# Patient Record
Sex: Male | Born: 1990 | Race: Black or African American
Health system: Southern US, Community
[De-identification: ages and names within clinical notes are randomized; demographics above are authoritative.]

## PROBLEM LIST (undated history)

## (undated) DIAGNOSIS — F1020 Alcohol dependence, uncomplicated: Secondary | ICD-10-CM

## (undated) DIAGNOSIS — F319 Bipolar disorder, unspecified: Secondary | ICD-10-CM

## (undated) DIAGNOSIS — Z9049 Acquired absence of other specified parts of digestive tract: Secondary | ICD-10-CM

## (undated) DIAGNOSIS — S069XAA Unspecified intracranial injury with loss of consciousness status unknown, initial encounter: Secondary | ICD-10-CM

## (undated) DIAGNOSIS — S069X9A Unspecified intracranial injury with loss of consciousness of unspecified duration, initial encounter: Secondary | ICD-10-CM

## (undated) HISTORY — PX: APPENDECTOMY (OPEN): SHX54

## (undated) HISTORY — PX: FACIAL FRACTURE SURGERY: SHX1570

---

## 2011-08-31 DIAGNOSIS — S0291XA Unspecified fracture of skull, initial encounter for closed fracture: Secondary | ICD-10-CM

## 2011-08-31 HISTORY — DX: Unspecified fracture of skull, initial encounter for closed fracture: S02.91XA

## 2012-04-30 DIAGNOSIS — S0990XS Unspecified injury of head, sequela: Secondary | ICD-10-CM

## 2012-04-30 DIAGNOSIS — H9192 Unspecified hearing loss, left ear: Secondary | ICD-10-CM

## 2012-04-30 DIAGNOSIS — S0292XA Unspecified fracture of facial bones, initial encounter for closed fracture: Secondary | ICD-10-CM

## 2012-04-30 DIAGNOSIS — J939 Pneumothorax, unspecified: Secondary | ICD-10-CM

## 2012-04-30 HISTORY — DX: Unspecified hearing loss, left ear: H91.92

## 2012-04-30 HISTORY — DX: Pneumothorax, unspecified: J93.9

## 2012-04-30 HISTORY — DX: Unspecified injury of head, sequela: S09.90XS

## 2012-04-30 HISTORY — DX: Unspecified fracture of facial bones, initial encounter for closed fracture: S02.92XA

## 2016-04-24 ENCOUNTER — Inpatient Hospital Stay: Payer: BLUE CROSS/BLUE SHIELD | Admitting: Internal Medicine

## 2016-04-24 ENCOUNTER — Emergency Department: Payer: BLUE CROSS/BLUE SHIELD

## 2016-04-24 ENCOUNTER — Inpatient Hospital Stay
Admission: EM | Admit: 2016-04-24 | Discharge: 2016-04-26 | DRG: 558 | Disposition: A | Discharge status: Home / Self Care | Payer: BLUE CROSS/BLUE SHIELD | Attending: Internal Medicine | Admitting: Internal Medicine

## 2016-04-24 DIAGNOSIS — F1290 Cannabis use, unspecified, uncomplicated: Secondary | ICD-10-CM | POA: Diagnosis present

## 2016-04-24 DIAGNOSIS — F17210 Nicotine dependence, cigarettes, uncomplicated: Secondary | ICD-10-CM | POA: Diagnosis present

## 2016-04-24 DIAGNOSIS — H9192 Unspecified hearing loss, left ear: Secondary | ICD-10-CM | POA: Diagnosis present

## 2016-04-24 DIAGNOSIS — I10 Essential (primary) hypertension: Secondary | ICD-10-CM | POA: Diagnosis present

## 2016-04-24 DIAGNOSIS — E872 Acidosis, unspecified: Secondary | ICD-10-CM

## 2016-04-24 DIAGNOSIS — F319 Bipolar disorder, unspecified: Secondary | ICD-10-CM | POA: Diagnosis present

## 2016-04-24 DIAGNOSIS — G43A0 Cyclical vomiting, not intractable: Secondary | ICD-10-CM | POA: Diagnosis present

## 2016-04-24 DIAGNOSIS — E860 Dehydration: Secondary | ICD-10-CM | POA: Diagnosis present

## 2016-04-24 DIAGNOSIS — R634 Abnormal weight loss: Secondary | ICD-10-CM | POA: Diagnosis present

## 2016-04-24 DIAGNOSIS — D72829 Elevated white blood cell count, unspecified: Secondary | ICD-10-CM | POA: Diagnosis present

## 2016-04-24 DIAGNOSIS — K5900 Constipation, unspecified: Secondary | ICD-10-CM | POA: Diagnosis not present

## 2016-04-24 DIAGNOSIS — Z87820 Personal history of traumatic brain injury: Secondary | ICD-10-CM

## 2016-04-24 DIAGNOSIS — M6282 Rhabdomyolysis: Principal | ICD-10-CM | POA: Diagnosis present

## 2016-04-24 DIAGNOSIS — N179 Acute kidney failure, unspecified: Secondary | ICD-10-CM | POA: Diagnosis present

## 2016-04-24 HISTORY — DX: Bipolar disorder, unspecified: F31.9

## 2016-04-24 LAB — COMPREHENSIVE METABOLIC PANEL
ALT: 35 U/L (ref 0–55)
AST (SGOT): 37 U/L — ABNORMAL HIGH (ref 5–34)
Albumin/Globulin Ratio: 1.3 (ref 0.9–2.2)
Albumin: 4.9 g/dL (ref 3.5–5.0)
Alkaline Phosphatase: 64 U/L (ref 38–106)
Anion Gap: 23 — ABNORMAL HIGH (ref 5.0–15.0)
BUN: 54 mg/dL — ABNORMAL HIGH (ref 9.0–28.0)
Bilirubin, Total: 2 mg/dL — ABNORMAL HIGH (ref 0.2–1.2)
CO2: 19 mEq/L — ABNORMAL LOW (ref 22–29)
Calcium: 10.4 mg/dL (ref 8.5–10.5)
Chloride: 99 mEq/L — ABNORMAL LOW (ref 100–111)
Creatinine: 8.7 mg/dL — ABNORMAL HIGH (ref 0.7–1.3)
Globulin: 3.9 g/dL — ABNORMAL HIGH (ref 2.0–3.6)
Glucose: 127 mg/dL — ABNORMAL HIGH (ref 70–100)
Potassium: 3.8 mEq/L (ref 3.5–5.1)
Protein, Total: 8.8 g/dL — ABNORMAL HIGH (ref 6.0–8.3)
Sodium: 141 mEq/L (ref 136–145)

## 2016-04-24 LAB — CBC AND DIFFERENTIAL
Absolute NRBC: 0 10*3/uL
Basophils Absolute Automated: 0.05 10*3/uL (ref 0.00–0.20)
Basophils Automated: 0.2 %
Eosinophils Absolute Automated: 0 10*3/uL (ref 0.00–0.70)
Eosinophils Automated: 0 %
Hematocrit: 49.3 % (ref 42.0–52.0)
Hgb: 17.5 g/dL — ABNORMAL HIGH (ref 13.0–17.0)
Immature Granulocytes Absolute: 0.19 10*3/uL — ABNORMAL HIGH
Immature Granulocytes: 0.7 %
Lymphocytes Absolute Automated: 1.53 10*3/uL (ref 0.50–4.40)
Lymphocytes Automated: 5.6 %
MCH: 30.6 pg (ref 28.0–32.0)
MCHC: 35.5 g/dL (ref 32.0–36.0)
MCV: 86.3 fL (ref 80.0–100.0)
MPV: 11.2 fL (ref 9.4–12.3)
Monocytes Absolute Automated: 1.64 10*3/uL — ABNORMAL HIGH (ref 0.00–1.20)
Monocytes: 6 %
Neutrophils Absolute: 23.9 10*3/uL — ABNORMAL HIGH (ref 1.80–8.10)
Neutrophils: 87.5 %
Nucleated RBC: 0 /100 WBC (ref 0.0–1.0)
Platelets: 297 10*3/uL (ref 140–400)
RBC: 5.71 10*6/uL (ref 4.70–6.00)
RDW: 13 % (ref 12–15)
WBC: 27.31 10*3/uL — ABNORMAL HIGH (ref 3.50–10.80)

## 2016-04-24 LAB — URINALYSIS, REFLEX TO MICROSCOPIC EXAM IF INDICATED
Bilirubin, UA: NEGATIVE
Glucose, UA: NEGATIVE
Ketones UA: NEGATIVE
Leukocyte Esterase, UA: NEGATIVE
Nitrite, UA: NEGATIVE
Protein, UR: 100 — AB
Specific Gravity UA: 1.017 (ref 1.001–1.035)
Urine pH: 5 (ref 5.0–8.0)
Urobilinogen, UA: NEGATIVE mg/dL

## 2016-04-24 LAB — URINALYSIS WITH MICROSCOPIC
Bilirubin, UA: NEGATIVE
Glucose, UA: 50 — AB
Ketones UA: NEGATIVE
Leukocyte Esterase, UA: NEGATIVE
Nitrite, UA: NEGATIVE
Protein, UR: 30 — AB
Specific Gravity UA: 1.016 (ref 1.001–1.035)
Urine pH: 5 (ref 5.0–8.0)
Urobilinogen, UA: NEGATIVE mg/dL

## 2016-04-24 LAB — RAPID DRUG SCREEN, URINE
Barbiturate Screen, UR: NEGATIVE
Benzodiazepine Screen, UR: NEGATIVE
Cannabinoid Screen, UR: POSITIVE — AB
Cocaine, UR: NEGATIVE
Opiate Screen, UR: POSITIVE — AB
PCP Screen, UR: NEGATIVE
Urine Amphetamine Screen: NEGATIVE

## 2016-04-24 LAB — SODIUM, URINE, RANDOM: Urine Sodium Random: 50 mEq/L

## 2016-04-24 LAB — BLOOD GAS, ARTERIAL
Arterial Total CO2: 18.5 mEq/L — ABNORMAL LOW (ref 24.0–30.0)
Base Excess, Arterial: -4.1 mEq/L — ABNORMAL LOW (ref <=2.0)
FIO2: 21 %
HCO3, Arterial: 21 mEq/L — ABNORMAL LOW (ref 23.0–29.0)
O2 Sat, Arterial: 96.8 % (ref 95.0–100.0)
Temperature: 37
pCO2, Arterial: 40.8 mmhg (ref 35.0–45.0)
pH, Arterial: 7.332 — ABNORMAL LOW (ref 7.350–7.450)
pO2, Arterial: 98.1 mmhg — ABNORMAL HIGH (ref 80.0–90.0)

## 2016-04-24 LAB — ETHANOL: Alcohol: NOT DETECTED mg/dL

## 2016-04-24 LAB — ECG 12-LEAD
Atrial Rate: 117 {beats}/min
P Axis: 81 degrees
P-R Interval: 116 ms
Q-T Interval: 354 ms
QRS Duration: 108 ms
QTC Calculation (Bezet): 493 ms
R Axis: 99 degrees
T Axis: 57 degrees
Ventricular Rate: 117 {beats}/min

## 2016-04-24 LAB — TSH
TSH: 0.93 u[IU]/mL (ref 0.35–4.94)
TSH: 0.97 u[IU]/mL (ref 0.35–4.94)

## 2016-04-24 LAB — C4 COMPLEMENT: C4 Complement: 26 mg/dL (ref 15.0–53.0)

## 2016-04-24 LAB — HEMOLYSIS INDEX
Hemolysis Index: 7 (ref 0–18)
Hemolysis Index: 21 — ABNORMAL HIGH (ref 0–18)

## 2016-04-24 LAB — C3 COMPLEMENT: C3 Complement: 115 mg/dL (ref 82–185)

## 2016-04-24 LAB — CK: Creatine Kinase (CK): 1773 U/L — ABNORMAL HIGH (ref 47–267)

## 2016-04-24 LAB — LIPASE: Lipase: 25 U/L (ref 8–78)

## 2016-04-24 LAB — PROTEIN / CREATININE RATIO, URINE
Urine Creatinine, Random: 210.2 mg/dL
Urine Protein Random: 25 mg/dL — ABNORMAL HIGH (ref 1.0–14.0)
Urine Protein/Creatinine Ratio: 0.1

## 2016-04-24 LAB — C-REACTIVE PROTEIN: C-Reactive Protein: 0.2 mg/dL (ref 0.0–0.8)

## 2016-04-24 LAB — GFR: EGFR: 9.1

## 2016-04-24 LAB — LACTIC ACID, PLASMA
Lactic Acid: 2.5 mmol/L — ABNORMAL HIGH (ref 0.2–2.0)
Lactic Acid: 0.9 mmol/L (ref 0.2–2.0)

## 2016-04-24 MED ORDER — SERTRALINE HCL 50 MG PO TABS
100.0000 mg | ORAL_TABLET | Freq: Every day | ORAL | Status: DC
Start: 2016-04-24 — End: 2016-04-26
  Administered 2016-04-24 – 2016-04-26 (×3): 100 mg via ORAL
  Filled 2016-04-24 (×3): qty 2

## 2016-04-24 MED ORDER — ONDANSETRON HCL 4 MG/2ML IJ SOLN
4.0000 mg | Freq: Once | INTRAMUSCULAR | Status: AC
Start: 2016-04-24 — End: 2016-04-24
  Administered 2016-04-24: 4 mg via INTRAVENOUS
  Filled 2016-04-24: qty 2

## 2016-04-24 MED ORDER — HYDRALAZINE HCL 20 MG/ML IJ SOLN
5.0000 mg | Freq: Three times a day (TID) | INTRAMUSCULAR | Status: DC | PRN
Start: 2016-04-24 — End: 2016-04-26

## 2016-04-24 MED ORDER — NALOXONE HCL 0.4 MG/ML IJ SOLN (WRAP)
0.2000 mg | INTRAMUSCULAR | Status: DC | PRN
Start: 2016-04-24 — End: 2016-04-26

## 2016-04-24 MED ORDER — MORPHINE SULFATE 15 MG PO TABS
15.0000 mg | ORAL_TABLET | ORAL | Status: DC | PRN
Start: 2016-04-24 — End: 2016-04-25
  Administered 2016-04-24 – 2016-04-25 (×3): 15 mg via ORAL
  Filled 2016-04-24 (×4): qty 1

## 2016-04-24 MED ORDER — SODIUM CHLORIDE 0.9 % IV BOLUS
30.0000 mL/kg | Freq: Once | INTRAVENOUS | Status: AC
Start: 2016-04-24 — End: 2016-04-24
  Administered 2016-04-24: 1584 mL via INTRAVENOUS

## 2016-04-24 MED ORDER — PROMETHAZINE HCL 25 MG/ML IJ SOLN
12.5000 mg | Freq: Four times a day (QID) | INTRAMUSCULAR | Status: DC | PRN
Start: 2016-04-24 — End: 2016-04-26

## 2016-04-24 MED ORDER — HEPARIN SODIUM (PORCINE) 5000 UNIT/ML IJ SOLN
5000.0000 [IU] | Freq: Two times a day (BID) | INTRAMUSCULAR | Status: DC
Start: 2016-04-24 — End: 2016-04-26
  Administered 2016-04-24 – 2016-04-26 (×5): 5000 [IU] via SUBCUTANEOUS
  Filled 2016-04-24 (×5): qty 1

## 2016-04-24 MED ORDER — PROMETHAZINE HCL 25 MG/ML IJ SOLN
12.5000 mg | Freq: Once | INTRAMUSCULAR | Status: AC
Start: 2016-04-24 — End: 2016-04-24
  Administered 2016-04-24: 12.5 mg via INTRAVENOUS
  Filled 2016-04-24: qty 1

## 2016-04-24 MED ORDER — QUETIAPINE FUMARATE 25 MG PO TABS
25.0000 mg | ORAL_TABLET | Freq: Every evening | ORAL | Status: DC
Start: 2016-04-24 — End: 2016-04-26
  Administered 2016-04-24 – 2016-04-25 (×2): 25 mg via ORAL
  Filled 2016-04-24 (×3): qty 1

## 2016-04-24 MED ORDER — HALOPERIDOL LACTATE 5 MG/ML IJ SOLN
2.0000 mg | Freq: Once | INTRAMUSCULAR | Status: AC
Start: 2016-04-24 — End: 2016-04-24
  Administered 2016-04-24: 2 mg via INTRAVENOUS
  Filled 2016-04-24: qty 1

## 2016-04-24 MED ORDER — ONDANSETRON HCL 4 MG/2ML IJ SOLN
4.0000 mg | Freq: Four times a day (QID) | INTRAMUSCULAR | Status: DC | PRN
Start: 2016-04-24 — End: 2016-04-26
  Administered 2016-04-25 – 2016-04-26 (×5): 4 mg via INTRAVENOUS
  Filled 2016-04-24 (×5): qty 2

## 2016-04-24 MED ORDER — METRONIDAZOLE IN NACL 500 MG/100 ML IV SOLN
500.0000 mg | Freq: Once | INTRAVENOUS | Status: AC
Start: 2016-04-24 — End: 2016-04-24
  Administered 2016-04-24: 500 mg via INTRAVENOUS
  Filled 2016-04-24: qty 100

## 2016-04-24 MED ORDER — ONDANSETRON HCL 4 MG/2ML IJ SOLN
4.0000 mg | Freq: Three times a day (TID) | INTRAMUSCULAR | Status: DC | PRN
Start: 2016-04-24 — End: 2016-04-24
  Administered 2016-04-24: 4 mg via INTRAVENOUS
  Filled 2016-04-24: qty 2

## 2016-04-24 MED ORDER — SODIUM CHLORIDE 0.9 % IV BOLUS
1000.0000 mL | Freq: Once | INTRAVENOUS | Status: AC
Start: 2016-04-24 — End: 2016-04-24
  Administered 2016-04-24: 1000 mL via INTRAVENOUS

## 2016-04-24 MED ORDER — SODIUM CHLORIDE 0.9 % IV SOLN
INTRAVENOUS | Status: DC
Start: 2016-04-24 — End: 2016-04-26

## 2016-04-24 MED ORDER — MORPHINE SULFATE 2 MG/ML IJ/IV SOLN (WRAP)
2.0000 mg | Status: DC | PRN
Start: 2016-04-24 — End: 2016-04-25
  Administered 2016-04-25 (×2): 2 mg via INTRAVENOUS
  Filled 2016-04-24 (×2): qty 1

## 2016-04-24 MED ORDER — MORPHINE SULFATE 4 MG/ML IJ/IV SOLN (WRAP)
4.0000 mg | Freq: Once | Status: AC
Start: 2016-04-24 — End: 2016-04-24
  Administered 2016-04-24: 4 mg via INTRAVENOUS
  Filled 2016-04-24: qty 1

## 2016-04-24 MED ORDER — LEVOFLOXACIN IN D5W 500 MG/100ML IV SOLN
500.0000 mg | Freq: Once | INTRAVENOUS | Status: AC
Start: 2016-04-24 — End: 2016-04-24
  Administered 2016-04-24: 500 mg via INTRAVENOUS
  Filled 2016-04-24: qty 100

## 2016-04-24 MED ORDER — ENOXAPARIN SODIUM 30 MG/0.3ML SC SOLN
30.0000 mg | Freq: Every day | SUBCUTANEOUS | Status: DC
Start: 2016-04-24 — End: 2016-04-24
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
## 2016-04-24 NOTE — ED Notes (Signed)
Pt comes in c/o V/N/D abd pain and epigastric pain that has been going on for few days.  Pt was just [place]'d from [place] but feels worse than when he went in.  Pt obs very weak unsteady and anxious.  Pt vomited during triage and it was pure bile.  States that that is what he has been doing all this time.
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
## 2016-04-24 NOTE — ED Notes (Addendum)
NURSING NOTE FOR THE RECEIVING INPATIENT NURSE     ED NURSE: Paralee Cancel NUMBER: [id number]   SPECTRA LINK NUMBER:  Admission Information      Joel [name]. is a 25 y.o. male admitted with a diagnosis of:    1. Non-traumatic rhabdomyolysis    2. Acute kidney injury    3. Dehydration    4. Lactic acidosis    5. Leukocytosis, unspecified type         Nursing Care      Level of Consciousness: Pt is A&O x4, although slightly confused when waking up.     ADL: Pt needs assistance toilleting. Mom is at the bedside.       Latest Vital Signs     Filed Vitals:    04/24/16 0910   BP: 162/91   Pulse: 88   Temp: 98.7 F (37.1 C)   Resp: 20   SpO2: 100%        IV Lines     Peripheral IV 04/24/16 Left Antecubital (Active)   Site Assessment Clean;Dry;Intact 04/24/2016  5:50 AM   Line Status Saline Locked;Flushed 04/24/2016  5:50 AM   Dressing Status Clean;Dry;Intact 04/24/2016  5:50 AM   Number of days:0       Peripheral IV 04/24/16 Right Antecubital (Active)   Site Assessment Clean;Dry;Intact 04/24/2016  6:21 AM   Line Status Saline Locked;Flushed 04/24/2016  6:21 AM   Dressing Status Clean;Intact;Dry 04/24/2016  6:21 AM   Number of days:0           Lab- Results     Labs Reviewed   CBC AND DIFFERENTIAL - Abnormal; Notable for the following:     WBC 27.31 (*)     Hgb 17.5 (*)     Neutrophils Absolute 23.90 (*)     Abs Mono Automated 1.64 (*)     Absolute Immature Granulocyte 0.19 (*)     All other components within normal limits   COMPREHENSIVE METABOLIC PANEL - Abnormal; Notable for the following:     Glucose 127 (*)     BUN 54.0 (*)     Creatinine 8.7 (*)     Chloride 99 (*)     CO2 19 (*)     Protein, Total 8.8 (*)     AST (SGOT) 37 (*)     Bilirubin, Total 2.0 (*)     Globulin 3.9 (*)     Anion Gap 23.0 (*)     All other components within normal limits   LACTIC ACID, PLASMA - Abnormal; Notable for the following:     Lactic acid 2.5 (*)     All other components within normal limits   URINALYSIS, REFLEX TO MICROSCOPIC EXAM IF  INDICATED - Abnormal; Notable for the following:     Color, UA Amber (*)     Clarity, UA Sl Cloudy (*)     Protein, UR 100 (*)     Blood, UA Moderate (*)     RBC, UA 11 - 25 (*)     Hyaline Casts, UA TNTC (*)     All other components within normal limits   RAPID DRUG SCREEN, URINE - Abnormal; Notable for the following:     Cannabinoid Screen, UR Positive (*)     Opiate Screen, UR Positive (*)     All other components within normal limits   BLOOD GAS, ARTERIAL - Abnormal; Notable for the following:     pH, Arterial 7.332 (*)  pO2, Arterial 98.1 (*)     HCO3, Arterial 21.0 (*)     Arterial Total CO2 18.5 (*)     Base Excess, Arterial -4.1 (*)     All other components within normal limits   CK - Abnormal; Notable for the following:     Creatine Kinase (CK) 1773 (*)     All other components within normal limits   HEMOLYSIS INDEX - Abnormal; Notable for the following:     Hemolysis Index 21 (*)     All other components within normal limits   CULTURE BLOOD AEROBIC AND ANAEROBIC    Narrative:     1 BLUE+1 PURPLE   URINE CULTURE   LIPASE   HEMOLYSIS INDEX   GFR   TSH   ETHANOL   LACTIC ACID, PLASMA   URINALYSIS WITH MICROSCOPIC   SODIUM, URINE, RANDOM   C3 COMPLEMENT   C4 COMPLEMENT   C-REACTIVE PROTEIN
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
## 2016-04-24 NOTE — Plan of Care (Signed)
Problem: Safety  Goal: Patient will be free from injury during hospitalization  Outcome: Progressing  High risk for falls, recent fall at home, steady gait, I person standby assistance, Safety/fall precautions in place, bed alarm in place, call bell at reach, encouraged to call for assistance at all times.  Will continue to monitor.    Problem: Pain  Goal: Pain at adequate level as identified by patient  Outcome: Progressing  C/o of mid sternal chest pain, morphine given with some relieve.  Will continue to monitor.     Comments:   The learning abilities of the patient and caregiver have been assessed. Today's individualized plan of care includes pain management, continue IV fluids for hydration,and safety precautions. The plan of care was discussed with the patient and caregiver, who agree to it and demonstrate understanding of the disease process, treatment plan, medications and consequences of noncompliance. All questions and concerns were addressed.  Safety maintained.  Will continue to monitor.

## 2016-04-24 NOTE — Consults (Signed)
Spoke to ER MD-  Reviewed labs remotely.   AKI with leukocytosis . Etiology - Pre-renal clinically but hematuria/proteinuria is suspicious and GN needs to be ruled out. No hydronephrosis on CT scan - Unlikely to be Rhabdomyolysis (CPK usually is more than 10 K to affect GFR - some reports at CPK 5 K as well esp., with underlying muscle diseases)   GI symptoms with some wt loss.   Lactic acidosis - mild with incRease AG   ? Polycythemia - likely volume depletion (hemoconcentration)       REC    Repeat UA  Check urine electrolytes  Check complements and CRP   IVF. Aggressive   Renal panel daily   Full note to follow once I see the patient.

## 2016-04-25 LAB — CBC
Absolute NRBC: 0 10*3/uL
Hematocrit: 43.3 % (ref 42.0–52.0)
Hgb: 14.6 g/dL (ref 13.0–17.0)
MCH: 31.1 pg (ref 28.0–32.0)
MCHC: 33.7 g/dL (ref 32.0–36.0)
MCV: 92.1 fL (ref 80.0–100.0)
MPV: 11.7 fL (ref 9.4–12.3)
Nucleated RBC: 0 /100 WBC (ref 0.0–1.0)
Platelets: 225 10*3/uL (ref 140–400)
RBC: 4.7 10*6/uL (ref 4.70–6.00)
RDW: 14 % (ref 12–15)
WBC: 18.19 10*3/uL — ABNORMAL HIGH (ref 3.50–10.80)

## 2016-04-25 LAB — GFR: EGFR: 42.2

## 2016-04-25 LAB — BASIC METABOLIC PANEL
Anion Gap: 8 (ref 5.0–15.0)
BUN: 32 mg/dL — ABNORMAL HIGH (ref 9.0–28.0)
CO2: 25 mEq/L (ref 22–29)
Calcium: 9.2 mg/dL (ref 8.5–10.5)
Chloride: 108 mEq/L (ref 100–111)
Creatinine: 2.3 mg/dL — ABNORMAL HIGH (ref 0.7–1.3)
Glucose: 91 mg/dL (ref 70–100)
Potassium: 4.1 mEq/L (ref 3.5–5.1)
Sodium: 141 mEq/L (ref 136–145)

## 2016-04-25 LAB — HEMOLYSIS INDEX: Hemolysis Index: 5 (ref 0–18)

## 2016-04-25 MED ORDER — MORPHINE SULFATE 2 MG/ML IJ/IV SOLN (WRAP)
1.0000 mg | Status: DC | PRN
Start: 2016-04-25 — End: 2016-04-26

## 2016-04-25 MED ORDER — MORPHINE SULFATE 15 MG PO TABS
15.0000 mg | ORAL_TABLET | ORAL | Status: DC | PRN
Start: 2016-04-25 — End: 2016-04-26
  Administered 2016-04-25 – 2016-04-26 (×2): 15 mg via ORAL
  Filled 2016-04-25 (×2): qty 1
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
## 2016-04-25 NOTE — Plan of Care (Signed)
Problem: Safety  Goal: Patient will be free from injury during hospitalization  Outcome: Progressing    04/25/16 0506   Goal/Interventions addressed this shift   Patient will be free from injury during hospitalization  Assess patient's risk for falls and implement fall prevention plan of care per policy;Provide and maintain safe environment;Use appropriate transfer methods;Ensure appropriate safety devices are available at the bedside;Include patient/ family/ care giver in decisions related to safety;Hourly rounding;Provide alternative method of communication if needed (communication boards, writing)         Problem: Pain  Goal: Pain at adequate level as identified by patient  Outcome: Progressing    04/25/16 0506   Goal/Interventions addressed this shift   Pain at adequate level as identified by patient Identify patient comfort function goal;Assess for risk of opioid induced respiratory depression, including snoring/sleep apnea. Alert healthcare team of risk factors identified.;Assess pain on admission, during daily assessment and/or before any "as needed" intervention(s);Reassess pain within 30-60 minutes of any procedure/intervention, per Pain Assessment, Intervention, Reassessment (AIR) Cycle;Evaluate if patient comfort function goal is met;Evaluate patient's satisfaction with pain management progress;Offer non-pharmacological pain management interventions;Include patient/patient care companion in decisions related to pain management as needed         Problem: Altered GI Function  Goal: Fluid and electrolyte balance are achieved/maintained  Outcome: Progressing    04/25/16 0506   Goal/Interventions addressed this shift   Fluid and electrolyte balance are achieved/maintained Monitor intake and output every shift;Monitor/assess lab values and report abnormal values;Provide adequate hydration;Monitor daily weight;Assess and reassess fluid and electrolyte status;Monitor for muscle weakness       Goal: Nutritional  intake is adequate  Outcome: Progressing    04/25/16 0506   Goal/Interventions addressed this shift   Nutritional intake is adequate Monitor daily weights;Assist patient with meals/food selection;Allow adequate time for meals;Encourage/perform oral hygiene as appropriate;Include patient/patient care companion in decisions related to nutrition         Comments:   Patient is alert and oriented with period of forgetful.High Risk for fall Implemented at all times as per Patient had Hx of fall at home.Patient complaints of abdominal pain. Morphine PO given with good effect.IVF cont .Patient had 1x episodes of nausea and vomitting.Zofran IV given with patient verbalized relief.

## 2016-04-25 NOTE — Progress Notes (Signed)
[facility]  PROGRESS NOTE      Patient: Joel [name].  Date: 04/25/2016   LOS: 1 Days  Admission Date: 04/24/2016   MRN: [id number]  Attending: Teodora Medici  MD, MPH                      Spectralink: [phone]                      Pager: [phone]     ASSESSMENT/PLAN     Joel [name]. is a 25 y.o. 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; still having intermittent N/V   -Zofran 4 mg IV every 8 h prn  -Hold antibiotics at this time  -IV hydration rate reduced  -Pain control with morphine IV dose adjusted  -Clear liquid diet for now     AKI -acute and most likely prerenal on account of severe dehydration with possible ATN  Cr is trending down rapidly  - IV fluid rate reduced to 125cc/hr  -Renal inputs appreciated  -Follow up BMP every 24 hours    Subacute weight loss- subacute and due to unknown causes; TSH is WNL  - We will discuss risk factors for sexually transmitted disease including HIV with the patient    Abnormal ECG- biatrial enlargement based on initial EKG  - Repeat EKG in the morning    TBI- chronic and stable  - No acute intervention at this time    Bipolar disorder  -c/w home dose of Zoloft 100 mg daily  -c/w Seroquel 25 mg daily at bedtime and 12.5 mg twice a day when necessary for agitation    Substance abuse  -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  - outpt f/u and drug rehab    Elevated BP  - Likely a combination of aggressive fluid hydration and abdominal pain  -If systolic blood pressure goes over 160 would recommend hydralazine 5 mg IV every 8 hours    Analgesia: morphine PO/IV    Nutrition: Clears    GI Prophylaxis: None    DVT Prophylaxis: heparin    Code Status: FULL    DISPO: anticipate 1-2 days pending continued improvement of the renal function.        SUBJECTIVE     Joel [name]. states that he  feels better today. Has had 2 episodes of vomiting today throwing up small quantities of bilious liquid.     MEDICATIONS     Current Facility-Administered Medications   Medication Dose Route Frequency   . heparin (porcine)  5,000 Units Subcutaneous Q12H [facility]   . QUEtiapine  25 mg Oral QHS   . sertraline  100 mg Oral Daily       PHYSICAL EXAM     Filed Vitals:    04/25/16 1413   BP: 163/86   Pulse:    Temp: 98 F (36.7 C)   Resp: 18   SpO2: 98%       Temperature: Temp  Min: 96.1 F (35.6 C)  Max: 98 F (36.7 C)  Pulse: Pulse  Min: 61  Max: 80  Respiratory: Resp  Min: 18  Max: 18  Non-Invasive BP: BP  Min: 110/59  Max: 163/86  Pulse Oximetry SpO2  Min: 98 %  Max: 100 %    Intake  and Output Summary (Last 24 hours) at Date Time    Intake/Output Summary (Last 24 hours) at 04/25/16 1518  Last data filed at 04/25/16 0440   Gross per 24 hour   Intake 5373.33 ml   Output   2800 ml   Net 2573.33 ml       Constitutional: He is oriented to person, place, and time.   Thin African-American male in no acute distress but requesting that he be allowed to shower to relieve his abdominal pain and nausea.   HENT:   Head: Normocephalic and atraumatic.   Healed tracheostomy scar.   Eyes: EOM are normal. Pupils are equal, round, and reactive to light.   Neck: Normal range of motion. Neck supple.   Cardiovascular: Normal rate, regular rhythm, normal heart sounds and intact distal pulses.   Pulmonary/Chest: Effort normal and breath sounds normal.   Abdominal: Soft. Bowel sounds are normal.   Periumbilical tenderness with no rigidity and rebound tenderness.   Musculoskeletal: Normal range of motion.   Neurological: He is alert and oriented to person, place, and time.   Skin: Skin is warm and dry.   Psychiatric: He has a normal mood and affect. His behavior is normal. Judgment and thought content normal.     LABS       Recent Labs  Lab 04/25/16  0437 04/24/16  0609   WBC 18.19* 27.31*   RBC 4.70 5.71   HGB 14.6 17.5*   HEMATOCRIT 43.3  49.3   MCV 92.1 86.3   PLATELETS 225 297         Recent Labs  Lab 04/25/16  0437 04/24/16  0609   SODIUM 141 141   POTASSIUM 4.1 3.8   CHLORIDE 108 99*   CO2 25 19*   BUN 32.0* 54.0*   CREATININE 2.3* 8.7*   GLUCOSE 91 127*   CALCIUM 9.2 10.4         Recent Labs  Lab 04/24/16  0609   ALT 35   AST (SGOT) 37*   BILIRUBIN, TOTAL 2.0*   ALBUMIN 4.9   ALKALINE PHOSPHATASE 64         Recent Labs  Lab 04/24/16  0609   CREATINE KINASE (CK) 1773*             Microbiology Results     Procedure Component Value Units Date/Time    Blood culture #1 [[id number]] Collected:  04/24/16 0611    Specimen Information:  Blood from Blood Updated:  04/25/16 1021    Narrative:      ORDER#: [id number]                                    ORDERED BY: Tawanna Sat  SOURCE: Blood Arm                                    COLLECTED:  04/24/16 06:11  ANTIBIOTICS AT COLL.:                                RECEIVED :  04/24/16 09:45  Culture Blood Aerobic and Anaerobic        PRELIM      04/25/16 10:21  04/25/16   No Growth after 1 day/s of incubation.  Urine culture [[id number]] Collected:  04/24/16 0740    Specimen Information:  Urine from Urine, Clean Catch Updated:  04/25/16 1212    Narrative:      ORDER#: [id number]                                    ORDERED BY: Tawanna Sat  SOURCE: Urine, Clean Catch                           COLLECTED:  04/24/16 07:40  ANTIBIOTICS AT COLL.:                                RECEIVED :  04/24/16 14:33  Culture Urine                              FINAL       04/25/16 12:12  04/25/16   No growth of >1,000 CFU/ML, No further work             I have personally reviewed the patient's labs, medications, and imaging    Signed,  Teodora Medici MD, MPH  3:18 PM 04/25/2016   Spectralink:[phone]  Pager: [phone]

## 2016-04-25 NOTE — Progress Notes (Addendum)
PROGRESS NOTE    Hospital Day: 1    Assessment:     Acute kidney injury.  Kidney and the etiology with an element of ATN as reflected by granular.  Hematuria resolved on repeat urinalysis.  Kidney function has improved significantly  Essential hypertension  Metabolic acidosis.  Nausea and vomiting    Plan:     Lower IV fluid rate to 125 mL per hour.  Antinausea medications.  Discussed with Dr Gaynelle Adu in detail      Subjective:     Patient seen today in his room.  He looks much better.  However, he continues to vomit    Review of Systems:   General:  no fever, no chills, no rigor, awake and alert, feeling better   HEENT: no neck pain, no throat pain  Endocrine: no fatigue   Respiratory: no cough, shortness of breath, or wheezing   Cardiovascular: no chest pain   Gastrointestinal: Abdominal pain is resolved but continues to have nausea and vomiting  Musculoskeletal: no edema  Neurological: no focal weakness  Dermatological: no rash, no ulcer    Physical Exam:     Filed Vitals:    04/25/16 0808   BP: 146/91   Pulse: 61   Temp: 96.1 F (35.6 C)   Resp: 18   SpO2: 100%       Intake and Output Summary (Last 24 hours) at Date Time    Intake/Output Summary (Last 24 hours) at 04/25/16 1350  Last data filed at 04/25/16 0440   Gross per 24 hour   Intake 6173.33 ml   Output   2800 ml   Net 3373.33 ml         General: No acute distress.  HEENT: No pallor.  Neck: No jugular venous distension.  Chest: Clear to auscultation.   CVS: Regular rate and rhythm. Normal S1S2. No murmur or rub.  Abdomen: Nontender, non distended. Positive bowel sounds.  No lower extremity edema.  No rash on limbs.     Scheduled Meds: Continuous Infusions:      heparin (porcine) 5,000 Units Subcutaneous Q12H [facility]   QUEtiapine 25 mg Oral QHS   sertraline 100 mg Oral Daily        . sodium chloride 200 mL/hr at 04/25/16 0030          Labs:       Recent Labs  Lab 04/25/16  0437 04/24/16  0609   WBC 18.19* 27.31*   HGB 14.6 17.5*   HEMATOCRIT 43.3 49.3   PLATELETS  225 297       Recent Labs  Lab 04/25/16  0437 04/24/16  0609   SODIUM 141 141   POTASSIUM 4.1 3.8   CHLORIDE 108 99*   CO2 25 19*   BUN 32.0* 54.0*   CREATININE 2.3* 8.7*   EGFR 42.2 9.1   GLUCOSE 91 127*   CALCIUM 9.2 10.4   ALBUMIN  --  4.9         URINE TYPE   Date Value Ref Range Status   04/24/2016 Clean Catch  Final     COLOR, UA   Date Value Ref Range Status   04/24/2016 Yellow Clear - Yellow Final     CLARITY, UA   Date Value Ref Range Status   04/24/2016 Hazy Clear - Hazy Final     SPECIFIC GRAVITY UA   Date Value Ref Range Status   04/24/2016 1.016 1.001-1.035 Final     URINE PH  Date Value Ref Range Status   04/24/2016 5.0 5.0-8.0 Final     NITRITE, UA   Date Value Ref Range Status   04/24/2016 Negative Negative Final     KETONES UA   Date Value Ref Range Status   04/24/2016 Negative Negative Final     UROBILINOGEN, UA   Date Value Ref Range Status   04/24/2016 Negative 0.2  -  2.0 mg/dL Final     BILIRUBIN, UA   Date Value Ref Range Status   04/24/2016 Negative Negative Final     BLOOD, UA   Date Value Ref Range Status   04/24/2016 Moderate* Negative Final     RBC, UA   Date Value Ref Range Status   04/24/2016 0 - 2 0 - 5 /hpf Final     WBC, UA   Date Value Ref Range Status   04/24/2016 0 - 5 0 - 5 /hpf Final     GRANULAR CASTS, UA   Date Value Ref Range Status   04/24/2016 3 - 5* None /lpf Final       Rads:     Radiology Results (24 Hour)     ** No results found for the last 24 hours. **        Radiological Procedure reviewed.          Cristal Ford, MD  04/25/2016

## 2016-04-25 NOTE — Progress Notes (Signed)
Patient complaints for chest pain and abd. Pain while PRIMARY RN  At lunch ,another RN assessed him took VS,ALL VS were nl,requested for O2 , STAFF TOLD PATIENT DOES NOT NEEDS IT NO.SHORTNESS OF BREATH AND O2 SAT.98% ON RA ,SUPERVISOR VICKY HELPED RN TO EXPLAINED TO THE PATIENT THAT HE DOES NEED O2.PATIENT WAS GIVEN PAIN MED. FOR THE CP AND .ABD.PAIN

## 2016-04-25 NOTE — Progress Notes (Signed)
DR.ZAMAN SPOKEN TO THE PATIENT'S MOTHER OVER THE PHONE AS PATIENT 'S MOTHER REQUESTED TO TALK TO THE DOCTOR,PATIENT ,FAMILY GOOD UNDERSTANDING WHY PATIENT HAVING CP AND WHY HE IS HERE .PATIENT BEEN ASKING WHY HE IS HERE RN BEEN EXPLAINING TO THE PATIENT THE POC,TX AND DX, BUT KEEPS TELLING RN REPEATEDLY THAT RN NOT EXPLAINING AND TELLING MOTHER ABOUT NOT UNDERSTANDING THE TX ABD DX.AFTER TALKING AGAIN TO THE DOCTOR SEEMS THAT UNDERSTOOD WELL HIS CONDITION.

## 2016-04-25 NOTE — Plan of Care (Signed)
Problem: Pain  Goal: Pain at adequate level as identified by patient  Outcome: Progressing    Comments:   Continued medicating patient with MORPHINE IV   every 4 HRS. as per pt complaints for pain 5-6/10 SCALE LEVEL, as well as nausea given ZOFRAN.Complaints pain on his abd.,mid level.vomited 2x 0n my shift wth yellowish ,bile-ish color small amt..Patient  Drowsy after receiving pain med., advised not to get impulsively to go the bathroom,call for help (call bell), cont. On IVF INFUSION.Patient been getting up impulsively without waiting for the staff,even the bed alarm is but still immediatety able to get out of bed ,before staff arirved in the room.DR Gaynelle Adu explained to the patient as well about safety.fall assessment is assessed and completed the WB.ALL safety measures were in placed as well,hourly rounding been happening.PLAN for pain management  c hanged IV pain med. Med to oral MORPHINE and decreased the dose of IV MED.MORPHINE.Will cont.assessing pain level.

## 2016-04-26 LAB — CBC
Absolute NRBC: 0 10*3/uL
Hematocrit: 37.1 % — ABNORMAL LOW (ref 42.0–52.0)
Hgb: 12.7 g/dL — ABNORMAL LOW (ref 13.0–17.0)
MCH: 31.5 pg (ref 28.0–32.0)
MCHC: 34.2 g/dL (ref 32.0–36.0)
MCV: 92.1 fL (ref 80.0–100.0)
MPV: 10.7 fL (ref 9.4–12.3)
Nucleated RBC: 0 /100 WBC (ref 0.0–1.0)
Platelets: 204 10*3/uL (ref 140–400)
RBC: 4.03 10*6/uL — ABNORMAL LOW (ref 4.70–6.00)
RDW: 13 % (ref 12–15)
WBC: 12.78 10*3/uL — ABNORMAL HIGH (ref 3.50–10.80)

## 2016-04-26 LAB — HEMOLYSIS INDEX: Hemolysis Index: 9 (ref 0–18)

## 2016-04-26 LAB — BASIC METABOLIC PANEL
Anion Gap: 8 (ref 5.0–15.0)
BUN: 15 mg/dL (ref 9.0–28.0)
CO2: 22 mEq/L (ref 22–29)
Calcium: 8.9 mg/dL (ref 8.5–10.5)
Chloride: 108 mEq/L (ref 100–111)
Creatinine: 1.1 mg/dL (ref 0.7–1.3)
Glucose: 91 mg/dL (ref 70–100)
Potassium: 4 mEq/L (ref 3.5–5.1)
Sodium: 138 mEq/L (ref 136–145)

## 2016-04-26 LAB — HIV AG/AB 4TH GENERATION: HIV Ag/Ab, 4th Generation: NONREACTIVE

## 2016-04-26 LAB — GFR: EGFR: >60

## 2016-04-26 MED ORDER — MORPHINE SULFATE 15 MG PO TABS
15.0000 mg | ORAL_TABLET | ORAL | Status: DC | PRN
Start: 2016-04-26 — End: 2017-06-04

## 2016-04-26 MED ORDER — LACTULOSE 10 GM/15ML PO SOLN
20.0000 g | Freq: Two times a day (BID) | ORAL | Status: DC
Start: 2016-04-26 — End: 2016-04-26
  Administered 2016-04-26: 20 g via ORAL
  Filled 2016-04-26: qty 30

## 2016-04-26 MED ORDER — ONDANSETRON 4 MG PO TBDP
4.0000 mg | ORAL_TABLET | Freq: Three times a day (TID) | ORAL | Status: DC | PRN
Start: 2016-04-26 — End: 2017-06-04

## 2016-04-26 MED ORDER — FAMOTIDINE 20 MG PO TABS
20.0000 mg | ORAL_TABLET | Freq: Two times a day (BID) | ORAL | Status: DC
Start: 2016-04-26 — End: 2017-06-04

## 2016-04-26 NOTE — Progress Notes (Signed)
RENAL PROGRESS NOTE    Date Time: 04/26/2016 10:27 AM  Patient Name: Joel [name], Joel [name].  Attending Physician: Teodora Medici, MD    Assessment:   Acute kidney injury, prerenal state, renal function has recovered  Essential hypertension, blood pressure is better but still above target  Metabolic acidosis, corrected.  Nausea and vomiting, improved    Plan:   [place] IV fluids  Encouraged oral fluids  Laxatives/stool softeners  Continue other current medication  Okay for discharge from my viewpoint  No plans for follow-up in my office    Subjective:   Feels better  Still weak  No particular symptoms other than constipation  No nausea or vomiting today  Gaining more fluids orally  Maintaining good urine output    Review of Systems:   No fever.  No cough.  No chest pain   No shortness of breath  No urinary complaints.  No vomiting or diarrhea.  No abdominal pain.  No headache.  No swelling in legs.    Meds:     . heparin (porcine)  5,000 Un Subcutaneous Q12H [facility]   . QUEtiapine  25 mg Oral QHS   . sertraline  100 mg Oral Daily     Physical Exam:     BP 154/90 mmHg  Pulse 64  Temp(Src) 98.6 F (37 C) (Oral)  Resp 16  Ht 1.702 m (5\' 7" )  Wt 55.6 kg (122 lb 9.2 oz)  BMI 19.19 kg/m2  SpO2 99%    Intake 2623.34 ml   Output    700 ml   Net 1923.34 ml     Alert and oriented, non-dyspneic.  HEENT is unremarkable.  Well-hydrated.  JVP is not raised.  Lungs are clear.  Heart sounds are unchanged.  No new murmur.  Abdomen is soft, nontender, bowel sounds are normal.  No focal neurological signs.  No new findings on skin or joints.  No peripheral edema.    Labs:        04/26/16   0339  04/25/16   0437   WBC  12.78*  18.19*   HGB  12.7*  14.6   HEMATOCRIT  37.1*  43.3   PLATELETS  204  225     SODIUM  138  141   POTASSIUM  4.0  4.1   CHLORIDE  108  108   CO2  22  25   BUN  15.0  32.0*   CREATININE  1.1  2.3*   GLUCOSE  91  91   CALCIUM  8.9  9.2     AST (SGOT)  37*   ALT  35   ALKALINE PHOSPHATASE  64   PROTEIN, TOTAL  8.8*    ALBUMIN  4.9       Signed by: Nancy Nordmann, MD  [phone]

## 2016-04-26 NOTE — Progress Notes (Signed)
Cm spoke with Joel [name] re: discharge plan. Joel [name] is admitted from home due to dehydration, lactic acidosis, and acute kidney injury.  Joel [name] lives alone and independent pta.  Joel [name] has good support, mother.  No past HH or SNf.  Joel [name] reported he sees Dr. Lew Dawes from [place].  DCP is likely home with no needs and mother to transport.  CM will continue to f/u with needs prn.     04/26/16 1430   Patient Type   Within 30 Days of Previous Admission? No   Healthcare Decisions   Interviewed: Patient   Orientation/Decision Making Abilities of Patient Alert and Oriented x3, able to make decisions   Advance Directive Patient has advance directive, copy not in chart   Healthcare Agent Appointed Other (Comment)  (lavora Better, mother, [phone], [phone])   Prior to admission   Prior level of function Independent with ADLs;Ambulates independently   Type of Residence Private residence   Home Layout One level  (apt with elevator)   Have running water, electricity, heat, etc? Yes   Living Arrangements Alone   How do you get to your MD appointments? drives   How do you get your groceries? drives   Who fixes your meals? Joel [name]   Who does your laundry? Joel [name]   Who picks up your prescriptions? Joel [name]   Dressing Independent   Grooming Independent   Feeding Independent   Bathing Independent   Toileting Independent   DME Currently at [employer] wheel walker   Home Care/Community Services None   Prior SNF admission? (Detail) none   Prior Rehab admission? (Detail) none   Adult Protective Services (APS) involved? No   Discharge Planning   Patient expects to be discharged to: home   Anticipated [place] plan discussed with: Same as interviewed   Potential barriers to discharge: (none)   Mode of transportation: Private car (family member)   Consults/Providers   Correct PCP listed in Epic? Yes  (Dr. Neomia Glass from [place], Joel [name] does not have a phone number or Md's first name.)   Important Message from [facility] Notice   Patient received 1st IMM Letter? n/a   Janie Morning  Case Management  [phone]

## 2016-04-26 NOTE — Discharge Instructions (Signed)
Dehydration (Adult)  Dehydration occurs when your body loses too much fluid. This may be the result of prolonged vomiting or diarrhea, excessive sweating, or a high fever. It may also happen if you don't drink enough fluid when you're sick or out in the heat. Misuse of diuretics (water pills) can also be a cause.  Symptoms include thirst and decreased urine output. You may also feel dizzy, weak, fatigued, or very drowsy. The diet described below is usually enough to treat dehydration. In some cases, you may need medicine.  Home care   Drink at least 12 8-ounce glasses of fluid every day to resolve the dehydration. Fluid may include water; orange juice; lemonade; apple, grape, or cranberry juice; clear fruit drinks; electrolyte replacement and sports drinks; and teas and coffee without caffeine. If you have been diagnosed with a kidney disease, ask your doctor how much and what types of fluids you should drink to prevent dehydration. If you have kidney disease, fluid can build up in the body. This can be dangerous to your health.   If you have a fever, muscle aches, or a headache as a result of a cold or flu, you may take acetaminophen or ibuprofen, unless another medicine was prescribed. If you have chronic liver or kidney disease, or have ever had a stomach ulcer or gastrointestinal bleeding, talk with your health care provider before using these medicines. Don't take aspirin if you are younger than 18 and have a fever. Aspirin raises the chance for severe liver injury.  Follow-up care  Follow up with your health care provider, or as advised.  When to seek medical advice  Call your health care provider right away if any of these occur:   Continued vomiting   Frequent diarrhea (more than 5 times a day); blood (red or black color) or mucus in diarrhea   Blood in vomit or stool   Swollen abdomen or increasing abdominal pain   Weakness, dizziness, or fainting   Unusual drowsiness or confusion   Reduced urine  output or extreme thirst   Fever of 100.30F (34C) or higher  Date Last Reviewed: 01/27/2014   2000-2016 [employer] [address], [place], [place] [address]. All rights reserved. This information is not intended as a substitute for professional medical care. Always follow your healthcare professional's instructions.      [facility] DISCHARGE INSTRUCTIONS     Date of Admission: 04/24/2016    Date of Discharge: 04/26/2016      Consultants: renal     Pending Studies: None    Further Instructions: Please follow up with the providers/clinics listed below.     Surgery/Procedure: None    Admission Diagnosis: Dehydration [E86.0]  Lactic acidosis [E87.2]  Acute kidney injury [N17.9]  Non-traumatic rhabdomyolysis [M62.82]  Leukocytosis, unspecified type [D72.829]    Problem List  Patient Active Problem List   Diagnosis   . Non-traumatic rhabdomyolysis   . Cyclical vomiting with nausea   . Bipolar disorder         Discharge Medications     Medication List      START taking these medications          famotidine 20 MG tablet   Commonly known as:  PEPCID   Take 1 tablet (20 mg total) by mouth every 12 (twelve) hours.       morphine 15 MG immediate release tablet   Commonly known as:  MSIR   Take 1 tablet (15 mg total) by mouth  every 4 (four) hours as needed for Pain.         CONTINUE taking these medications          acetaminophen 325 MG tablet   Commonly known as:  TYLENOL       ondansetron 4 MG disintegrating tablet   Commonly known as:  ZOFRAN-ODT   Take 1 tablet (4 mg total) by mouth every 8 (eight) hours as needed for Nausea.       QUEtiapine 25 MG tablet   Commonly known as:  SEROquel       sertraline 100 MG tablet   Commonly known as:  ZOLOFT       simethicone 80 MG chewable tablet   Commonly known as:  MYLICON            Where to Get Your Medications      You can get these medications from any pharmacy     Bring a paper prescription for each of these medications    - morphine 15 MG immediate  release tablet  - ondansetron 4 MG disintegrating tablet      Information about where to get these medications is not yet available     ! Ask your nurse or doctor about these medications    - famotidine 20 MG tablet          Follow up Appointments        Follow-up Information     Follow up with Mady Haagensen, MD. Schedule an appointment as soon as possible for a visit in 3 days.    Specialty:  Internal Medicine    Why:  Primary     Contact information:    [address]  [address]  [place] [address]  [phone]          Call Doristine Section, Varagur Orland Jarred, MD.    Specialties:  Nephrology, Internal Medicine, Clinical Informatics    Why:  As needed for follow up     Contact information:    [address]  [place] [address]  [phone]              Call Your Doctor if you have:   Chest pain   Shortness of breath or difficulty breathing   Temperature greater than 100.4   Persistent nausea and vomiting   Severe uncontrolled pain   Signs of infection (pain, swelling, redness, tenderness, odor, or green/yellow discharge)   Headache or visual disturbances   Hives   Persistent dizziness or light-headedness   Extreme fatigue   Bleeding   Any other questions or concerns you may have after discharge   In an emergency, call 911 or go to an Emergency Department at a nearby hospital     Additional Instructions:   -Schedule a follow up appointment with your Primary Care Provider within 1 week of discharge.   -Carry a list of your medications and allergies with you at all times   -Resume your usual diet unless otherwise directed. Good nutrition promotes quicker healing.   -Call your pharmacy at least 1 week in advance to refill prescriptions     Information on Taking Medicine Safely   Medicine is given to help treat or prevent illness. But if you don't take it correctly, it might not help. It might even harm you. Your doctor or pharmacist can help you learn the right way to take your medicine. Listed below are some tips to  help you take medicine safely.  Safety Tips   1. Have a routine for taking each medicine. Make it part of something you do each day, such as brushing your teeth or eating a meal.   2. When you go to the hospital or your doctor's office, bring all your current medicines in their original boxes or bottles. If you can't do that, bring an up-to-date list of your medicines.   3. Do not stop taking a prescription medicine unless your doctor tells you to. Doing so could make your condition worse.   4. Do not share medicines.   5. Let your doctor and pharmacist know of any allergies you have.   6. Taking prescription medicines with alcohol, street drugs, herbs, supplements, or even some over-the-counter medicines can be harmful. Talk to your doctor or pharmacist before using any of these things while taking a prescription medcine.   7. When filling your prescriptions, try using the same pharmacy for all your medicines. If not, let the pharmacist know what medicines you are already taking.   8. Keep medicines out of the reach of children and pets. Store medicines in a cool, dry, dark place--not in the bathroom or in the kitchen near moisture or heat.   9. Do not use medicine that has expired or that doesn't look or smell right. Bring expired or old medicine to your pharmacy for disposal.     Using Generic Medicines   Medicines have brand names and generic (chemical) names. When a medicine is first made, it is sold only under its brand name. Later, it can be made and sold as a generic. Generic medicines cost less than brand-name medicines and most work just as well. Unless their doctor says otherwise, most people can use the generic medicine instead of the brand-name medicine.     Always follow your healthcare professional's instructions.

## 2016-04-26 NOTE — Progress Notes (Signed)
Pt discharged to home after clearance from nephrology and Dr. Gaynelle Adu. Discharge paperwork, follow up information, and medications reviewed with patient prior to discharge, he verbalized understanding. IV and tele removed prior to discharge. Pt's mother provided transport home. Pt ambulated to the patient lobby with all possessions.

## 2016-04-26 NOTE — Plan of Care (Signed)
Problem: Safety  Goal: Patient will be free from injury during hospitalization  Outcome: Progressing    04/26/16 1545   Goal/Interventions addressed this shift   Patient will be free from injury during hospitalization  Assess patient's risk for falls and implement fall prevention plan of care per policy;Provide and maintain safe environment;Use appropriate transfer methods;Ensure appropriate safety devices are available at the bedside;Hourly rounding   Pt reminded to call for assist to bathroom. Pt still refuses to listen and will walk up and down the hall as he pleases. Pt given full liquid diet will monitor pt to see if he tolaraates food wthout nausea.    Problem: Pain  Goal: Pain at adequate level as identified by patient  Outcome: Not Progressing    04/26/16 1545   Goal/Interventions addressed this shift   Pain at adequate level as identified by patient Identify patient comfort function goal;Assess for risk of opioid induced respiratory depression, including snoring/sleep apnea. Alert healthcare team of risk factors identified.;Assess pain on admission, during daily assessment and/or before any "as needed" intervention(s)   Pt c/o adbdominal pain of 3/10. No c./o of pain at this time. Has just showere for the second time today.

## 2016-04-26 NOTE — Progress Notes (Addendum)
Nutritional Support Services  Nutrition Brief Note    Joel [name]. 25 y.o. male   MRN: [id number]    Nutrition Consult received for >45lb wt loss in 3 months. Nutrition Assessment completed on 8/26. Please see note for details.     Nutrition Risk Level: High (will follow up within 5 days and PRN)     Concepcion Living, MS RDN   [phone]

## 2016-04-26 NOTE — Plan of Care (Signed)
Problem: Moderate/High Fall Risk Score >5  Goal: Patient will remain free of falls  Outcome: Progressing  Patient alert and oriented x4, vital signs stable, patient advised to use call button for help, fall precaution, call lights within reach and hourly rounding in progress.    Problem: Pain  Goal: Pain at adequate level as identified by patient  Outcome: Progressing    04/26/16 0140   Goal/Interventions addressed this shift   Pain at adequate level as identified by patient Assess for risk of opioid induced respiratory depression, including snoring/sleep apnea. Alert healthcare team of risk factors identified.;Identify patient comfort function goal;Assess pain on admission, during daily assessment and/or before any "as needed" intervention(s);Evaluate patient's satisfaction with pain management progress;Reassess pain within 30-60 minutes of any procedure/intervention, per Pain Assessment, Intervention, Reassessment (AIR) Cycle;Evaluate if patient comfort function goal is met   Patient complained of abdominal pain and nausea, received PO morphine and 4mg  zofran as needed. Tolerated all med's. Will continue with plan of care.
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
## 2016-05-03 ENCOUNTER — Emergency Department
Admission: EM | Admit: 2016-05-03 | Discharge: 2016-05-03 | Disposition: A | Discharge status: Home / Self Care | Payer: BLUE CROSS/BLUE SHIELD | Attending: Emergency Medicine | Admitting: Emergency Medicine

## 2016-05-03 ENCOUNTER — Emergency Department: Payer: BLUE CROSS/BLUE SHIELD

## 2016-05-03 DIAGNOSIS — R1013 Epigastric pain: Secondary | ICD-10-CM | POA: Insufficient documentation

## 2016-05-03 DIAGNOSIS — R1115 Cyclical vomiting syndrome unrelated to migraine: Secondary | ICD-10-CM

## 2016-05-03 DIAGNOSIS — G43A0 Cyclical vomiting, not intractable: Secondary | ICD-10-CM | POA: Insufficient documentation

## 2016-05-03 DIAGNOSIS — F17210 Nicotine dependence, cigarettes, uncomplicated: Secondary | ICD-10-CM | POA: Insufficient documentation

## 2016-05-03 LAB — COMPREHENSIVE METABOLIC PANEL
ALT: 42 U/L (ref 0–55)
AST (SGOT): 31 U/L (ref 5–34)
Albumin/Globulin Ratio: 1.2 (ref 0.9–2.2)
Albumin: 4.2 g/dL (ref 3.5–5.0)
Alkaline Phosphatase: 57 U/L (ref 38–106)
Anion Gap: 13 (ref 5.0–15.0)
BUN: 8 mg/dL — ABNORMAL LOW (ref 9.0–28.0)
Bilirubin, Total: 0.8 mg/dL (ref 0.2–1.2)
CO2: 24 mEq/L (ref 22–29)
Calcium: 10.9 mg/dL — ABNORMAL HIGH (ref 8.5–10.5)
Chloride: 101 mEq/L (ref 100–111)
Creatinine: 1.1 mg/dL (ref 0.7–1.3)
Globulin: 3.5 g/dL (ref 2.0–3.6)
Glucose: 121 mg/dL — ABNORMAL HIGH (ref 70–100)
Potassium: 4.4 mEq/L (ref 3.5–5.1)
Protein, Total: 7.7 g/dL (ref 6.0–8.3)
Sodium: 138 mEq/L (ref 136–145)

## 2016-05-03 LAB — TYPE AND SCREEN
AB Screen Gel: NEGATIVE
ABO Rh: B POS

## 2016-05-03 LAB — CBC AND DIFFERENTIAL
Absolute NRBC: 0 10*3/uL
Basophils Absolute Automated: 0.05 10*3/uL (ref 0.00–0.20)
Basophils Automated: 0.2 %
Eosinophils Absolute Automated: 0 10*3/uL (ref 0.00–0.70)
Eosinophils Automated: 0 %
Hematocrit: 41.1 % — ABNORMAL LOW (ref 42.0–52.0)
Hgb: 14.1 g/dL (ref 13.0–17.0)
Immature Granulocytes Absolute: 0.1 10*3/uL — ABNORMAL HIGH
Immature Granulocytes: 0.5 %
Lymphocytes Absolute Automated: 0.88 10*3/uL (ref 0.50–4.40)
Lymphocytes Automated: 4.3 %
MCH: 31.4 pg (ref 28.0–32.0)
MCHC: 34.3 g/dL (ref 32.0–36.0)
MCV: 91.5 fL (ref 80.0–100.0)
MPV: 10.7 fL (ref 9.4–12.3)
Monocytes Absolute Automated: 0.51 10*3/uL (ref 0.00–1.20)
Monocytes: 2.5 %
Neutrophils Absolute: 19.13 10*3/uL — ABNORMAL HIGH (ref 1.80–8.10)
Neutrophils: 92.5 %
Nucleated RBC: 0 /100 WBC (ref 0.0–1.0)
Platelets: 356 10*3/uL (ref 140–400)
RBC: 4.49 10*6/uL — ABNORMAL LOW (ref 4.70–6.00)
RDW: 14 % (ref 12–15)
WBC: 20.67 10*3/uL — ABNORMAL HIGH (ref 3.50–10.80)

## 2016-05-03 LAB — HEMOLYSIS INDEX: Hemolysis Index: 16 (ref 0–18)

## 2016-05-03 LAB — GFR: EGFR: >60

## 2016-05-03 LAB — LIPASE: Lipase: 37 U/L (ref 8–78)

## 2016-05-03 MED ORDER — KETOROLAC TROMETHAMINE 30 MG/ML IJ SOLN
15.0000 mg | Freq: Once | INTRAMUSCULAR | Status: AC
Start: 2016-05-03 — End: 2016-05-03
  Administered 2016-05-03: 15 mg via INTRAVENOUS
  Filled 2016-05-03: qty 1

## 2016-05-03 MED ORDER — HALOPERIDOL LACTATE 5 MG/ML IJ SOLN
5.0000 mg | Freq: Once | INTRAMUSCULAR | Status: AC
Start: 2016-05-03 — End: 2016-05-03
  Administered 2016-05-03: 5 mg via INTRAMUSCULAR
  Filled 2016-05-03: qty 1

## 2016-05-03 MED ORDER — PROMETHAZINE HCL 25 MG PO TABS
25.0000 mg | ORAL_TABLET | Freq: Four times a day (QID) | ORAL | Status: DC | PRN
Start: 2016-05-03 — End: 2017-03-28

## 2016-05-03 MED ORDER — MORPHINE SULFATE 10 MG/ML IJ/IV SOLN (WRAP)
6.0000 mg | Freq: Once | Status: AC
Start: 2016-05-03 — End: 2016-05-03
  Administered 2016-05-03: 6 mg via INTRAVENOUS
  Filled 2016-05-03: qty 1

## 2016-05-03 MED ORDER — ONDANSETRON HCL 4 MG/2ML IJ SOLN
4.0000 mg | Freq: Once | INTRAMUSCULAR | Status: AC
Start: 2016-05-03 — End: 2016-05-03
  Administered 2016-05-03: 4 mg via INTRAVENOUS
  Filled 2016-05-03: qty 2

## 2016-05-03 MED ORDER — SODIUM CHLORIDE 0.9 % IV BOLUS
1000.0000 mL | Freq: Once | INTRAVENOUS | Status: AC
Start: 2016-05-03 — End: 2016-05-03
  Administered 2016-05-03: 1000 mL via INTRAVENOUS

## 2016-05-03 NOTE — ED Notes (Signed)
Discharged by MD

## 2016-05-03 NOTE — Discharge Instructions (Signed)
Dear Joel [name].:     Thank you for choosing the [facility] Emergency Department for your healthcare needs. It was a privilege caring for you. We are genuinely concerned about your health and comfort.     Below is some information our patients often find helpful.    Instructions:     We recommend taking Tylenol as needed for discomfort.      It is critical that you return immediately or follow up with your doctor if your symptoms worsen, including:  You have worsening pain.  You are unable to keep food down.  You have fevers greater than 101.4 degrees.  You have any concerns.    We recommend checking GoodRx.com to find coupons to lower the cost of your prescriptions.    You will likely be receiving a survey about the care you received in the emergency department. It would mean a lot to our care team if you could fill that out and return the form. We're always looking to improve and your feedback will help [place] do that.    We wish you good health. Please do not hesitate to contact me if I can be of assistance. We aim to provide excellent care.     Sincerely,   Cloyd Stagers. Meryl Crutch, MD FACEP  [email or web address]  [facility]  [phone]        Abdominal Pain    You have been diagnosed with abdominal (belly) pain. The cause of your pain is not yet known.    Many things can cause abdominal pain. Examples include viral infections and bowel (intestine) spasms. You might need another examination or more tests to find out why you have pain.    At this time, your pain does not seem to be caused by anything dangerous. You do not need surgery. You do not need to stay in the hospital.     Though we don't believe your condition is dangerous right now, it is important to be careful. Sometimes a problem that seems mild can become serious later. This is why it is very important that you return here or go to the nearest Emergency Department unless you are 100% improved.    Return here or  go to the nearest Emergency Department, or follow up with your physician in:   24 hours.    Drink only clear liquids such as water, clear broth, sports drinks, or clear caffeine-free soft drinks, like 7-Up or Sprite, for the next:   24 hours.    YOU SHOULD SEEK MEDICAL ATTENTION IMMEDIATELY, EITHER HERE OR AT THE NEAREST EMERGENCY DEPARTMENT, IF ANY OF THE FOLLOWING OCCURS:   Your pain does not go away or gets worse.   You cannot keep fluids down or your vomit is dark green.    You vomit blood or see blood in your stool. Blood might be bright red or dark red. It can also be black and look like tar.   You have a fever (temperature higher than 100.19F / 38C) or shaking chills.   Your skin or eyes look yellow or your urine looks brown.   You have severe diarrhea.
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
## 2016-05-04 ENCOUNTER — Telehealth: Payer: Self-pay | Admitting: Emergency Medicine

## 2016-05-04 NOTE — Telephone Encounter (Signed)
Left message

## 2016-06-23 ENCOUNTER — Emergency Department: Payer: BLUE CROSS/BLUE SHIELD

## 2016-06-23 ENCOUNTER — Emergency Department
Admission: EM | Admit: 2016-06-23 | Discharge: 2016-06-23 | Disposition: A | Discharge status: Home / Self Care | Payer: BLUE CROSS/BLUE SHIELD | Attending: Internal Medicine | Admitting: Internal Medicine

## 2016-06-23 DIAGNOSIS — R1011 Right upper quadrant pain: Secondary | ICD-10-CM | POA: Insufficient documentation

## 2016-06-23 DIAGNOSIS — D72829 Elevated white blood cell count, unspecified: Secondary | ICD-10-CM | POA: Insufficient documentation

## 2016-06-23 DIAGNOSIS — J020 Streptococcal pharyngitis: Secondary | ICD-10-CM | POA: Insufficient documentation

## 2016-06-23 DIAGNOSIS — F1210 Cannabis abuse, uncomplicated: Secondary | ICD-10-CM

## 2016-06-23 DIAGNOSIS — F17210 Nicotine dependence, cigarettes, uncomplicated: Secondary | ICD-10-CM | POA: Insufficient documentation

## 2016-06-23 DIAGNOSIS — R1110 Vomiting, unspecified: Secondary | ICD-10-CM | POA: Insufficient documentation

## 2016-06-23 LAB — COMPREHENSIVE METABOLIC PANEL
ALT: 9 U/L (ref 0–55)
AST (SGOT): 18 U/L (ref 5–34)
Albumin/Globulin Ratio: 1.1 (ref 0.9–2.2)
Albumin: 4.2 g/dL (ref 3.5–5.0)
Alkaline Phosphatase: 81 U/L (ref 38–106)
Anion Gap: 13 (ref 5.0–15.0)
BUN: 18 mg/dL (ref 9.0–28.0)
Bilirubin, Total: 0.8 mg/dL (ref 0.2–1.2)
CO2: 22 mEq/L (ref 22–29)
Calcium: 10 mg/dL (ref 8.5–10.5)
Chloride: 103 mEq/L (ref 100–111)
Creatinine: 1.5 mg/dL — ABNORMAL HIGH (ref 0.7–1.3)
Globulin: 3.9 g/dL — ABNORMAL HIGH (ref 2.0–3.6)
Glucose: 88 mg/dL (ref 70–100)
Potassium: 4.8 mEq/L (ref 3.5–5.1)
Protein, Total: 8.1 g/dL (ref 6.0–8.3)
Sodium: 138 mEq/L (ref 136–145)

## 2016-06-23 LAB — RAPID DRUG SCREEN, URINE
Barbiturate Screen, UR: NEGATIVE
Benzodiazepine Screen, UR: NEGATIVE
Cannabinoid Screen, UR: POSITIVE — AB
Cocaine, UR: NEGATIVE
Opiate Screen, UR: NEGATIVE
PCP Screen, UR: NEGATIVE
Urine Amphetamine Screen: NEGATIVE

## 2016-06-23 LAB — CBC
Absolute NRBC: 0 10*3/uL
Hematocrit: 44 % (ref 42.0–52.0)
Hgb: 14.9 g/dL (ref 13.0–17.0)
MCH: 30.8 pg (ref 28.0–32.0)
MCHC: 33.9 g/dL (ref 32.0–36.0)
MCV: 91.1 fL (ref 80.0–100.0)
MPV: 11.2 fL (ref 9.4–12.3)
Nucleated RBC: 0 /100 WBC (ref 0.0–1.0)
Platelets: 277 10*3/uL (ref 140–400)
RBC: 4.83 10*6/uL (ref 4.70–6.00)
RDW: 14 % (ref 12–15)
WBC: 26.35 10*3/uL — ABNORMAL HIGH (ref 3.50–10.80)

## 2016-06-23 LAB — HEMOLYSIS INDEX
Hemolysis Index: 40 — ABNORMAL HIGH (ref 0–18)
Hemolysis Index: 52 — ABNORMAL HIGH (ref 0–18)

## 2016-06-23 LAB — CK: Creatine Kinase (CK): 199 U/L (ref 47–267)

## 2016-06-23 LAB — ETHANOL: Alcohol: NOT DETECTED mg/dL

## 2016-06-23 LAB — PHOSPHORUS: Phosphorus: 2.4 mg/dL (ref 2.3–4.7)

## 2016-06-23 LAB — MAGNESIUM: Magnesium: 2.8 mg/dL — ABNORMAL HIGH (ref 1.6–2.6)

## 2016-06-23 LAB — GFR: EGFR: >60

## 2016-06-23 LAB — SALICYLATE LEVEL: Salicylate Level: <5 mg/dL — ABNORMAL LOW (ref 15.0–30.0)

## 2016-06-23 LAB — POCT RAPID STREP A: Rapid Strep A Screen POCT: POSITIVE — AB

## 2016-06-23 LAB — ACETAMINOPHEN LEVEL: Acetaminophen Level: <7 ug/mL — ABNORMAL LOW (ref 10–30)

## 2016-06-23 LAB — TSH: TSH: 0.3 u[IU]/mL — ABNORMAL LOW (ref 0.35–4.94)

## 2016-06-23 LAB — LIPASE: Lipase: 9 U/L (ref 8–78)

## 2016-06-23 MED ORDER — IOHEXOL 350 MG/ML IV SOLN
100.0000 mL | Freq: Once | INTRAVENOUS | Status: AC | PRN
Start: 2016-06-23 — End: 2016-06-23
  Administered 2016-06-23: 100 mL via INTRAVENOUS

## 2016-06-23 MED ORDER — AMOXICILLIN 500 MG PO CAPS
500.0000 mg | ORAL_CAPSULE | Freq: Two times a day (BID) | ORAL | 0 refills | Status: AC
Start: 2016-06-23 — End: 2016-07-03

## 2016-06-23 MED ORDER — SODIUM CHLORIDE 0.9 % IV BOLUS
1000.0000 mL | Freq: Once | INTRAVENOUS | Status: AC
Start: 2016-06-23 — End: 2016-06-23
  Administered 2016-06-23: 1000 mL via INTRAVENOUS

## 2016-06-23 MED ORDER — AMOXICILLIN 250 MG PO CAPS
500.0000 mg | ORAL_CAPSULE | Freq: Once | ORAL | Status: AC
Start: 2016-06-23 — End: 2016-06-23
  Administered 2016-06-23: 500 mg via ORAL
  Filled 2016-06-23: qty 2
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
## 2016-06-23 NOTE — ED Notes (Signed)
Bed: PU33  Expected date:   Expected time:   Means of arrival:   Comments:  CN Hold

## 2016-06-23 NOTE — ED Notes (Signed)
I did not assume care of this patient. Patient is reassigned RN.

## 2016-06-23 NOTE — Discharge Instructions (Signed)
Pharyngitis    You have been diagnosed with pharyngitis.    Pharyngitis is an infection of the back of your throat. Most sore throats are caused by viruses and do not require antibiotics. Some sore throats are caused by bacteria. Antibiotics will help this type of sore throat. A test for Strep throat may be used to help in your diagnosis.    Symptoms of pharyngitis include fever (temperature higher than 100.37F / 38C), sore throat, and a hoarse voice. If you have cold symptoms such as sneezing and coughing, runny nose, or congestion, your sore throat is more likely to be caused by a virus and not bacteria.    Whether your sore throat is caused by a virus or bacteria, you may need medication for pain and fever. You should also drink a lot of fluid. If your sore throat is caused by bacteria, you will also need antibiotics. If your sore throat is caused by a virus, you do not need antibiotics. Antibiotics will not kill the virus and they may cause side-effects, like diarrhea, abdominal cramps, or allergic reactions. Taking an antibiotic that you do not need may cause "resistance," meaning that antibiotic won't work in the future when you have a true bacterial infection.    YOU SHOULD SEEK MEDICAL ATTENTION IMMEDIATELY, EITHER HERE OR AT THE NEAREST EMERGENCY DEPARTMENT, IF ANY OF THE FOLLOWING OCCURS:   You have difficulty breathing.   Your voices changes or seems muffled.   You have trouble swallowing.   You have a fever (temperature higher than 100.37F / 38C) that won't go away.   You feel worse or do not improve after 2 to 3 days.                  RUQ Abdominal Pain, NOS    You have been seen for pain in the right upper part of the abdomen (belly). This is called the "right upper quadrant."    The pain may be biliary colic. Biliary colic means "cramping of the gallbladder." Biliary colic is pain from a gallbladder that has gallstones.    The gallbladder is a small sack that hangs from the liver. It  stores liquid called bile. The liver makes bile. When you eat, the gall bladder squeezes bile through a duct (tube) into the intestines. This helps to digest food.    Gallstones form from bile crystals. The stone may be smaller than a pea or as large as a golf ball. There may be one or several stones. The stone may block the tube that drains the bile from the gallbladder. This blockage causes gallbladder spasms and pain. The pain usually comes and goes and is crampy. It often starts after eating foods with a lot of fat.    You should eat fewer fatty foods.    Your pain and any other symptoms, such as nausea (sick to your stomach), can be treated with medicines.    You should drink only clear liquids like water, clear caffeine-free soft drinks or sports drinks for the next:   24 hours.    YOU SHOULD SEEK MEDICAL ATTENTION IMMEDIATELY, EITHER HERE OR AT THE NEAREST EMERGENCY DEPARTMENT, IF ANY OF THE FOLLOWING OCCURS:   Pain gets worse or does not go away.   You keep vomiting or can't keep any fluids down.   You have fever (temperature higher than 100.37F / 38C) or shaking chills.   Yellowing of your skin or eyes.   Dark, brown-colored urine.  Vomiting    You have been seen for vomiting.    Vomiting (throwing-up) can be caused by many different things. Most of the time the cause IS NOT serious. The doctor feels it is OK for you to go home today.    Common causes of vomiting include the following:   Gastroenteritis (stomach flu), usually with diarrhea.   Other illnesses. Sometimes medical conditions like diabetes, heart problems, headaches, or infections can make someone throw up.    Bowel obstructions (blockages) can cause vomiting and make patients unable to have bowel movements (stool) or pass gas.   Vomiting can be a symptom of appendicitis, especially if there is also pain in the right lower abdomen (belly).    Sometimes it is hard to find out what is causing the vomiting. Vomiting can be  treated with anti-nausea medicines like promethazine (Phenergan), prochlorperazine (Compazine) or ondansetron (Zofran).    Try to drink liquids to avoid dehydration. Don't drink a lot of fluid all at once. Take small sips throughout the day.    YOU SHOULD SEEK MEDICAL ATTENTION IMMEDIATELY, EITHER HERE OR AT THE NEAREST EMERGENCY DEPARTMENT, IF ANY OF THE FOLLOWING OCCURS:   You can't stop vomiting or your vomiting doesn't get better with medication.   You cannot keep liquids down.   You have severe sudden chest or belly pain after vomiting.   You have abdominal pain.            Marijuana Abuse (Edu)    What is it?    Marijuana is the most abused illegal (in most places) drug in the [place]. It is a psychoactive drug. It is also called maryjane, weed, ganja, refer, THC, and cannabis.     How is it used?    Marijuana is usually smoked but can be eaten as well.    What are the side-effects?    Some effects of using marijuana are:   Mood changes.   Sedation (make you sleepy). It can also cause stimulation (increase in activity).   Impaired judgment and/or memory.   Increased appetite and heart rate.    Decreased blood pressure.     Long-term use of marijuana can include:    Permanent memory problems.   Chronic (long-term) nausea (feeling sick).   Vomiting (throwing up), and abdominal (belly) pain.     In young people, long-term marijuana use can prevent normal brain development.     How does it work?    Marijuana works by acting on special receptors in the brain. Scientists do not fully understand the mechanism.     Why is it dangerous?    Marijuana is dangerous because it can cause long-term damage to the brain. This may include memory loss and trouble in solving problems. It can also stop normal development of the brain in young people. Long-term use can also cause cyclic (repeated) vomiting, nausea, and abdominal pain.    Marijuana has also been labeled a "gateway drug." This means that  marijuana use can lead to use of other drugs like cocaine, heroin, alcohol, and tobacco. These drugs can result in severe problems with the brain and heart. They can also result in death.     What to do to stop marijuana use.    Stopping all marijuana use is the best way to prevent long-term complications from the drug. Here are a few things you can do to quit marijuana. They will also help you improve your chances of success:  Your doctors and social workers can provide you with resources for outpatient care. You should consider a rehab facility or a treatment center to help with your addiction problem.    It is important that you have a support system to help keep you away from situations that might tempt you to use marijuana again. You may have cravings for several weeks. It is important to have dependable people that you can lean on to prevent you from using marijuana.   Cut ties with (stop hanging out with) people who encouraged your addiction to marijuana.    Insomnia is a common problem for marijuana withdrawal. You can visit a doctor to see if you need medication for this problem.    Seek care from your doctor or go to the nearest emergency department if you have thoughts about hurting yourself or hurting anyone else. The number for the suicide hotline is 1-800-273-TALK (8255).

## 2016-06-28 LAB — ECG 12-LEAD
Atrial Rate: 91 {beats}/min
P Axis: 64 degrees
P-R Interval: 132 ms
Q-T Interval: 354 ms
QRS Duration: 118 ms
QTC Calculation (Bezet): 435 ms
R Axis: 90 degrees
T Axis: 63 degrees
Ventricular Rate: 91 {beats}/min

## 2016-08-30 DIAGNOSIS — F1910 Other psychoactive substance abuse, uncomplicated: Secondary | ICD-10-CM

## 2016-08-30 DIAGNOSIS — S43005A Unspecified dislocation of left shoulder joint, initial encounter: Secondary | ICD-10-CM

## 2016-08-30 HISTORY — DX: Other psychoactive substance abuse, uncomplicated: F19.10

## 2016-08-30 HISTORY — DX: Unspecified dislocation of left shoulder joint, initial encounter: S43.005A

## 2017-03-28 ENCOUNTER — Emergency Department
Admission: EM | Admit: 2017-03-28 | Discharge: 2017-03-28 | Disposition: A | Discharge status: Home / Self Care | Payer: BLUE CROSS/BLUE SHIELD | Attending: Emergency Medicine | Admitting: Emergency Medicine

## 2017-03-28 DIAGNOSIS — K2900 Acute gastritis without bleeding: Secondary | ICD-10-CM | POA: Insufficient documentation

## 2017-03-28 DIAGNOSIS — F17210 Nicotine dependence, cigarettes, uncomplicated: Secondary | ICD-10-CM | POA: Insufficient documentation

## 2017-03-28 LAB — BASIC METABOLIC PANEL
BUN: 14 mg/dL (ref 9.0–28.0)
CO2: 21 mEq/L — ABNORMAL LOW (ref 22–29)
Calcium: 10 mg/dL (ref 8.5–10.5)
Chloride: 109 mEq/L (ref 100–111)
Creatinine: 1.1 mg/dL (ref 0.7–1.3)
Glucose: 112 mg/dL — ABNORMAL HIGH (ref 70–100)
Potassium: 4.2 mEq/L (ref 3.5–5.1)
Sodium: 142 mEq/L (ref 136–145)

## 2017-03-28 LAB — URINALYSIS, REFLEX TO MICROSCOPIC EXAM IF INDICATED
Bilirubin, UA: NEGATIVE
Blood, UA: NEGATIVE
Glucose, UA: NEGATIVE
Ketones UA: 5 — AB
Leukocyte Esterase, UA: NEGATIVE
Nitrite, UA: NEGATIVE
Protein, UR: 100 — AB
Specific Gravity UA: 1.03 (ref 1.001–1.035)
Urine pH: 5 (ref 5.0–8.0)
Urobilinogen, UA: 4 mg/dL

## 2017-03-28 LAB — CBC AND DIFFERENTIAL
Absolute NRBC: 0 10*3/uL
Basophils Absolute Automated: 0.06 10*3/uL (ref 0.00–0.20)
Basophils Automated: 0.4 %
Eosinophils Absolute Automated: 0.01 10*3/uL (ref 0.00–0.70)
Eosinophils Automated: 0.1 %
Hematocrit: 42.2 % (ref 42.0–52.0)
Hgb: 14.4 g/dL (ref 13.0–17.0)
Immature Granulocytes Absolute: 0.06 10*3/uL — ABNORMAL HIGH
Immature Granulocytes: 0.4 %
Lymphocytes Absolute Automated: 0.8 10*3/uL (ref 0.50–4.40)
Lymphocytes Automated: 5 %
MCH: 31.2 pg (ref 28.0–32.0)
MCHC: 34.1 g/dL (ref 32.0–36.0)
MCV: 91.5 fL (ref 80.0–100.0)
MPV: 12.1 fL (ref 9.4–12.3)
Monocytes Absolute Automated: 0.57 10*3/uL (ref 0.00–1.20)
Monocytes: 3.6 %
Neutrophils Absolute: 14.44 10*3/uL — ABNORMAL HIGH (ref 1.80–8.10)
Neutrophils: 90.5 %
Nucleated RBC: 0 /100 WBC (ref 0.0–1.0)
Platelets: 249 10*3/uL (ref 140–400)
RBC: 4.61 10*6/uL — ABNORMAL LOW (ref 4.70–6.00)
RDW: 15 % (ref 12–15)
WBC: 15.94 10*3/uL — ABNORMAL HIGH (ref 3.50–10.80)

## 2017-03-28 LAB — HEPATIC FUNCTION PANEL
ALT: 27 U/L (ref 0–55)
AST (SGOT): 37 U/L — ABNORMAL HIGH (ref 5–34)
Albumin/Globulin Ratio: 1.6 (ref 0.9–2.2)
Albumin: 4.7 g/dL (ref 3.5–5.0)
Alkaline Phosphatase: 58 U/L (ref 38–106)
Bilirubin Direct: 0.5 mg/dL (ref 0.0–0.5)
Bilirubin Indirect: 0.7 mg/dL (ref 0.0–1.1)
Bilirubin, Total: 1.2 mg/dL (ref 0.2–1.2)
Globulin: 2.9 g/dL (ref 2.0–3.6)
Protein, Total: 7.6 g/dL (ref 6.0–8.3)

## 2017-03-28 LAB — GFR: EGFR: >60

## 2017-03-28 LAB — LIPASE: Lipase: 20 U/L (ref 8–78)

## 2017-03-28 LAB — ETHANOL: Alcohol: NOT DETECTED mg/dL

## 2017-03-28 MED ORDER — RANITIDINE HCL 75 MG PO TABS
75.0000 mg | ORAL_TABLET | Freq: Two times a day (BID) | ORAL | 0 refills | Status: AC
Start: 2017-03-28 — End: 2017-04-02

## 2017-03-28 MED ORDER — PROMETHAZINE HCL 25 MG/ML IJ SOLN
12.5000 mg | Freq: Once | INTRAMUSCULAR | Status: AC
Start: 2017-03-28 — End: 2017-03-28
  Administered 2017-03-28: 12.5 mg via INTRAVENOUS
  Filled 2017-03-28: qty 1

## 2017-03-28 MED ORDER — PROMETHAZINE HCL 25 MG PO TABS
25.0000 mg | ORAL_TABLET | Freq: Four times a day (QID) | ORAL | 0 refills | Status: DC | PRN
Start: 2017-03-28 — End: 2017-06-04

## 2017-03-28 MED ORDER — SODIUM CHLORIDE 0.9 % IV BOLUS
1000.0000 mL | Freq: Once | INTRAVENOUS | Status: AC
Start: 2017-03-28 — End: 2017-03-28
  Administered 2017-03-28: 1000 mL via INTRAVENOUS

## 2017-03-28 MED ORDER — ONDANSETRON HCL 4 MG/2ML IJ SOLN
4.0000 mg | Freq: Once | INTRAMUSCULAR | Status: AC
Start: 2017-03-28 — End: 2017-03-28
  Administered 2017-03-28: 4 mg via INTRAVENOUS
  Filled 2017-03-28: qty 2

## 2017-03-28 MED ORDER — ALUM & MAG HYDROXIDE-SIMETH 200-200-20 MG/5ML PO SUSP
30.0000 mL | Freq: Once | ORAL | Status: AC
Start: 2017-03-28 — End: 2017-03-28
  Administered 2017-03-28: 30 mL via ORAL
  Filled 2017-03-28: qty 30

## 2017-03-28 MED ORDER — LIDOCAINE VISCOUS 2 % MT SOLN
10.0000 mL | Freq: Once | OROMUCOSAL | Status: AC
Start: 2017-03-28 — End: 2017-03-28
  Administered 2017-03-28: 10 mL via OROMUCOSAL
  Filled 2017-03-28: qty 15

## 2017-03-28 MED ORDER — FAMOTIDINE 10 MG/ML IV SOLN (WRAP)
20.0000 mg | Freq: Once | INTRAVENOUS | Status: AC
Start: 2017-03-28 — End: 2017-03-28
  Administered 2017-03-28: 20 mg via INTRAVENOUS
  Filled 2017-03-28: qty 2

## 2017-03-28 NOTE — ED Notes (Signed)
I have briefly evaluated this patient as triage physician in order to facilitate and initiate the ordering of laboratory and imaging studies as needed.       Latanya Presser, MD  03/28/17 1240

## 2017-03-28 NOTE — Discharge Instructions (Signed)
Dear Mr. Marinello:    Thank you for choosing the [facility] Emergency Department, the premier emergency department in the [place] area.  I hope your visit today was EXCELLENT.    Specific instructions for your visit today:    Your symptoms are likely due to stomach irritation from alcohol use.   You can use the antacid Zantac for help with stomach discomfort.  Use the phenergan as needed to help with nausea/vomiting.      For acute pain, you can also try over the counter remedies such as:  1. calcium carbonate tablets (ie Tums)  2. aluminum magnesium (ie Maalox)  3. bismuth (ie Pepto-bismol). Please note that bismuth containing medications may turn your stools dark colored.    Avoid the following food/drinks:  Fatty foods, caffeine, chocolate, spicy foods, carbonated beverages.    Please follow up with your primary care doctor in 2-3 days.  Return immediately to the ER for worsening symptoms, or new concerning symptoms.           If you do not continue to improve or your condition worsens, please contact your doctor or return immediately to the Emergency Department.    Sincerely,  Koleen Distance, MD  Attending Emergency Physician  [facility] Emergency Department    ONSITE PHARMACY  Our full service onsite pharmacy is located in the ER waiting room.  Open 7 days a week from 9 am to 11 pm.  We accept all major insurances and prices are competitive with major retailers.  Ask your provider to print your prescriptions down to the pharmacy to speed you on your way home.    OBTAINING A PRIMARY CARE APPOINTMENT    Primary care physicians (PCPs, also known as primary care doctors) are either internists or family medicine doctors. Both types of PCPs focus on health promotion, disease prevention, patient education and counseling, and treatment of acute and chronic medical conditions.    Call for an appointment with a primary care doctor.  Ask to see who is taking new patients.      [facility]  telephone:  [phone]  [email or web address]    DOCTOR REFERRALS  Call [phone] (available 24 hours a day, 7 days a week) if you need any further referrals and we can help you find a primary care doctor or specialist.  Also, available online at:  [email or web address]    YOUR CONTACT INFORMATION  Before leaving please check with registration to make sure we have an up-to-date contact number.  You can call registration at [phone] to update your information.  For questions about your hospital bill, please call [phone].  For questions about your Emergency Dept Physician bill please call [phone].      FREE HEALTH SERVICES  If you need help with health or social services, please call 2-1-1 for a free referral to resources in your area.  2-1-1 is a free service connecting people with information on health insurance, free clinics, pregnancy, mental health, dental care, food assistance, housing, and substance abuse counseling.  Also, available online at:  http://www.[facility].org    MEDICAL RECORDS AND TESTS  Certain laboratory test results do not come back the same day, for example urine cultures.   We will contact you if other important findings are noted.  Radiology films are often reviewed again to ensure accuracy.  If there is any discrepancy, we will notify you.      Please call [phone] to pick up a complimentary CD of any  radiology studies performed.  If you or your doctor would like to request a copy of your medical records, please call [phone].      ORTHOPEDIC INJURY   Please know that significant injuries can exist even when an initial x-ray is read as normal or negative.  This can occur because some fractures (broken bones) are not initially visible on x-rays.  For this reason, close outpatient follow-up with your primary care doctor or bone specialist (orthopedist) is required.    MEDICATIONS AND FOLLOWUP  Please be aware that some prescription  medications can cause drowsiness.  Use caution when driving or operating machinery.    The examination and treatment you have received in our Emergency Department is provided on an emergency basis, and is not intended to be a substitute for your primary care physician.  It is important that your doctor checks you again and that you report any new or remaining problems at that time.      24 HOUR PHARMACIES  The nearest 24 hour pharmacy is:    [facility]  [address]  [place], [place] [address]  [phone]      ASSISTANCE WITH INSURANCE    Affordable Care Act  [facility])  Call to start or finish an application, compare plans, enroll or ask a question.  [phone]  TTY: [phone]  Web:  Healthcare.gov    Help Enrolling in [facility]  [employer] [place]  [phone] (TOLL-FREE)  [phone] (TTY)  Web:  Http://www.coverva.org    Local Help Enrolling in the [facility]  [place] [facility]  [phone] (MAIN)  Email:  health-help@nvfs .org  Web:  BlackjackMyths.is  Address:  [address] [place], [place] [address]    SEDATING MEDICATIONS  Sedating medications include strong pain medications (e.g. narcotics), muscle relaxers, benzodiazepines (used for anxiety and as muscle relaxers), Benadryl/diphenhydramine and other antihistamines for allergic reactions/itching, and other medications.  If you are unsure if you have received a sedating medication, please ask your physician or nurse.  If you received a sedating medication: DO NOT drive a car. DO NOT operate machinery. DO NOT perform jobs where you need to be alert.  DO NOT drink alcoholic beverages while taking this medicine.     If you get dizzy, sit or lie down at the first signs. Be careful going up and down stairs.  Be extra careful to prevent falls.     Never give this medicine to others.     Keep this medicine out of reach of children.     Do not take or save old medicines. Throw them away when outdated.     Keep all  medicines in a cool, dry place. DO NOT keep them in your bathroom medicine cabinet or in a cabinet above the stove.    MEDICATION REFILLS  Please be aware that we cannot refill any prescriptions through the ER. If you need further treatment from what is provided at your ER visit, please follow up with your primary care doctor or your pain management specialist.    FREESTANDING EMERGENCY DEPARTMENTS OF [facility]  Did you know [facility] has two freestanding ERs located just a few miles away?  [facility] [place] and [facility] [place] have short wait times, easy free parking directly in front of the building and top patient satisfaction scores - and the same Board Certified Emergency Medicine doctors as [facility].
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
## 2017-06-03 ENCOUNTER — Inpatient Hospital Stay
Admission: EM | Admit: 2017-06-03 | Discharge: 2017-06-06 | DRG: 369 | Disposition: A | Discharge status: Home / Self Care | Payer: BLUE CROSS/BLUE SHIELD | Attending: Hospitalist | Admitting: Hospitalist

## 2017-06-03 ENCOUNTER — Emergency Department: Payer: BLUE CROSS/BLUE SHIELD

## 2017-06-03 DIAGNOSIS — Z87820 Personal history of traumatic brain injury: Secondary | ICD-10-CM

## 2017-06-03 DIAGNOSIS — F1910 Other psychoactive substance abuse, uncomplicated: Secondary | ICD-10-CM | POA: Diagnosis present

## 2017-06-03 DIAGNOSIS — F17210 Nicotine dependence, cigarettes, uncomplicated: Secondary | ICD-10-CM | POA: Diagnosis present

## 2017-06-03 DIAGNOSIS — F1220 Cannabis dependence, uncomplicated: Secondary | ICD-10-CM | POA: Diagnosis present

## 2017-06-03 DIAGNOSIS — R1115 Cyclical vomiting syndrome unrelated to migraine: Secondary | ICD-10-CM | POA: Diagnosis present

## 2017-06-03 DIAGNOSIS — M6282 Rhabdomyolysis: Secondary | ICD-10-CM | POA: Diagnosis present

## 2017-06-03 DIAGNOSIS — E440 Moderate protein-calorie malnutrition: Secondary | ICD-10-CM | POA: Diagnosis present

## 2017-06-03 DIAGNOSIS — R042 Hemoptysis: Secondary | ICD-10-CM

## 2017-06-03 DIAGNOSIS — G43A0 Cyclical vomiting, not intractable: Secondary | ICD-10-CM | POA: Diagnosis present

## 2017-06-03 DIAGNOSIS — R1011 Right upper quadrant pain: Secondary | ICD-10-CM

## 2017-06-03 DIAGNOSIS — K920 Hematemesis: Secondary | ICD-10-CM | POA: Diagnosis present

## 2017-06-03 DIAGNOSIS — R21 Rash and other nonspecific skin eruption: Secondary | ICD-10-CM | POA: Diagnosis present

## 2017-06-03 DIAGNOSIS — Z23 Encounter for immunization: Secondary | ICD-10-CM

## 2017-06-03 DIAGNOSIS — K226 Gastro-esophageal laceration-hemorrhage syndrome: Principal | ICD-10-CM | POA: Diagnosis present

## 2017-06-03 DIAGNOSIS — F1010 Alcohol abuse, uncomplicated: Secondary | ICD-10-CM | POA: Diagnosis present

## 2017-06-03 DIAGNOSIS — F1410 Cocaine abuse, uncomplicated: Secondary | ICD-10-CM | POA: Diagnosis present

## 2017-06-03 DIAGNOSIS — Z681 Body mass index (BMI) 19 or less, adult: Secondary | ICD-10-CM

## 2017-06-03 DIAGNOSIS — R945 Abnormal results of liver function studies: Secondary | ICD-10-CM | POA: Diagnosis present

## 2017-06-03 DIAGNOSIS — K759 Inflammatory liver disease, unspecified: Secondary | ICD-10-CM | POA: Diagnosis present

## 2017-06-03 DIAGNOSIS — M25512 Pain in left shoulder: Secondary | ICD-10-CM | POA: Diagnosis present

## 2017-06-03 HISTORY — DX: Acquired absence of other specified parts of digestive tract: Z90.49

## 2017-06-03 HISTORY — DX: Alcohol dependence, uncomplicated: F10.20

## 2017-06-03 LAB — CBC AND DIFFERENTIAL
Absolute NRBC: 0 10*3/uL
Basophils Absolute Automated: 0.03 10*3/uL (ref 0.00–0.20)
Basophils Automated: 0.3 %
Eosinophils Absolute Automated: 0.06 10*3/uL (ref 0.00–0.70)
Eosinophils Automated: 0.5 %
Hematocrit: 37.7 % — ABNORMAL LOW (ref 42.0–52.0)
Hgb: 13 g/dL (ref 13.0–17.0)
Immature Granulocytes Absolute: 0.03 10*3/uL
Immature Granulocytes: 0.3 %
Lymphocytes Absolute Automated: 1.21 10*3/uL (ref 0.50–4.40)
Lymphocytes Automated: 10.4 %
MCH: 31.5 pg (ref 28.0–32.0)
MCHC: 34.5 g/dL (ref 32.0–36.0)
MCV: 91.3 fL (ref 80.0–100.0)
MPV: 13 fL — ABNORMAL HIGH (ref 9.4–12.3)
Monocytes Absolute Automated: 1.14 10*3/uL (ref 0.00–1.20)
Monocytes: 9.8 %
Neutrophils Absolute: 9.21 10*3/uL — ABNORMAL HIGH (ref 1.80–8.10)
Neutrophils: 78.7 %
Nucleated RBC: 0 /100 WBC (ref 0.0–1.0)
Platelets: 146 10*3/uL (ref 140–400)
RBC: 4.13 10*6/uL — ABNORMAL LOW (ref 4.70–6.00)
RDW: 13 % (ref 12–15)
WBC: 11.68 10*3/uL — ABNORMAL HIGH (ref 3.50–10.80)

## 2017-06-03 LAB — COMPREHENSIVE METABOLIC PANEL
ALT: 78 U/L — ABNORMAL HIGH (ref 0–55)
AST (SGOT): 173 U/L — ABNORMAL HIGH (ref 5–34)
Albumin/Globulin Ratio: 1.1 (ref 0.9–2.2)
Albumin: 3.9 g/dL (ref 3.5–5.0)
Alkaline Phosphatase: 46 U/L (ref 38–106)
BUN: 9 mg/dL (ref 9.0–28.0)
Bilirubin, Total: 1.3 mg/dL — ABNORMAL HIGH (ref 0.2–1.2)
CO2: 22 mEq/L (ref 22–29)
Calcium: 9.5 mg/dL (ref 8.5–10.5)
Chloride: 101 mEq/L (ref 100–111)
Creatinine: 1.2 mg/dL (ref 0.7–1.3)
Globulin: 3.4 g/dL (ref 2.0–3.6)
Glucose: 90 mg/dL (ref 70–100)
Potassium: 4.4 mEq/L (ref 3.5–5.1)
Protein, Total: 7.3 g/dL (ref 6.0–8.3)
Sodium: 135 mEq/L — ABNORMAL LOW (ref 136–145)

## 2017-06-03 LAB — GFR: EGFR: >60

## 2017-06-03 NOTE — ED Notes (Signed)
I began evaluation of this patient at this time as the triage physician. 9:22 PM Filbert Schilder, MD    Pt with several issues and recent discharge from [facility].  Recurrent shoulder dislocattions, some hemoptysis, and reports kidney number (presumed creatinine) is 6.     Filbert Schilder, MD  06/03/17 2123

## 2017-06-04 ENCOUNTER — Inpatient Hospital Stay: Payer: BLUE CROSS/BLUE SHIELD

## 2017-06-04 ENCOUNTER — Emergency Department: Payer: BLUE CROSS/BLUE SHIELD

## 2017-06-04 DIAGNOSIS — R1115 Cyclical vomiting syndrome unrelated to migraine: Secondary | ICD-10-CM | POA: Diagnosis present

## 2017-06-04 DIAGNOSIS — Z87828 Personal history of other (healed) physical injury and trauma: Secondary | ICD-10-CM | POA: Insufficient documentation

## 2017-06-04 DIAGNOSIS — K922 Gastrointestinal hemorrhage, unspecified: Secondary | ICD-10-CM

## 2017-06-04 DIAGNOSIS — K920 Hematemesis: Secondary | ICD-10-CM | POA: Diagnosis present

## 2017-06-04 DIAGNOSIS — R042 Hemoptysis: Secondary | ICD-10-CM

## 2017-06-04 DIAGNOSIS — K759 Inflammatory liver disease, unspecified: Secondary | ICD-10-CM | POA: Diagnosis present

## 2017-06-04 DIAGNOSIS — F1910 Other psychoactive substance abuse, uncomplicated: Secondary | ICD-10-CM | POA: Diagnosis present

## 2017-06-04 DIAGNOSIS — M25512 Pain in left shoulder: Secondary | ICD-10-CM | POA: Diagnosis present

## 2017-06-04 DIAGNOSIS — R21 Rash and other nonspecific skin eruption: Secondary | ICD-10-CM

## 2017-06-04 LAB — TSH: TSH: 1.09 u[IU]/mL (ref 0.35–4.94)

## 2017-06-04 LAB — HEMOLYSIS INDEX
Hemolysis Index: 24 — ABNORMAL HIGH (ref 0–18)
Hemolysis Index: 15 (ref 0–18)

## 2017-06-04 LAB — URINALYSIS, REFLEX TO MICROSCOPIC EXAM IF INDICATED
Bilirubin, UA: NEGATIVE
Blood, UA: NEGATIVE
Glucose, UA: >=500 — AB
Ketones UA: NEGATIVE
Leukocyte Esterase, UA: NEGATIVE
Nitrite, UA: NEGATIVE
Protein, UR: NEGATIVE
Specific Gravity UA: 1.006 (ref 1.001–1.035)
Urine pH: 7 (ref 5.0–8.0)
Urobilinogen, UA: 2 mg/dL

## 2017-06-04 LAB — CBC
Absolute NRBC: 0 10*3/uL
Hematocrit: 37.3 % — ABNORMAL LOW (ref 42.0–52.0)
Hgb: 13 g/dL (ref 13.0–17.0)
MCH: 31.3 pg (ref 28.0–32.0)
MCHC: 34.9 g/dL (ref 32.0–36.0)
MCV: 89.7 fL (ref 80.0–100.0)
MPV: 11.7 fL (ref 9.4–12.3)
Nucleated RBC: 0 /100 WBC (ref 0.0–1.0)
Platelets: 216 10*3/uL (ref 140–400)
RBC: 4.16 10*6/uL — ABNORMAL LOW (ref 4.70–6.00)
RDW: 13 % (ref 12–15)
WBC: 13.29 10*3/uL — ABNORMAL HIGH (ref 3.50–10.80)

## 2017-06-04 LAB — COMPREHENSIVE METABOLIC PANEL
ALT: 65 U/L — ABNORMAL HIGH (ref 0–55)
AST (SGOT): 109 U/L — ABNORMAL HIGH (ref 5–34)
Albumin/Globulin Ratio: 1.4 (ref 0.9–2.2)
Albumin: 3.9 g/dL (ref 3.5–5.0)
Alkaline Phosphatase: 47 U/L (ref 38–106)
BUN: 10 mg/dL (ref 9.0–28.0)
Bilirubin, Total: 1.1 mg/dL (ref 0.2–1.2)
CO2: 22 mEq/L (ref 22–29)
Calcium: 9.2 mg/dL (ref 8.5–10.5)
Chloride: 100 mEq/L (ref 100–111)
Creatinine: 1 mg/dL (ref 0.7–1.3)
Globulin: 2.7 g/dL (ref 2.0–3.6)
Glucose: 92 mg/dL (ref 70–100)
Potassium: 3.4 mEq/L — ABNORMAL LOW (ref 3.5–5.1)
Protein, Total: 6.6 g/dL (ref 6.0–8.3)
Sodium: 135 mEq/L — ABNORMAL LOW (ref 136–145)

## 2017-06-04 LAB — RAPID DRUG SCREEN, URINE
Barbiturate Screen, UR: NEGATIVE
Benzodiazepine Screen, UR: NEGATIVE
Cannabinoid Screen, UR: POSITIVE — AB
Cocaine, UR: NEGATIVE
Opiate Screen, UR: NEGATIVE
PCP Screen, UR: NEGATIVE
Urine Amphetamine Screen: NEGATIVE

## 2017-06-04 LAB — HEPATITIS B SURFACE ANTIBODY: HEPATITIS B SURFACE ANTIBODY: 321.58

## 2017-06-04 LAB — T4, FREE: T4 Free: 1.05 ng/dL (ref 0.70–1.48)

## 2017-06-04 LAB — HEPATITIS B CORE ANTIBODY, TOTAL: Hepatitis B Core Total AB: NONREACTIVE

## 2017-06-04 LAB — HEPATITIS B SURFACE ANTIGEN W/ REFLEX TO CONFIRMATION: Hepatitis B Surface Antigen: NONREACTIVE

## 2017-06-04 LAB — TYPE AND SCREEN
AB Screen Gel: NEGATIVE
ABO Rh: B POS

## 2017-06-04 LAB — CK: Creatine Kinase (CK): 6778 U/L — ABNORMAL HIGH (ref 47–267)

## 2017-06-04 LAB — IHS D-DIMER: D-Dimer: 1.52 ug/mL FEU — ABNORMAL HIGH (ref 0.00–0.50)

## 2017-06-04 LAB — GFR: EGFR: >60

## 2017-06-04 LAB — HEPATITIS C ANTIBODY: Hepatitis C, AB: NONREACTIVE

## 2017-06-04 LAB — HEPATITIS A ANTIBODY, IGM: Hep A IgM: NONREACTIVE

## 2017-06-04 LAB — HIV AG/AB 4TH GENERATION: HIV Ag/Ab, 4th Generation: NONREACTIVE

## 2017-06-04 MED ORDER — PROCHLORPERAZINE EDISYLATE 5 MG/ML IJ SOLN
5.0000 mg | INTRAMUSCULAR | Status: DC | PRN
Start: 2017-06-04 — End: 2017-06-06
  Administered 2017-06-04 – 2017-06-05 (×5): 5 mg via INTRAVENOUS
  Filled 2017-06-04 (×2): qty 2

## 2017-06-04 MED ORDER — INFLUENZA VAC SPLIT QUAD 0.5 ML IM SUSY
0.5000 mL | PREFILLED_SYRINGE | Freq: Once | INTRAMUSCULAR | Status: AC
Start: 2017-06-04 — End: 2017-06-04
  Administered 2017-06-04: 20:00:00 0.5 mL via INTRAMUSCULAR
  Filled 2017-06-04: qty 0.5

## 2017-06-04 MED ORDER — HYDRALAZINE HCL 20 MG/ML IJ SOLN
10.0000 mg | Freq: Four times a day (QID) | INTRAMUSCULAR | Status: DC | PRN
Start: 2017-06-04 — End: 2017-06-06
  Administered 2017-06-04 – 2017-06-05 (×2): 10 mg via INTRAVENOUS
  Filled 2017-06-04 (×3): qty 1

## 2017-06-04 MED ORDER — ACETAMINOPHEN 325 MG PO TABS
650.0000 mg | ORAL_TABLET | Freq: Four times a day (QID) | ORAL | Status: DC | PRN
Start: 2017-06-04 — End: 2017-06-06
  Administered 2017-06-04: 650 mg via ORAL
  Filled 2017-06-04: qty 2

## 2017-06-04 MED ORDER — IOHEXOL 350 MG/ML IV SOLN
100.0000 mL | Freq: Once | INTRAVENOUS | Status: AC | PRN
Start: 2017-06-04 — End: 2017-06-04
  Administered 2017-06-04: 05:00:00 100 mL via INTRAVENOUS

## 2017-06-04 MED ORDER — OXYCODONE-ACETAMINOPHEN 5-325 MG PO TABS
1.0000 | ORAL_TABLET | ORAL | Status: DC | PRN
Start: 2017-06-04 — End: 2017-06-06
  Administered 2017-06-05: 1 via ORAL
  Filled 2017-06-04 (×2): qty 1

## 2017-06-04 MED ORDER — NALOXONE HCL 0.4 MG/ML IJ SOLN (WRAP)
0.2000 mg | INTRAMUSCULAR | Status: DC | PRN
Start: 2017-06-04 — End: 2017-06-06

## 2017-06-04 MED ORDER — MORPHINE SULFATE 4 MG/ML IJ/IV SOLN (WRAP)
4.0000 mg | Freq: Once | Status: AC
Start: 2017-06-04 — End: 2017-06-04
  Administered 2017-06-04: 02:00:00 4 mg via INTRAVENOUS
  Filled 2017-06-04: qty 1

## 2017-06-04 MED ORDER — SODIUM CHLORIDE 0.9 % IV SOLN
INTRAVENOUS | Status: DC
Start: 2017-06-04 — End: 2017-06-06

## 2017-06-04 MED ORDER — MORPHINE SULFATE 2 MG/ML IJ/IV SOLN (WRAP)
2.0000 mg | Status: DC | PRN
Start: 2017-06-04 — End: 2017-06-06
  Administered 2017-06-04 – 2017-06-06 (×7): 2 mg via INTRAVENOUS
  Filled 2017-06-04 (×8): qty 1

## 2017-06-04 MED ORDER — ONDANSETRON HCL 4 MG/2ML IJ SOLN
4.0000 mg | Freq: Three times a day (TID) | INTRAMUSCULAR | Status: DC | PRN
Start: 2017-06-04 — End: 2017-06-06
  Administered 2017-06-04 – 2017-06-05 (×4): 4 mg via INTRAVENOUS
  Filled 2017-06-04 (×4): qty 2

## 2017-06-04 MED ORDER — SERTRALINE HCL 50 MG PO TABS
50.0000 mg | ORAL_TABLET | Freq: Every day | ORAL | Status: DC
Start: 2017-06-04 — End: 2017-06-04

## 2017-06-04 MED ORDER — ONDANSETRON HCL 4 MG/2ML IJ SOLN
4.0000 mg | Freq: Once | INTRAMUSCULAR | Status: AC
Start: 2017-06-04 — End: 2017-06-04
  Administered 2017-06-04: 02:00:00 4 mg via INTRAVENOUS
  Filled 2017-06-04: qty 2

## 2017-06-04 MED ORDER — PENICILLIN G BENZATHINE 2400000 UNIT/4ML IM SUSP
2.4000 10*6.[IU] | Freq: Once | INTRAMUSCULAR | Status: AC
Start: 2017-06-04 — End: 2017-06-04
  Administered 2017-06-04: 02:00:00 2.4 10*6.[IU] via INTRAMUSCULAR
  Filled 2017-06-04 (×2): qty 4

## 2017-06-04 NOTE — Progress Notes (Signed)
MEDICINE PROGRESS NOTE    Date Time: 06/04/17 4:28 PM  Patient Name: Joel [name], Joel [name].  Attending Physician: Della Goo, MD P*    Assessment:   Principal Problem:    Hematemesis  Active Problems:    Nausea and vomiting    Rash    Left shoulder pain    Abnormal LFTs    History of traumatic head injury    Substance abuse  Resolved Problems:    * No resolved hospital problems. 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    Plan:     # Hematemesis / Nausea and Vomiting: He presented with about 5 days of intractable nausea, vomiting, and RUQ abdominal pain with small volume hematemesis on the day of admission.  He had stable Hgb 13.0 on admission, near baseline.  He had a positive D-dimer, with concern for PE and hemoptysis rather hematemesis.  CTA chest was negative for PE or other acute process in the chest.  CT abdomen/pelvis showed a distended gallbladder without wall thickening and distended stomach with oral contrast, but no acute process to explain his symptoms.  He denied further nausea and vomiting when seen in the early afternoon.  His symptoms are likely related to his use of alcohol and marijuana.  Acute gastroenteritis is also a possibility.  -- Appreciate GI recs  -- Trend CBC and monitor for bleeding  -- No current plan for endoscopy  -- Clear liquid diet, advance if tolerated  -- Continue Pantoprazole 40 mg IV daily  -- Antiemetics as needed  -- Avoid all NSAIDs    # Abnormal LFTs: He had abnormal LFTs with AST 173, ALT 78, ALK 46, Alb 3.9, and TBili 1.3 on admission.  He had negative HAV, HBV, HCV, and HIV serologies on admission.  His liver appered normal on CT abdomen/pelvis.  His LFT abnormalities could be from  mild alcoholic hepatitis.  -- Appreciate GI recs  -- Trend LFTs and add on CK    # Hyperpigmented Rash on Palms and Soles: He was noted to have a hyperpigmented and somewhat scaly rash on his palms and soles.  There was concern for secondary syphilis, and he was given Penicillin G 2.4 million units IV once in the ED.  RPR is pending.  -- Consider ID consult  -- Follow up pending RPR    # Left Shoulder Pain / Recent Left Shoulder Dislocation: He was recently seen at OSH for dislocation of his left shoulder, and reports continued pain.  XR left shoulder showed a possible bony fracture fragment on the AP views.  CT left shoulder was completed with Radiology read pending.  -- Consider Orthopedics consult  -- Follow up pending CT left shoulder report  -- Continue Percocet 5-325 mg PO Q4H PRN pain    # TBI History / Bipolar Disorder History: He reports that he was previously on Sertraline and Quetiapine, but is no longer taking any medication.  -- Outpatient Psychiatry followup    # Substance Abuse: He reports a history of cocaine, marijuana, and alcohol abuse.  His UTox on admission was positive for cannabinoids.  He does not currently appear to be in withdrawal.  -- Request CATS consult  -- Monitor for withdrawal  symptoms      Case discussed with: Patient, Mother, Nursing, GI    Safety Checklist:     DVT prophylaxis:  CHEST guideline (See page [id number]) Mechanical, no AC with possible hematemesis   Foley:  [place] Rn Foley protocol Not present   IVs:  Peripheral IV   PT/OT: Not needed   Daily CBC & or Chem ordered:  SHM/ABIM guidelines (see #5) Yes, due to clinical and lab instability   Reference for approximate charges of common labs: CBC auto diff - $76  BMP - $99  Mg - $79    Lines:     Patient Lines/Drains/Airways Status    Active PICC Line / CVC Line / PIV Line / Drain / Airway / Intraosseous Line / Epidural Line / ART Line / Line / Wound / Pressure Ulcer / NG/OG Tube     Name:   Placement date:   Placement time:    Site:   Days:    Peripheral IV 06/04/17 Left Antecubital  06/04/17    0258    Antecubital    less than 1                 Disposition: (Please see PAF column for Expected D/C Date)   Today's date: 06/04/2017  Admit Date: 06/03/2017 11:15 PM  LOS: 0  Clinical Milestones: stable GI symptoms, syphilis workup, CT left shoulder  Anticipated discharge needs: home      Subjective     CC: Hematemesis    Interval History/24 hour events: No acute events since admission.    HPI/Subjective: He reports feeling much better since admission.  He denies any new nausea or vomiting since this AM.  He has not had any further hematemesis.  He denies any current abdominal pain.  He reports poor appetite at baseline and difficulty maintaining his weight.  He denies any pain or itching from the hyperpigmented rash on his palms and soles.  He denies any rash elsewhere.  He reports pain in his left shoulder, with recent dislocation.      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  He denies any current fever, chills, headache, numbness, tingling, focal weakness, chest pain, palpitations, shortness of breath, cough, diarrhea, constipation, melena, BRBPR, dysuria, frequency, or other abnormal bleeding.      Physical Exam:     VITAL SIGNS PHYSICAL EXAM   Temp:  [97.5 F (36.4 C)-98 F (36.7 C)] 97.5 F (36.4 C)  Heart Rate:  [61-97] 61  Resp Rate:  [16-18] 16  BP: (116-177)/(82-111) 177/111          No intake or output data in the 24 hours ending 06/04/17 1628 Physical Exam  General: awake, alert, NAD  HEENT: anicteric sclerae, PERRL; oropharynx clear, MMM  Cardiovascular: regular rate and rhythm; normal S1 and S2; no murmurs, rubs, or gallops  Lungs: clear to auscultation bilaterally, without wheezing, rhonchi, or rales  Abdomen: normoactive bowel sounds; soft, non-distended, no palpable masses; non-tender to palpation, no rebound or guarding  Extremities: warm, distal pulses 2+ bilaterally;  no LE edema  Neuro: symmetric facial movements, clear speech, moving all extremities  Skin: hyperpigmented and slightly scaly patches on bilateral soles and lesser extent palms, not present elsewhere           Meds:     Medications were reviewed:  Current Facility-Administered Medications   Medication Dose Route Frequency     Current Facility-Administered Medications  Medication Dose Route Frequency Last Rate     Current Facility-Administered Medications   Medication Dose Route   . naloxone  0.2 mg Intravenous   . ondansetron  4 mg Intravenous   . oxyCODONE-acetaminophen  1 tablet Oral   . prochlorperazine  5 mg Intravenous         Labs:     Labs (last 72 hours):      Recent Labs  Lab 06/03/17  2240   WBC 11.68*   Hgb 13.0   Hematocrit 37.7*   Platelets 146            Recent Labs  Lab 06/03/17  2240   Sodium 135*   Potassium 4.4   Chloride 101   CO2 22   BUN 9.0   Creatinine 1.2   Calcium 9.5   Albumin 3.9   Protein, Total 7.3   Bilirubin, Total 1.3*   Alkaline Phosphatase 46   ALT 78*   AST (SGOT) 173*   Glucose 90                   Microbiology, reviewed and are significant for:  Microbiology Results     None          Imaging, reviewed and are significant for:  # CXR 2 Views (06/03/17): No pneumothorax. No focal airspace opacity. Pulmonary vascularity is normal. No pleural effusion. Cardiac silhouette and mediastinal contours are within normal. Osseous structures appear intact.  IMPRESSION: No acute disease.    # XR Left Shoulder (06/03/17): On the AP internal and extra sternal rotation views there is a linear density adjacent to the medial aspect of the proximal humerus, inferior to the glenoid region. This may represent a fracture fragment although the donor site is not clearly identified. This density is not localized on the Y views. No dislocation. Acromioclavicular joint is within normal. IMPRESSION: Linear density adjacent to the medial aspect of the left proximal humerus, inferior to the glenoid seen only on  the 2 AP views. This may represent a bony fracture fragment although the donor site is not identified. Consider CT for further evaluation.    # CTA Chest and CT Abdomen/Pelvis (06/04/17): CHEST: Heart size is normal. There is no pericardial effusion. Aorta is normal in caliber. Is no focal aneurysmal dilatation or dissection. There are no enlarged mediastinal or hilar lymph nodes. There is no focal consolidation, pleural effusion, atelectasis or pneumothorax. Central airways are patent. No pulmonary emboli are identified. Skeletal structures appear normal.  ABDOMEN: No abnormality is identified in the liver. Gallbladder is distended without thickened wall. Fundus of the gallbladder is just above the umbilicus in the anterior abdomen. No inflammatory change of the pancreas is seen. The spleen and adrenal glands appear normal. There is no hydronephrosis or pyelonephritis in either the right or left kidney. Vascular structures appear normal. There are no enlarged retroperitoneal lymph nodes. Stomach is distended with oral contrast. Small bowel is normal in caliber. Oral contrast has reached the colon. Patient is status post appendectomy. No inflammatory change of the colon is seen. There is limited evaluation of bowel mesentery due to decreased intra-abdominal adipose tissue. Bladder is distended. There is no free fluid or free air. No skeletal abnormalities identified.  IMPRESSION: 1. No pulmonary emboli identified. 2. No acute intra-abdominal or pelvic abnormality to account for patient's abdominal symptoms identified.        Signed by: Della Goo, MD PhD
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
## 2017-06-04 NOTE — Progress Notes (Addendum)
At 21:19 resident cross cover paged regarding pt's BP 176/107 and temp of 100.2. No response. Cross cover called at [facility] at [phone] and spoke to Dr. Mervin Kung  At 22:10 and at that time informed that the hospitalist is covering for Dr. Carolin Coy and page them. Hospoitalist paged with pt's vitals at this time. Awaiting response.    Hospitals give hydralazine IV orders with paramterts. See orders.

## 2017-06-04 NOTE — Progress Notes (Signed)
Pt OOB- taking shower.  Bedside RN states that patient is doing well. RN to call for further problems.
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
## 2017-06-04 NOTE — Plan of Care (Addendum)
Medicine Progress Note:    Neuro: Pt is A&O times 4.     Pulm: Pt has clear breath sounds. Oxygen saturation is in the 90s on room air.     Card: B/p at 1100 was 163/92. However this was after pt had recently had episodes of vomiting in ER. B/p at 1500 was 177/111. Pt was vomiting at that time too. Pt was given zofran. B/p at 1700 was 176/109. Pt was still having episodes of vomiting. PRN compazine was given. Nursing will continue to monitor b/p.     GI/GU: Pt was having episodes of vomiting at around 1500. PRN zofran was given. After zofran was given pt started to drink fluids. Pt was vomiting again at 1700. Doctor was called and made aware and compazine was ordered and administered.     Skin: Pt has blisters to palms of hands and soles of feet.     Safety: Pt is a low fall risk. Nursing staff performed hourly rounding.    Musculoskeletal: Er nurse told nurse that pt's shoulder has dislocated multiple times. However pt was always able to put shoulder back into joint. X-ray was taken of left shoulder and CT scan was also done. Dr. Carolin Coy said orthopedic doctors were awaiting results of CT-Scan. Pt was given a large sling to put his LUE in. Pt dislocated shoulder twice upon arrival to floor. The second time this occurred pt started to cry and came out of room. RRT was called but shortly afterward pt was able to put shoulder back into joint. Later on CT-Scan showed    Pain: Pt c/o pain 7/10 to left shoulder. Dr. Carolin Coy was notified and made aware and prn morphine was ordered and administered with good effect.     Psychosocial: Pt is very anxious and restless and will call staff repeatedly. Supportive care was given.     Active Lines: Pt has a LAC PIV. PIV is attached to continuous IVF.     Vitals:    06/04/17 1057 06/04/17 1100 06/04/17 1500 06/04/17 1700   BP:  (!) 163/92 (!) 177/111 (!) 176/109   Pulse: 86 65 61 89   Resp:  16 16 18    Temp:  97.5 F (36.4 C) 97.5 F (36.4 C)    TempSrc:  Oral Oral    SpO2: 100% 100%  99% 100%   Weight:       Height:           No intake or output data in the 24 hours ending 06/04/17 2049    Problem: Safety  Goal: Patient will be free from injury during hospitalization  Outcome: Progressing   06/04/17 2048   Goal/Interventions addressed this shift   Patient will be free from injury during hospitalization  Assess patient's risk for falls and implement fall prevention plan of care per policy;Provide and maintain safe environment;Use appropriate transfer methods;Ensure appropriate safety devices are available at the bedside;Include patient/ family/ care giver in decisions related to safety;Hourly rounding     Goal: Patient will be free from infection during hospitalization  Outcome: Progressing   06/04/17 2048   Goal/Interventions addressed this shift   Free from Infection during hospitalization Assess and monitor for signs and symptoms of infection;Monitor lab/diagnostic results;Monitor all insertion sites (i.e. indwelling lines, tubes, urinary catheters, and drains);Encourage patient and family to use good hand hygiene technique       Problem: Pain  Goal: Pain at adequate level as identified by patient  Outcome: Progressing   06/04/17  2048   Goal/Interventions addressed this shift   Pain at adequate level as identified by patient Identify patient comfort function goal;Assess for risk of opioid induced respiratory depression, including snoring/sleep apnea. Alert healthcare team of risk factors identified.;Assess pain on admission, during daily assessment and/or before any "as needed" intervention(s);Reassess pain within 30-60 minutes of any procedure/intervention, per Pain Assessment, Intervention, Reassessment (AIR) Cycle;Evaluate if patient comfort function goal is met;Evaluate patient's satisfaction with pain management progress;Include patient/patient care companion in decisions related to pain management as needed       Problem: Side Effects from Pain Analgesia  Goal: Patient will experience  minimal side effects of analgesic therapy  Outcome: Progressing   06/04/17 2048   Goal/Interventions addressed this shift   Patient will experience minimal side effects of analgesic therapy Monitor/assess patient's respiratory status (RR depth, effort, breath sounds);Assess for changes in cognitive function;Prevent/manage side effects per LIP orders (i.e. nausea, vomiting, pruritus, constipation, urinary retention, etc.);Evaluate for opioid-induced sedation with appropriate assessment tool (i.e. POSS)       Problem: Discharge Barriers  Goal: Patient will be discharged home or other facility with appropriate resources  Outcome: Progressing   06/04/17 2048   Goal/Interventions addressed this shift   Discharge to home or other facility with appropriate resources Provide appropriate patient education;Initiate discharge planning;Provide information on available health resources       Problem: Psychosocial and Spiritual Needs  Goal: Demonstrates ability to cope with hospitalization/illness  Outcome: Progressing   06/04/17 2048   Goal/Interventions addressed this shift   Demonstrates ability to cope with hospitalizations/illness Encourage patient to set small goals for self;Assist patient to identify own strengths and abilities;Encourage verbalization of feelings/concerns/expectations;Provide quiet environment;Reinforce positive adaptation of new coping behaviors;Encourage participation in diversional activity;Include patient/ patient care companion in decisions

## 2017-06-05 DIAGNOSIS — Z87828 Personal history of other (healed) physical injury and trauma: Secondary | ICD-10-CM

## 2017-06-05 DIAGNOSIS — F1910 Other psychoactive substance abuse, uncomplicated: Secondary | ICD-10-CM

## 2017-06-05 DIAGNOSIS — M25512 Pain in left shoulder: Secondary | ICD-10-CM

## 2017-06-05 DIAGNOSIS — R945 Abnormal results of liver function studies: Secondary | ICD-10-CM

## 2017-06-05 DIAGNOSIS — K920 Hematemesis: Secondary | ICD-10-CM

## 2017-06-05 LAB — BASIC METABOLIC PANEL
BUN: 10 mg/dL (ref 9.0–28.0)
CO2: 23 mEq/L (ref 21–29)
Calcium: 9 mg/dL (ref 8.5–10.5)
Chloride: 103 mEq/L (ref 100–111)
Creatinine: 1 mg/dL (ref 0.5–1.5)
Glucose: 81 mg/dL (ref 70–100)
Potassium: 3.7 mEq/L (ref 3.5–5.1)
Sodium: 136 mEq/L (ref 136–145)

## 2017-06-05 LAB — GFR: EGFR: >60

## 2017-06-05 LAB — CBC
Absolute NRBC: 0 10*3/uL
Hematocrit: 39.1 % — ABNORMAL LOW (ref 42.0–52.0)
Hgb: 13.6 g/dL (ref 13.0–17.0)
MCH: 31.7 pg (ref 28.0–32.0)
MCHC: 34.8 g/dL (ref 32.0–36.0)
MCV: 91.1 fL (ref 80.0–100.0)
MPV: 12.3 fL (ref 9.4–12.3)
Nucleated RBC: 0 /100 WBC (ref 0.0–1.0)
Platelets: 227 10*3/uL (ref 140–400)
RBC: 4.29 10*6/uL — ABNORMAL LOW (ref 4.70–6.00)
RDW: 13 % (ref 12–15)
WBC: 12.04 10*3/uL — ABNORMAL HIGH (ref 3.50–10.80)

## 2017-06-05 LAB — RPR (REFLEX TO TITER AND CONFIRMATION): RPR: NONREACTIVE

## 2017-06-05 LAB — HEPATIC FUNCTION PANEL
ALT: 57 U/L — ABNORMAL HIGH (ref 0–55)
AST (SGOT): 78 U/L — ABNORMAL HIGH (ref 5–34)
Albumin/Globulin Ratio: 1.3 (ref 0.9–2.2)
Albumin: 3.6 g/dL (ref 3.5–5.0)
Alkaline Phosphatase: 52 U/L (ref 38–106)
Bilirubin Direct: 0.4 mg/dL (ref 0.0–0.5)
Bilirubin Indirect: 0.5 mg/dL (ref 0.0–1.0)
Bilirubin, Total: 0.9 mg/dL (ref 0.1–1.2)
Globulin: 2.7 g/dL (ref 2.0–3.7)
Protein, Total: 6.3 g/dL (ref 6.0–8.3)

## 2017-06-05 LAB — MAGNESIUM: Magnesium: 2.3 mg/dL (ref 1.6–2.6)

## 2017-06-05 LAB — HEMOLYSIS INDEX: Hemolysis Index: 27 — ABNORMAL HIGH (ref 0–18)

## 2017-06-05 LAB — CK: Creatine Kinase (CK): 2302 U/L — ABNORMAL HIGH (ref 47–267)

## 2017-06-05 MED ORDER — POTASSIUM CHLORIDE CRYS ER 20 MEQ PO TBCR
40.0000 meq | EXTENDED_RELEASE_TABLET | Freq: Once | ORAL | Status: AC
Start: 2017-06-05 — End: 2017-06-05
  Administered 2017-06-05: 11:00:00 40 meq via ORAL
  Filled 2017-06-05: qty 2

## 2017-06-05 MED ORDER — LORAZEPAM 1 MG PO TABS
1.0000 mg | ORAL_TABLET | Freq: Once | ORAL | Status: DC
Start: 2017-06-05 — End: 2017-06-06

## 2017-06-05 NOTE — Plan of Care (Addendum)
Medicine Progress Note:    Neuro: Pt is A&O times 4.     Pulm: Pt has clear breath sounds. Oxygen saturation is in the 90s on room air.    Card: B/p at 0829 was 169/107. Pt was given prn hydralazine and b/p at 1004 was 106/69. B/p at 1201 was 160/89. Dr. Carolin Coy was called and nurse expressed concern regarding pt's elevated blood pressure. Dr. Carolin Coy said he is aware that pt has experienced high blood pressure this admission but is difficult to tell if pt is truly hypertensive or whether blood pressure is elevated due to frequent episodes of agitation, restlessness and vomiting. B/p at 1526 was 125/82.     GI/GU: In the am pt was very nauseous and had an episode of emesis where he vomited a small amount of frothy white sputum with food particles. Pt was given prn compazine and zofran. In the afternoon, pt was drinking large amounts of fluids and asked to be transitioned to a regular diet. Pt was able to eat a fruit cup. For dinner pt chicken and rice. Afterwards pt vomited some of his dinner.     Skin: Pt has taken multiple hot showers today. Skin has remained CDI. Pt does have blisters to palms of hands and soles of feet and doctors are aware of these skin lesions.     Safety: Pt is a moderate fall risk. Pt declines bed alarm and ambulates in hallway frequently with a steady gait. Nursing staff performs hourly rounding.     Psychosocial: Pt's mood will often alternate between periods of mania and depression. Pt can be very anxious and calls nursing often. Pt says he has not visited psychiatrist in several weeks because he keeps on forgetting to make his apppointments. His mother also says he does not like his personal psychiatrist. Dr. Carolin Coy was made aware and psychiatry consult was made.    Pain: Pt was given morphine for c/o pain to LUE in am with good effect.    Musculoskeletal: Night nurse said pt can often be seen actively using his LUE even though he has dislocated it multiple times. Pt was instructed by nursing  to keep arm in sling and to avoid using it. Pt verbalized understanding but instructions will need to be reinforced. At end of shift, shoulder dislocated again but pt was able to pop it back in place.     Active Lines: A new PIV to RUA was placed. PIV flushes well and is connected to continous IVF.     Vitals:    06/05/17 0829 06/05/17 1004 06/05/17 1201 06/05/17 1526   BP: (!) 169/107 106/69 160/89 125/82   Pulse: 77 (!) 126  86   Resp: 20 20  20    Temp: 98.2 F (36.8 C) 97.9 F (36.6 C)  98.2 F (36.8 C)   TempSrc: Oral   Oral   SpO2: 100% 99%  98%   Weight:       Height:           No intake or output data in the 24 hours ending 06/05/17 1659    Problem: Safety  Goal: Patient will be free from injury during hospitalization  Outcome: Progressing   06/04/17 2048   Goal/Interventions addressed this shift   Patient will be free from injury during hospitalization  Assess patient's risk for falls and implement fall prevention plan of care per policy;Provide and maintain safe environment;Use appropriate transfer methods;Ensure appropriate safety devices are available at the bedside;Include patient/ family/  care giver in decisions related to safety;Hourly rounding     Goal: Patient will be free from infection during hospitalization  Outcome: Progressing   06/04/17 2048   Goal/Interventions addressed this shift   Free from Infection during hospitalization Assess and monitor for signs and symptoms of infection;Monitor lab/diagnostic results;Monitor all insertion sites (i.e. indwelling lines, tubes, urinary catheters, and drains);Encourage patient and family to use good hand hygiene technique       Problem: Pain  Goal: Pain at adequate level as identified by patient  Outcome: Progressing   06/04/17 2048   Goal/Interventions addressed this shift   Pain at adequate level as identified by patient Identify patient comfort function goal;Assess for risk of opioid induced respiratory depression, including snoring/sleep apnea.  Alert healthcare team of risk factors identified.;Assess pain on admission, during daily assessment and/or before any "as needed" intervention(s);Reassess pain within 30-60 minutes of any procedure/intervention, per Pain Assessment, Intervention, Reassessment (AIR) Cycle;Evaluate if patient comfort function goal is met;Evaluate patient's satisfaction with pain management progress;Include patient/patient care companion in decisions related to pain management as needed       Problem: Side Effects from Pain Analgesia  Goal: Patient will experience minimal side effects of analgesic therapy  Outcome: Progressing   06/04/17 2048   Goal/Interventions addressed this shift   Patient will experience minimal side effects of analgesic therapy Monitor/assess patient's respiratory status (RR depth, effort, breath sounds);Assess for changes in cognitive function;Prevent/manage side effects per LIP orders (i.e. nausea, vomiting, pruritus, constipation, urinary retention, etc.);Evaluate for opioid-induced sedation with appropriate assessment tool (i.e. POSS)       Problem: Discharge Barriers  Goal: Patient will be discharged home or other facility with appropriate resources  Outcome: Progressing   06/04/17 2048   Goal/Interventions addressed this shift   Discharge to home or other facility with appropriate resources Provide appropriate patient education;Initiate discharge planning;Provide information on available health resources       Problem: Psychosocial and Spiritual Needs  Goal: Demonstrates ability to cope with hospitalization/illness  Outcome: Progressing   06/04/17 2048   Goal/Interventions addressed this shift   Demonstrates ability to cope with hospitalizations/illness Encourage patient to set small goals for self;Assist patient to identify own strengths and abilities;Encourage verbalization of feelings/concerns/expectations;Provide quiet environment;Reinforce positive adaptation of new coping behaviors;Encourage  participation in diversional activity;Include patient/ patient care companion in decisions       Problem: Moderate/High Fall Risk Score >5  Goal: Patient will remain free of falls  Outcome: Progressing   06/05/17 1213   OTHER   Moderate Risk (6-13) Patient Refusal to have Falls Interventions
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
## 2017-06-05 NOTE — Progress Notes (Signed)
MEDICINE PROGRESS NOTE    Date Time: 06/05/17 11:17 AM  Patient Name: Joel [name], [name].  Attending Physician: Della Goo, MD P*    Assessment:   Principal Problem:    Hematemesis  Active Problems:    Nausea and vomiting    Rash    Left shoulder pain    Abnormal LFTs    History of traumatic head injury    Substance abuse  Resolved Problems:    * No resolved hospital problems. 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    Plan:     # Hematemesis / Nausea and Vomiting: He presented with about 5 days of intractable nausea, vomiting, and RUQ abdominal pain with small volume hematemesis on the day of admission.  He had stable Hgb 13.0 on admission, near baseline.  He had a positive D-dimer, with concern for PE and hemoptysis rather hematemesis.  CTA chest was negative for PE or other acute process in the chest.  CT abdomen/pelvis showed a distended gallbladder without wall thickening and distended stomach with oral contrast, but no acute process to explain his symptoms.  He was seen by GI, with no endoscopy planned.  His symptoms are likely related to his use of alcohol and marijuana, with acute gastroenteritis is also a possibility.  He had some further nausea and vomiting overnight and has not been tolerating much PO intake.  He reports feeling nauseous when thinking about eating.  He has not had any further hematemesis.  He had stable Hgb 13.0 last night and Hgb 13.6 this AM.  -- Appreciate GI recs  -- Trend CBC and monitor for bleeding  -- No current plan for endoscopy  -- Clear liquid diet, advance if tolerated  -- Continue Pantoprazole 40 mg IV daily  -- Antiemetics as needed  -- Avoid all NSAIDs    # Mild Rhabdomyolysis: He  had elevated CK 6778 yesterday AM.  He denies any recent trauma or concern for seizures.  He had improved CK 2302 this AM.  He has normal Cr 1.0 this AM.  -- Trend CK daily  -- Continue NS at 200 ml/hr    # Abnormal LFTs: He had abnormal LFTs with AST 173, ALT 78, ALK 46, Alb 3.9, and TBili 1.3 on admission.  He had negative HAV, HBV, HCV, and HIV serologies on admission.  His liver appered normal on CT abdomen/pelvis.  His LFT abnormalities are improving with AST 78, ALT 57, ALK 52, and TBili 0.9 today.  -- Appreciate GI recs  -- Trend LFTs periodically    # Hyperpigmented Rash on Palms and Soles: He was noted to have a hyperpigmented and somewhat scaly rash on his palms and soles.  There was concern for secondary syphilis, and he was given Penicillin G 2.4 million units IV once in the ED.  RPR is pending.  -- Consider ID consult  -- Follow up pending RPR    # Left Shoulder Pain / Recent Left Shoulder Dislocation: He was recently seen at OSH for dislocation of his left shoulder, and reports continued pain.  XR left shoulder showed a possible bony fracture fragment on the AP views.  CT left shoulder showed a Hill-Sachs deformity and small Bankart fracture.  He had another episode of dislocation with severe pain yesterday evening, but was able to relocate his shoulder.  He was evaluated by Orthopedics with recommendation for a sling and close outpatient followup in clinic.  -- Appreciate Orthopedics recs  -- Keep LUE in sling and avoid external rotation  -- Pain control as needed  -- PT/OT evaluation    # TBI History / Bipolar Disorder History: He reports that he was previously on Sertraline and Quetiapine, but is no longer taking any medication.  He would be interested in establishing care with Psychiatry.  -- Psychiatry consult order placed (need to call tomorrow)    # Substance Abuse: He reports a history of cocaine, marijuana, and alcohol abuse.  His UTox on admission was positive for cannabinoids.  He has been  anxious, does not currently appear to be in withdrawal.  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  He has had trouble increasing and his weight.  -- Nutrition consult requested      Case discussed with: Patient, Nursing    Safety Checklist:     DVT prophylaxis:  CHEST guideline (See page [id number]) Mechanical, no AC with possible hematemesis   Foley:  Trainer Rn Foley protocol Not present   IVs:  Peripheral IV   PT/OT: Not needed   Daily CBC & or Chem ordered:  SHM/ABIM guidelines (see #5) Yes, due to clinical and lab instability   Reference for approximate charges of common labs: CBC auto diff - $76  BMP - $99  Mg - $79    Lines:     Patient Lines/Drains/Airways Status    Active PICC Line / CVC Line / PIV Line / Drain / Airway / Intraosseous Line / Epidural Line / ART Line / Line / Wound / Pressure Ulcer / NG/OG Tube     Name:   Placement date:   Placement time:   Site:   Days:    Peripheral IV 06/04/17 Left Antecubital  06/04/17    0258    Antecubital    less than 1                 Disposition: (Please see PAF column for Expected D/C Date)   Today's date: 06/05/2017  Admit Date: 06/03/2017 11:15 PM  LOS: 1  Clinical Milestones: stable GI symptoms, syphilis workup, CT left shoulder  Anticipated discharge needs: home      Subjective     CC: Hematemesis    Interval History/24 hour events: He had a recurrent dislocation of his left shoulder last night with severe pain, improving once he was able to reduced the dislocation.    HPI/Subjective: He had several episodes of nausea or vomiting overnight.  He has had a poor appetite and not been tolerating much PO intake this AM.  He has an occasional headache.  He has not had any further hematemesis.  He denies any current abdominal pain.  He denies any pain or itching from the hyperpigmented rash on his palms and soles.  He  denies any rash elsewhere.  He reports continued pain in his left shoulder.      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  He denies any current fever, chills, numbness, tingling, focal weakness, chest pain,  palpitations, shortness of breath, cough, diarrhea, constipation, melena, BRBPR, dysuria, frequency, or other abnormal bleeding.      Physical Exam:     VITAL SIGNS PHYSICAL EXAM   Temp:  [97.3 F (36.3 C)-100.2 F (37.9 C)] 97.9 F (36.6 C)  Heart Rate:  [61-126] 126  Resp Rate:  [16-20] 20  BP: (106-177)/(69-111) 106/69          No intake or output data in the 24 hours ending 06/05/17 1117 Physical Exam  General: awake, alert, NAD  HEENT: anicteric sclerae, PERRL; oropharynx clear, MMM  Cardiovascular: regular rate and rhythm; normal S1 and S2; no murmurs, rubs, or gallops  Lungs: clear to auscultation bilaterally, without wheezing, rhonchi, or rales  Abdomen: normoactive bowel sounds; soft, non-distended, no palpable masses; non-tender to palpation, no rebound or guarding  Extremities: warm, distal pulses 2+ bilaterally; no LE edema; LUE in sling  Neuro: symmetric facial movements, clear speech, moving all extremities  Skin: hyperpigmented and slightly scaly patches on bilateral soles and lesser extent palms, not present elsewhere           Meds:     Medications were reviewed:  Current Facility-Administered Medications   Medication Dose Route Frequency   . LORazepam  1 mg Oral Once     Current Facility-Administered Medications   Medication Dose Route Frequency Last Rate   . sodium chloride   Intravenous Continuous 200 mL/hr at 06/05/17 1026     Current Facility-Administered Medications   Medication Dose Route   . acetaminophen  650 mg Oral   . hydrALAZINE  10 mg Intravenous   . morphine  2 mg Intravenous   . naloxone  0.2 mg Intravenous   . ondansetron  4 mg Intravenous   . oxyCODONE-acetaminophen  1 tablet Oral   . prochlorperazine  5 mg Intravenous          Labs:     Labs (last 72 hours):      Recent Labs  Lab 06/05/17  0330 06/04/17  1753   WBC 12.04* 13.29*   Hgb 13.6 13.0   Hematocrit 39.1* 37.3*   Platelets 227 216            Recent Labs  Lab 06/05/17  0330 06/04/17  1753   Sodium 136 135*   Potassium 3.7 3.4*   Chloride 103 100   CO2 23 22   BUN 10.0 10.0   Creatinine 1.0 1.0   Calcium 9.0 9.2   Albumin 3.6 3.9   Protein, Total 6.3 6.6   Bilirubin, Total 0.9 1.1   Alkaline Phosphatase 52 47   ALT 57* 65*   AST (SGOT) 78* 109*   Glucose 81 92                   Microbiology, reviewed and are significant for:  Microbiology Results     None          Imaging, reviewed and are significant for:  # CXR 2 Views (06/03/17): No pneumothorax. No focal airspace opacity. Pulmonary vascularity is normal. No pleural effusion. Cardiac silhouette and mediastinal contours are within normal. Osseous structures appear intact.  IMPRESSION: No acute disease.    # XR Left Shoulder (06/03/17): On the AP internal and extra sternal rotation views there is a linear density adjacent to the medial aspect of the proximal humerus, inferior to the glenoid region. This may represent a fracture fragment although the donor site is not clearly identified. This density is not localized  on the Y views. No dislocation. Acromioclavicular joint is within normal. IMPRESSION: Linear density adjacent to the medial aspect of the left proximal humerus, inferior to the glenoid seen only on the 2 AP views. This may represent a bony fracture fragment although the donor site is not identified. Consider CT for further evaluation.    # CTA Chest and CT Abdomen/Pelvis (06/04/17): CHEST: Heart size is normal. There is no pericardial effusion. Aorta is normal in caliber. Is no focal aneurysmal dilatation or dissection. There are no enlarged mediastinal or hilar lymph nodes. There is no focal consolidation, pleural effusion, atelectasis or pneumothorax. Central airways are patent. No pulmonary emboli are  identified. Skeletal structures appear normal.  ABDOMEN: No abnormality is identified in the liver. Gallbladder is distended without thickened wall. Fundus of the gallbladder is just above the umbilicus in the anterior abdomen. No inflammatory change of the pancreas is seen. The spleen and adrenal glands appear normal. There is no hydronephrosis or pyelonephritis in either the right or left kidney. Vascular structures appear normal. There are no enlarged retroperitoneal lymph nodes. Stomach is distended with oral contrast. Small bowel is normal in caliber. Oral contrast has reached the colon. Patient is status post appendectomy. No inflammatory change of the colon is seen. There is limited evaluation of bowel mesentery due to decreased intra-abdominal adipose tissue. Bladder is distended. There is no free fluid or free air. No skeletal abnormalities identified.  IMPRESSION: 1. No pulmonary emboli identified. 2. No acute intra-abdominal or pelvic abnormality to account for patient's abdominal symptoms identified.    # CT Left Shoulder (06/04/17): There is a Hill-Sachs fracture deformity of the superior posterolateral humeral head with associated contour deformity. A small ossific fragment adjacent to the anterior/inferior glenoid rim may represent a small osseous Bankart fracture.  There is a moderate glenohumeral joint effusion with foci of calcification, loose bodies, and/or fracture fragments in the axillary recess. Other areas of mild hyperdensity in the joint capsule may represent hemorrhage or calcification.  No other acute displaced fracture is identified. The alignment is normal.  The visualized soft tissues are otherwise unremarkable.  IMPRESSION: Hill-Sachs fracture deformity of the humeral head. A small ossific fragment adjacent to the anterior/inferior glenoid rim may represent a small osseous Bankart fracture. These findings are suggestive of prior anterior glenohumeral instability, which is of uncertain  chronicity.  Moderate glenohumeral joint effusion with foci of calcification, loose bodies, and/or fracture fragments in the axillary recess. Other areas of mild hyperdensity in the joint capsule may represent hemorrhage or calcification.        Signed by: Della Goo, MD PhD

## 2017-06-05 NOTE — UM Notes (Signed)
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 Na 135, K 4.4, BUN 9, Cr 1.2. AST 173/ALT 78, Alk phos 46. D-dimer 1.52. UA showed +glucose.     CK 6778    IVF 200cc/hr     Imaging: CXR: no acute disease   Shoulder, left XR: bony fracture fragment possible in medial aspect of left humerus    GI CONSULT:   . Monitor H/H and for recurrent GI bleed. Transfuse PRN.   2. Diet clear liquids. May advance as tolerated to bland diet if no recurrent bleeding.   3. Pantoprazole 40 mg   4. Counseled pt regarding cannabis and alcohol use, as this is likely exacerbating his symptoms.   5. There are no plans for endoscopic intervention. Plan as above.   6. Avoid NSAIDs.   7. Other medical management per primary team.     PLAN:   Hematemesis: H/H stable, hemodynamically stable. No acute findings on CXR. Likely result of Mallory Weiss tear from intractable N/V though possibility of PE remains on differential given elevated D-dimer, 1.52, it is less likely given lack of risk factors (immobility/recent surgery).   -continue to monitor for further episodes  -monitor CBC q8h  -obtain type/screen  -follow up on CTA, CT abd/pelvis ordered in ED  -IV compazine 5mg  q4h prn nausea    Rash: involvement of palms, soles; has been ongoing. 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 Less likely RMSF though should consider if not improving.  -s/p IV penicillin in ED  -check RPR     Transaminitis:AST 173/ALT 78, Alk phos 46; T.bili mildly elevated, 1.3; possibly related to EtOH use given AST/ALT, 2:1 ratio.  -check hepatitis panel    -continue to monitor LFT's q AM  -consider RUQ u/s if not improving     Left shoulder dislocation: possible fracture on shoulder XR  -CT of left shoulder pending    H/o Bipolar disorder   -pt not taking any medications     Depression  -pt not taking sertraline anymore    ? Hyperthyroidism: TSH 0.30  -check free T4    H/o EtOH abuse  -CIWA scoring protocol  -urine drug tox    F po   E replete prn   N npo    DVT ppx scd's only      Today's date: 06/04/2017  Admit Date: 06/03/2017 11:15 PM  Clinical Milestones: etiology and Rx UGIB, rash  Anticipated discharge needs: GI, ID, PMD f/up      HOSPITAL DAY 2,06/05/17    169/107--126--20--97.9--99% RA    IVF 200cc/hr     Start CLD    H/H 13.6/ 39   WBC 12    Apresoline IV x2 in last 24H  Morphine IV x4 in last 24H   zofran IV x3 in last 24H   Compazine IV x4 in last 24H     Appreciate GI recs  -- Trend CBC and monitor for bleeding  -- No current plan for endoscopy  -- Clear liquid  diet, advance if tolerated  -- Continue Pantoprazole 40 mg IV daily  -- Antiemetics as needed  -- Avoid all NSAIDs    Jamal Collin RN, BSN  [facility]  [place].  [phone] VM  [phone] FX

## 2017-06-05 NOTE — Progress Notes (Addendum)
Patient noted in bed and restless. No sleep noted during the nap except for short naps after pain medication administration. One episode noted of patient dislocating shoulder from joint. Patient manipulated shoulder and it pops back into place. Patient requests all medication available for nausea and morphine for pain. Pt takes 2 showers during the night, asks for nail clippers and q-tips. Pt instructed to rest and decrease movement of left arm.

## 2017-06-06 ENCOUNTER — Encounter: Payer: Self-pay | Admitting: Hospitalist

## 2017-06-06 DIAGNOSIS — G43A0 Cyclical vomiting, not intractable: Secondary | ICD-10-CM

## 2017-06-06 DIAGNOSIS — K226 Gastro-esophageal laceration-hemorrhage syndrome: Principal | ICD-10-CM

## 2017-06-06 DIAGNOSIS — F1220 Cannabis dependence, uncomplicated: Secondary | ICD-10-CM | POA: Diagnosis present

## 2017-06-06 DIAGNOSIS — F070 Personality change due to known physiological condition: Secondary | ICD-10-CM

## 2017-06-06 DIAGNOSIS — S069X9S Unspecified intracranial injury with loss of consciousness of unspecified duration, sequela: Secondary | ICD-10-CM

## 2017-06-06 HISTORY — DX: Gastro-esophageal laceration-hemorrhage syndrome: K22.6

## 2017-06-06 LAB — CBC
Absolute NRBC: 0 10*3/uL
Hematocrit: 41.7 % — ABNORMAL LOW (ref 42.0–52.0)
Hgb: 13.9 g/dL (ref 13.0–17.0)
MCH: 31.1 pg (ref 28.0–32.0)
MCHC: 33.3 g/dL (ref 32.0–36.0)
MCV: 93.3 fL (ref 80.0–100.0)
MPV: 12.1 fL (ref 9.4–12.3)
Nucleated RBC: 0 /100 WBC (ref 0.0–1.0)
Platelets: 254 10*3/uL (ref 140–400)
RBC: 4.47 10*6/uL — ABNORMAL LOW (ref 4.70–6.00)
RDW: 13 % (ref 12–15)
WBC: 11.19 10*3/uL — ABNORMAL HIGH (ref 3.50–10.80)

## 2017-06-06 LAB — HEPATIC FUNCTION PANEL
ALT: 56 U/L — ABNORMAL HIGH (ref 0–55)
AST (SGOT): 53 U/L — ABNORMAL HIGH (ref 5–34)
Albumin/Globulin Ratio: 1.3 (ref 0.9–2.2)
Albumin: 4.1 g/dL (ref 3.5–5.0)
Alkaline Phosphatase: 52 U/L (ref 38–106)
Bilirubin Direct: 0.4 mg/dL (ref 0.0–0.5)
Bilirubin Indirect: 0.5 mg/dL (ref 0.0–1.0)
Bilirubin, Total: 0.9 mg/dL (ref 0.1–1.2)
Globulin: 3.1 g/dL (ref 2.0–3.7)
Protein, Total: 7.2 g/dL (ref 6.0–8.3)

## 2017-06-06 LAB — BASIC METABOLIC PANEL
BUN: 9 mg/dL (ref 9.0–28.0)
CO2: 26 mEq/L (ref 21–29)
Calcium: 9.6 mg/dL (ref 8.5–10.5)
Chloride: 103 mEq/L (ref 100–111)
Creatinine: 1.2 mg/dL (ref 0.5–1.5)
Glucose: 90 mg/dL (ref 70–100)
Potassium: 4.5 mEq/L (ref 3.5–5.1)
Sodium: 136 mEq/L (ref 136–145)

## 2017-06-06 LAB — CK: Creatine Kinase (CK): 1329 U/L — ABNORMAL HIGH (ref 47–267)

## 2017-06-06 LAB — GFR: EGFR: >60

## 2017-06-06 LAB — HEMOLYSIS INDEX: Hemolysis Index: 23 — ABNORMAL HIGH (ref 0–18)

## 2017-06-06 NOTE — Progress Notes (Signed)
Orthopedic Trauma Daily Progress Note    06/06/2017   10:17 AM    Joel [name]. is a 26 y.o. male       Subjective: Patient alert and oriented. States pain is well tolerated.  Patient up and about and dancing in his room using his left arm/shoulder for activities.  Denies any numbness or tingling.  Patient Denies nausea, vomiting, or fevers.     Physical Exam:  Vitals:    06/06/17 0816   BP: 127/84   Pulse: 79   Resp:    Temp: 97.5 F (36.4 C)   SpO2: 100%      No intake or output data in the 24 hours ending 06/06/17 1017  Results     Procedure Component Value Units Date/Time    CBC without differential [[id number]]  (Abnormal) Collected:  06/06/17 0248    Specimen:  Blood from Blood Updated:  06/06/17 0450     WBC 11.19 (H) x10 3/uL      Hgb 13.9 g/dL      Hematocrit 64.4 (L) %      Platelets 254 x10 3/uL      RBC 4.47 (L) x10 6/uL      MCV 93.3 fL      MCH 31.1 pg      MCHC 33.3 g/dL      RDW 13 %      MPV 12.1 fL      Nucleated RBC 0.0 /100 WBC      Absolute NRBC 0.00 x10 3/uL     Hepatic function panel (LFT) [[id number]]  (Abnormal) Collected:  06/06/17 0248    Specimen:  Blood Updated:  06/06/17 0426     Bilirubin, Total 0.9 mg/dL      Bilirubin, Direct 0.4 mg/dL      Bilirubin, Indirect 0.5 mg/dL      AST (SGOT) 53 (H) U/L      ALT 56 (H) U/L      Alkaline Phosphatase 52 U/L      Protein, Total 7.2 g/dL      Albumin 4.1 g/dL      Globulin 3.1 g/dL      Albumin/Globulin Ratio 1.3    Basic Metabolic Panel [[id number]] Collected:  06/06/17 0248    Specimen:  Blood Updated:  06/06/17 0426     Glucose 90 mg/dL      BUN 9.0 mg/dL      Creatinine 1.2 mg/dL      Calcium 9.6 mg/dL      Sodium 295 mEq/L      Potassium 4.5 mEq/L      Chloride 103 mEq/L      CO2 26 mEq/L     CREATINE KINASE LEVEL (CK) [[id number]]  (Abnormal) Collected:  06/06/17 0248    Specimen:  Blood Updated:  06/06/17 0426     Creatine Kinase (CK) 1,329 (H) U/L     Hemolysis index [[id number]]  (Abnormal) Collected:  06/06/17 0248     Updated:   06/06/17 0426     Hemolysis Index 23 (H)    GFR [[id number]] Collected:  06/06/17 0248     Updated:  06/06/17 0426     EGFR >60.0    RPR (Reflex to Titer and Confirmation) [[id number]] Collected:  06/04/17 0142     Updated:  06/05/17 1843     RPR Non Reactive          Left Upper Extremity:   skin intact                                                                                                                          +  AIN/PIN  wiggles fingers well  SILT r/m/u  SILT to fingers  2+ radial  Cap refill <2 secs          Assessment: s/p L shoulder instability    Plan:   Mobility: Out of bed as tolerated with PT/OT   Pain control: Continue to wean/titrate to appropriate oral regimen   DVT Prophylaxis (per Ortho Trauma service Protocol): DVT prophylaxis is not indicated from an Orthopaedic perspective   Foley catheter status: Does not have Foley   Further surgical plans: No further Orthopaedic plans      RUE: WBAT   LUE:  NWB   RLE:  WBAT   LLE:  WBAT   Disposition: Stable from Orthopaedic perspective, dispo per Primary team    Ambrose Pancoast, PA-C  Orthopedic Trauma Physician Assistant  Page [phone] or call [phone] for questions

## 2017-06-06 NOTE — Discharge Summary (Signed)
Discharge Summary    Date:06/06/2017   Patient Name: Joel [name], Joel [name].  Attending Physician: No att. providers found    Date of Admission:   06/03/2017    Date of Discharge:   10/8    Admitting Diagnosis:   Nausea, vomiting, hemetemesis    Discharge Dx:     Principal Diagnosis (Diagnosis after study, that is chiefly responsible for admission to inpatient status): Hematemesis  Active Hospital Problems    Diagnosis POA   . Principal Problem: Hematemesis Yes   . Marijuana dependence Yes   . Mallory-Weiss tear Yes   . Cyclical vomiting with nausea Yes   . Rash Yes   . Left shoulder pain Yes   . Abnormal LFTs Yes   . Polysubstance abuse Yes      Resolved Hospital Problems    Diagnosis POA   No resolved problems to display.       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     1) Hematemesis/N/V: presented w/ 5 days of intractable nausea, vomiting, and RUQ  pain with small volume hematemesis. CTA chest neg for PE. CT abdomen/pelvis w/ distended gallbladder w/o wall thickening and distended stomach with oral contrast. Per GI, no endoscopy indicated.  His symptoms likely from use of alcohol and daily marijuana use --> mallory weiss tear. H&h remained stable. Treated w/ pantoprazole 40 mg IV, can take PPI for 2 weeks on discharge. Counseled on no NSAIDs for now.     2) Abnormal LFTs: AST 173, ALT 78, ALK 46, Alb 3.9, and TBili 1.3 on admission, gradually improved. Negative HAV, HBV, HCV, HIV serologies on  admission.  His liver appered normal on CT abdomen/pelvis. Due to alcohol use?    3) Hyperpigmented Rash on Palms and Soles: Scaly rash on his palms and soles.  There was concern for secondary syphilis, and he was given Penicillin G 2.4 million units IV once in the ED. RPR neg.     4) Left Shoulder Dislocation: Recently seen at OSH for dislocation of his left shoulder, reports continued pain, pops in and out chronically. XR left shoulder w/ possible bony fracture fragment on the AP views. CT left shoulder showed a Hill-Sachs deformity and small Bankart fracture. Evaluated by Orthopedics, given sling and close outpatient followup in clinic. Avoid external rotation.    5) Chronic issues  - TBI History / Bipolar Disorder History: No longer taking Sertraline and Quetiapine. Interested in establishing care with Psychiatry. Seen by Psychiatry and given substance abuse resources. No SI/HI.   - Substance Abuse: Cocaine, marijuana, alcohol abuse.  His UTox on admission was positive for cannabinoids. No withdrawal symptoms.     Condition at Discharge:     Stable at discharge home    Today:     BP 110/70   Pulse 76   Temp 97.3 F (36.3 C) (Oral)   Resp 16   Ht 1.753 m (5\' 9" )   Wt 59 kg (130 lb)  SpO2 100%   BMI 19.20 kg/m   Ranges for the last 24 hours:  Temp:  [97.2 F (36.2 C)-98.2 F (36.8 C)] 97.3 F (36.3 C)  Heart Rate:  [74-90] 76  Resp Rate:  [16-19] 16  BP: (110-160)/(70-103) 110/70     General: awake, alert, oriented x 3; NAD  HEENT: perrla, eomi, oropharynx clear, mucous membranes moist  Neck: supple, no lymphadenopathy, no thyromegaly, no JVD, no carotid bruits  Cardiovascular: regular rate and rhythm, no murmurs, rubs or gallops  Lungs: clear to auscultation bilaterally, without wheezing, rhonchi, or rales  Abdomen: soft, non tender, non-distended; no palpable masses, no hepatosplenomegaly, normoactive bowel sounds, no rebound or guarding  Extremities: no clubbing, cyanosis, or edema, left arm  in sling  Neuro: cranial nerves grossly intact, non focal  Skin: palms w/ dry scaly lesions        Micro / Labs / Path pending:     Unresulted Labs     None          Discharge Instructions:     Follow-up Information     Schwartzbach, Theresia Lo, MD Follow up in 1 week(s).    Specialty:  Orthopaedic Surgery  Contact information:  [address]  [place] [address]  [phone]                   Discharge Diet: Regular Diet    Disposition:  Home or Self Care     Discharge Medication List      STOP taking these medications    sertraline 50 MG tablet  Commonly known as:  ZOLOFT          Minutes spent coordinating discharge and reviewing discharge plan: 50 minutes      Signed by: Teena Mangus, DO

## 2017-06-06 NOTE — Plan of Care (Signed)
Pt discharged to home. Instructions provided on followup appointments and completing medications as noted on AVS by MD. Discharge instructions given to pt at bedside. Pt verbalizes understanding. No new medications upon discharge. IV removed. Discharge orders complete. Pt discharged with personal belongings. Pt walked out of unit to meet mother downstairs.
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
## 2017-06-06 NOTE — Malnutrition Assessment (Signed)
Kinsler Soeder. is a 26 y.o. male patient.   [id number]      Malnutrition Documentation    Intake Moderate Protein Energy Malnutrition related to hematemesis and poor appetite as evidenced by 11% wt loss x 2 months and poor PO intake of <75% needs for at least 1 week.       Meryle Ready, RD  Spectra [phone]      If physician disagrees with this assessment see addendum.
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
## 2017-06-06 NOTE — Discharge Instr - AVS First Page (Addendum)
Reason for your Hospital Admission:  Gastritis from vomiting  Left shoulder dislocation (chronic)    Instructions for after your discharge:  Take prilosec 20 mg OTC one pill daily for two weeks to help with your gastritis  Take tylenol for shoulder pain  Follow up with Primary Health Care Provider (PCP) for depression  Follow up with orthopedics

## 2017-06-06 NOTE — Plan of Care (Addendum)
Medicine Progress Note:  Pending ride to home later today. Currently resting with no needs expressed at this time. Will continue to monitor    Neuro: A&Ox4. Overall calm and cooperative with care  Cardiac: Denies chest pain or discomfort  Pulm: WNL  Skin: Lesions to palms of hands and soles of feet. MD aware. No drainage noted  GI/GU: WNL  Mobility: Ambulates independently in room and outside of unit per MD order. Pt returns upon leaving floor to get "fresh air"  Active Lines: None. D/c    Vitals:    06/06/17 0816   BP: 127/84   Pulse: 79   Resp:    Temp: 97.5 F (36.4 C)   SpO2: 100%       Problem: Safety  Goal: Patient will be free from injury during hospitalization  Outcome: Progressing   06/06/17 1444   Goal/Interventions addressed this shift   Patient will be free from injury during hospitalization  Assess patient's risk for falls and implement fall prevention plan of care per policy;Provide and maintain safe environment   Purposeful rounding. Bed on low position. Floor mat at bedside. Low fall risk. 3/4 side rails. Nonskid socks on. Call bell within pt's reach. Safety precautions enforced.    Problem: Discharge Barriers  Goal: Patient will be discharged home or other facility with appropriate resources  Outcome: Progressing   06/06/17 1444   Goal/Interventions addressed this shift   Discharge to home or other facility with appropriate resources Initiate discharge planning   Pt pending discharge today.

## 2017-06-06 NOTE — PT Eval Note (Signed)
[facility]   Physical Therapy Evaluation   Patient: Joel [name].    MRN#: [id number]   Unit: [facility] TOWER 10 EAST  Bed: F1013/F1013.01    Discharge Recommendations:   Discharge Recommendation: Home with no needs   DME Recommendation: DME Recommended for Discharge:  (none)    Assessment:   Toran Murch. is a 26 y.o. male admitted 06/03/2017 for hemoptysis.  Pt is at baseline w/ mobility.  There are a few comorbidities or other factors that affect plan of care and require modification of task including substance abuse and bipolar  Standardized tests and exams incorporated into evaluation include AMPAC mobility, balance, Strength and pain.  Pt demonstrates a stable clinical presentation.       No skilled PT needs at this time. Please reconsult again if there is any significant changes in functional mobility.Will complete this order.  Patient left without needs and call bell within reach. RN notified of session outcome.     Treatment Activities: eval only  Educated the patient to role of physical therapy and UE WB and act precaution.    Plan:   Treatment/Interventions: No skilled interventions needed at this time    PT Frequency:  (d/c PT)    Risks/Benefits/POC Discussed with Pt/Family: With patient       Precautions and Contraindications:  falls  NWB L UE (sling)  Limit L UE ER    Consult received for [name]. for PT Evaluation and Treatment.  Patient's medical condition is appropriate for Physical therapy intervention at this time.    Medical Diagnosis: Rash [R21]  Right upper quadrant abdominal pain [R10.11]  Hemoptysis [R04.2]  Hemoptysis [R04.2]    History of Present Illness:   Joel [name]. is a 26 y.o. male admitted on 06/03/2017 with "who presents to the hospital with c/o hematemesis. Pt has had approximately 8 hours of hematemesis prior to admission and associated RUQ abdominal pain for the past three days that is cramping in nature and better with hot  showers. He has been having associated intractable N/V since Tuesday with approximately five NBNB emesis per day with know identified trigger, such as food item. Today he developed streaks of blood in his emesis however. He's had associated decreased po intake and fatigue. He was recently admitted to [facility] hospital for the N/V and dislocation of his left shoulder and was released yesterday. He has had no fever, chills, chest pain, sob. No sick contacts." per H&P    Past Medical/Surgical History:  PTX , EtOH use, substance abuse (cocaine), Bipolar d/o,     X-Rays/Tests/Labs:  XR Shoulder Left 2+ Views  IMPRESSION:    Hill-Sachs deformity along the humeral head. Small bony  density inferior to the glenoid is less well visualized on these  portable radiographs    Social History:   Prior Level of Function:  Baseline Activity Level:amb w/o AD, indep w/ care  DME Currently at Home:none    Home Arrangement:  Living Arrangement:step dad, mother  Type of Home:2 level  Home Living Notes/Comments: 1 FOS to basement    Subjective:   Patient is agreeable to participation in the therapy session. Nursing clears patient for therapy.     Pt Goal:return home    Pain:  Denies pain    Objective:   Patient is out of bed, ambulating with sling in place.    Musculoskeletal Examination:  R UE ROM:wfl  L UE ROM:wfl except SH nt  R UE  Strength:wfl  L UE Strength:wfl except SH nt    R LE ROM:wfl  L LE ROM:wfl  R LE Strength: wfl  L LE Strength: wfl      Functional Mobility:  Bed Mobility   Supine to ZOX:WRUEA  Sit to Supine:indep  Sit to Stand: indep  Stand to VWU:JWJXB    Ambulation:  PMP Activity: Step 7 walks out fo the room  Distance:300  Assist:indep  Assistive Device:none  Pattern:wfl, fast paced  Number of Stairs: 1 FOS no HR reciprocal  Stairs Assist:indep     Balance:  Standing Static:Good w/o AD  Standing Dynamic:Good w/o AD    Participation:Good  Endurance:Good    AM-PACT Inpatient Short Forms  Inpatient AM-PACT Performed? (PT): Basic  Mobility Inpatient Short Form  AM-PACT "6 Clicks" Basic Mobility Inpatient Short Form  Turning Over in Bed: None  Sitting Down On/Standing From Armchair: None  Lying on Back to Sitting on Side of Bed: None  Assist Moving to/from Bed to Chair: None  Assist to Walk in Hospital Room: None  Assist to Climb 3-5 Steps with Railing: None  PT Basic Mobility Raw Score: 24  CMS 0-100% Score: 0.00%    Patient left with call bell within reach, all needs met,  SCD: off  Fall mat: off  Bed alarm: n/a  Chair alarm:n/a    and all questions answered. RN notified of session outcome and patient response.    Matilde Sprang, PT   Pager ID# [phone] 06/06/2017, 11:05 AM    Time of treatment:   PT Received On: 06/06/17  Start Time: 1048  Stop Time: 1105  Time Calculation (min): 17 min

## 2017-06-06 NOTE — Plan of Care (Addendum)
Medicine B Progress Notes: Night Shift     x1 percocet and x2 morphine given for left shoulder pain   x1 compazine given for nausea    Neuro: Pt is alert and oriented x4, follow commands.   Respiratory: Lung sounds are clear to auscultation, respirations unlabored. 100% on room air, no desats or SOB.  Card:  Regular rate and rhythm   GI: Abdomen is soft/tender, bowel sounds are present.  GU: Voids q shift  Skin: Intact  Mobility: Ambulates independently   Active Lines: Left ac is clean, dry, and intact    Vitals:    06/05/17 1201 06/05/17 1526 06/05/17 1919 06/05/17 2002   BP: 160/89 125/82 (!) 160/103 (!) 156/95   Pulse:  86 85 90   Resp:  20 19    Temp:  98.2 F (36.8 C) 98.2 F (36.8 C)    TempSrc:  Oral Oral    SpO2:  98% 100%    Weight:       Height:             Problem: Pain  Goal: Pain at adequate level as identified by patient  Outcome: Progressing   06/06/17 0112   Goal/Interventions addressed this shift   Pain at adequate level as identified by patient Identify patient comfort function goal;Assess for risk of opioid induced respiratory depression, including snoring/sleep apnea. Alert healthcare team of risk factors identified.;Assess pain on admission, during daily assessment and/or before any "as needed" intervention(s);Reassess pain within 30-60 minutes of any procedure/intervention, per Pain Assessment, Intervention, Reassessment (AIR) Cycle;Evaluate patient's satisfaction with pain management progress;Evaluate if patient comfort function goal is met;Offer non-pharmacological pain management interventions;Consult/collaborate with Pain Service       Problem: Side Effects from Pain Analgesia  Goal: Patient will experience minimal side effects of analgesic therapy  Outcome: Progressing   06/06/17 0112   Goal/Interventions addressed this shift   Patient will experience minimal side effects of analgesic therapy Monitor/assess patient's respiratory status (RR depth, effort, breath sounds);Assess for changes  in cognitive function;Evaluate for opioid-induced sedation with appropriate assessment tool (i.e. POSS);Prevent/manage side effects per LIP orders (i.e. nausea, vomiting, pruritus, constipation, urinary retention, etc.)

## 2017-06-13 ENCOUNTER — Ambulatory Visit (INDEPENDENT_AMBULATORY_CARE_PROVIDER_SITE_OTHER): Payer: BLUE CROSS/BLUE SHIELD | Admitting: Orthopaedic Surgery

## 2017-06-27 ENCOUNTER — Ambulatory Visit (INDEPENDENT_AMBULATORY_CARE_PROVIDER_SITE_OTHER): Payer: BLUE CROSS/BLUE SHIELD | Admitting: Orthopaedic Surgery

## 2018-07-04 ENCOUNTER — Ambulatory Visit: Payer: Self-pay | Admitting: Adult Health

## 2019-04-08 ENCOUNTER — Encounter: Payer: Self-pay | Admitting: Emergency Medicine

## 2019-04-08 ENCOUNTER — Emergency Department
Admission: EM | Admit: 2019-04-08 | Discharge: 2019-04-08 | Disposition: A | Discharge status: Home / Self Care | Payer: Medicaid Other | Attending: Emergency Medicine | Admitting: Emergency Medicine

## 2019-04-08 ENCOUNTER — Other Ambulatory Visit: Payer: Self-pay

## 2019-04-08 DIAGNOSIS — K029 Dental caries, unspecified: Secondary | ICD-10-CM | POA: Insufficient documentation

## 2019-04-08 DIAGNOSIS — K0889 Other specified disorders of teeth and supporting structures: Secondary | ICD-10-CM | POA: Diagnosis present

## 2019-04-08 MED ORDER — TRAMADOL HCL 50 MG PO TABS: 50.0000 mg | ORAL_TABLET | Freq: Three times a day (TID) | ORAL | 0 refills | Status: AC | PRN

## 2019-04-08 MED ORDER — LIDOCAINE-EPINEPHRINE 2 %-1:100000 IJ SOLN
1.7000 mL | Freq: Once | INTRAMUSCULAR | Status: AC
  Administered 2019-04-08: 1.7 mL
  Filled 2019-04-08: qty 1.7

## 2019-04-08 MED ORDER — LIDOCAINE VISCOUS HCL 2 % MT SOLN
15.0000 mL | Freq: Once | OROMUCOSAL | Status: AC
  Administered 2019-04-08: 15 mL via OROMUCOSAL
  Filled 2019-04-08: qty 15

## 2019-04-08 MED ORDER — AMOXICILLIN 500 MG PO CAPS: 500.0000 mg | ORAL_CAPSULE | Freq: Three times a day (TID) | ORAL | 0 refills | Status: DC

## 2019-04-08 MED ORDER — AMOXICILLIN 500 MG PO CAPS
500.0000 mg | ORAL_CAPSULE | Freq: Once | ORAL | Status: AC
  Administered 2019-04-08: 500 mg via ORAL
  Filled 2019-04-08: qty 1

## 2019-04-08 NOTE — ED Notes (Signed)
Registration requested that pt turn music down and he refused - this nurse requested pt turn music down Can still hear music at nursing station

## 2019-04-08 NOTE — ED Provider Notes (Signed)
[facility] Emergency Department Provider Note ____________________________________________  Time seen: 1012  I have reviewed the triage vital signs and the nursing notes.  HISTORY  Chief Complaint  Dental Pain  HPI Steve Cooley is a 28 y.o. male presents to the ED for evaluation of dental pain. He notes poor dentition, and recalls he chipped his molar last week. He denies any facial swelling, fevers, or difficulty controlling oral secretions. He reports onset of pain this morning to the upper right molars.    History reviewed. No pertinent past medical history.  There are no active problems to display for this patient.  History reviewed. No pertinent surgical history.  Prior to Admission medications   Medication Sig Start Date End Date Taking? Authorizing Provider  amoxicillin (AMOXIL) 500 MG capsule Take 1 capsule (500 mg total) by mouth 3 (three) times daily. 04/08/19   Adeena Bernabe, Dannielle Karvonen, PA-C  traMADol (ULTRAM) 50 MG tablet Take 1 tablet (50 mg total) by mouth 3 (three) times daily as needed for up to 2 days. 04/08/19 04/10/19  Jenesys Casseus, Dannielle Karvonen, PA-C    Allergies Patient has no known allergies.  History reviewed. No pertinent family history.  Social History Social History   Tobacco Use  . Smoking status: Not on file  Substance Use Topics  . Alcohol use: Not on file  . Drug use: Not on file    Review of Systems  Constitutional: Negative for fever. Eyes: Negative for visual changes. ENT: Negative for sore throat.  Dental pain as above. Cardiovascular: Negative for chest pain. Respiratory: Negative for shortness of breath. Gastrointestinal: Negative for abdominal pain, vomiting and diarrhea. Genitourinary: Negative for dysuria. Musculoskeletal: Negative for back pain. Skin: Negative for rash. Neurological: Negative for headaches, focal weakness or numbness. ____________________________________________  PHYSICAL EXAM:  VITAL  SIGNS: ED Triage Vitals  Enc Vitals Group     BP 04/08/19 0916 126/85     Pulse Rate 04/08/19 0916 63     Resp 04/08/19 0916 16     Temp 04/08/19 0916 98.8 F (37.1 C)     Temp Source 04/08/19 0916 Oral     SpO2 04/08/19 0916 97 %     Weight 04/08/19 0914 120 lb (54.4 kg)     Height 04/08/19 0914 5\' 7"  (1.702 m)     Head Circumference --      Peak Flow --      Pain Score 04/08/19 0913 10     Pain Loc --      Pain Edu? --      Excl. in [place]? --     Constitutional: Alert and oriented. Well appearing and in no distress. Head: Normocephalic and atraumatic. Eyes: Conjunctivae are normal. Normal extraocular movements Ears: Canals clear. TMs intact bilaterally. Nose: No congestion/rhinorrhea/epistaxis. Mouth/Throat: Mucous membranes are moist.  Uvula is midline and tonsils are flat.  No oropharyngeal lesions are appreciated.  No brawny sublingual erythema is noted.  Patient with tenderness to palpation to the right upper molars without any focal gum swelling, pointing, or fluctuance appreciated. Neck: Supple. No thyromegaly. Hematological/Lymphatic/Immunological: No cervical lymphadenopathy. Cardiovascular: Normal rate, regular rhythm. Normal distal pulses. Respiratory: Normal respiratory effort.  Musculoskeletal: Nontender with normal range of motion in all extremities.  Neurologic:  Normal gait without ataxia. Normal speech and language. No gross focal neurologic deficits are appreciated. Skin:  Skin is warm, dry and intact. No rash noted. ____________________________________________  PROCEDURES Amoxicillin 500 mg PO Viscous lido 15 mg PO  Dental Block  Date/Time: 04/08/2019 2:55 PM Performed by: Lissa HoardMenshew, Abhiraj Dozal V Bacon, PA-C Authorized by: Lissa HoardMenshew, Breana Litts V Bacon, PA-C   Consent:    Consent obtained:  Verbal   Consent given by:  Patient   Risks discussed:  Pain and unsuccessful block Indications:    Indications: dental pain   Location:    Block type:  Supraperiosteal    Supraperiosteal location:  Upper teeth   Upper teeth location:  1/RU 3rd molar and 2/RU 2nd molar Procedure details (see MAR for exact dosages):    Needle gauge:  27 G   Anesthetic injected:  Lidocaine 2% WITH epi   Injection procedure:  Anatomic landmarks identified, anatomic landmarks palpated and incremental injection Post-procedure details:    Outcome:  Anesthesia achieved   Patient tolerance of procedure:  Tolerated well, no immediate complications  ____________________________________________  INITIAL IMPRESSION / ASSESSMENT AND PLAN / ED COURSE  Steve Cooley was evaluated in Emergency Department on 04/08/2019 for the symptoms described in the history of present illness. He was evaluated in the context of the global COVID-19 pandemic, which necessitated consideration that the patient might be at risk for infection with the SARS-CoV-2 virus that causes COVID-19. Institutional protocols and algorithms that pertain to the evaluation of patients at risk for COVID-19 are in a state of rapid change based on information released by regulatory bodies including the CDC and federal and state organizations. These policies and algorithms were followed during the patient's care in the ED.  Patient with ED evaluation of dental pain due to dental caries and a recent dental fracture. He is without focal abscess or facial swelling. He will be treated empirically with amoxicillin and Ultram (#6) for pain. Referral to a local dental provider is given.   I reviewed the patient's prescription history over the last 12 months in the multi-state controlled substances database(s) that includes [place], [place], [place], [place], [place], [place], [place], [place], [place], [place], [place], [place], and [place].  Results were notable for no RX history. ____________________________________________  FINAL CLINICAL IMPRESSION(S) / ED DIAGNOSES  Final diagnoses:  Pain  due to dental caries      Lissa HoardMenshew, Kwabena Strutz V Bacon, PA-C 04/08/19 1506    Dionne BucySiadecki, Sebastian, MD 04/08/19 [phone]

## 2019-04-08 NOTE — ED Notes (Addendum)
Pt in room playing music very loud and singing loudly with the music - he is heard at the nursing station

## 2019-04-08 NOTE — ED Triage Notes (Signed)
Pt reports right bottom dental pain in the back, reports pain started this morning.

## 2019-04-08 NOTE — Discharge Instructions (Signed)
Take the antibiotics as directed, until all pills are gone. You should follow-up with one of the dental clinics listed below.   OPTIONS FOR DENTAL FOLLOW UP CARE  [email or web address] [phone])  [facility] [phone])  [place] [phone])  [place] [phone])  [facility] [phone]) This clinic caters to the indigent population and is on a lottery system. Location: [employer] [facility], [place], [place], [place] [facility] Hours: Wednesdays from 6pm - 9pm, patients seen by a lottery system. For dates, call or go to [email or web address] Services: Cleanings, fillings and simple extractions. Payment Options: DENTAL WORK IS FREE OF CHARGE. Bring proof of income or support. Best way to get seen: Arrive at 5:15 pm - this is a lottery, NOT first come/first serve, so arriving earlier will not increase your chances of being seen.     [facility] [phone] Select option 1 for emergencies   Location: [facility], [place], [address] [place] Clinic Hours: No walk-ins accepted - call the day before to schedule an appointment. Check in times are 9:30 am and 1:30 pm. Services: Simple extractions, temporary fillings, pulpectomy/pulp debridement, uncomplicated abscess drainage. Payment Options: PAYMENT IS DUE AT THE TIME OF SERVICE.  Fee is usually $100-200, additional surgical procedures (e.g. abscess drainage) may be extra. Cash, checks, Visa/MasterCard accepted.  Can file Medicaid if patient is covered for dental - patient should call case worker to check. No discount for [facility] patients. Best way to get seen: MUST call the day before and get onto the  schedule. Can usually be seen the next 1-2 days. No walk-ins accepted.     [place] [phone]   Location: [place], [place] Clinic Hours: M, W, Th, F 8am or 1:30pm, Tues 9a or 1:30 - first come/first served. Services: Simple extractions, temporary fillings, uncomplicated abscess drainage.  You do not need to be an [facility] resident. Payment Options: PAYMENT IS DUE AT THE TIME OF SERVICE. Dental insurance, otherwise sliding scale - bring proof of income or support. Depending on income and treatment needed, cost is usually $50-200. Best way to get seen: Arrive early as it is first come/first served.     [facility] [phone]   Location: [place] [facility] Hours: Mon-Thu 8a-5p Services: Most basic dental services including extractions and fillings. Payment Options: PAYMENT IS DUE AT THE TIME OF SERVICE. Sliding scale, up to 50% off - bring proof if income or support. Medicaid with dental option accepted. Best way to get seen: Call to schedule an appointment, can usually be seen within 2 weeks OR they will try to see walk-ins - show up at [place] or 2p (you may have to wait).     [facility] [place] RESIDENTS ONLY   Location: [facility], [place] [address], [place], [place] [address] Clinic Hours: By appointment only. Monday - Thursday 8am-5pm, Friday 8am-12pm Services: Cleanings, fillings, extractions. Payment Options: PAYMENT IS DUE AT THE TIME OF SERVICE. Cash, Visa or MasterCard. Sliding scale - $30 minimum per service. Best way to get seen: Come in to office, complete packet and make an appointment - need proof of income or support monies for each household member and proof of [facility] residence. Usually  takes about a month to get in.     [facility] [phone]    Location: [address] [place] [facility] Hours: Walk-in Urgent Care Dental Services are offered Monday-Friday mornings only. The numbers of emergencies accepted daily is limited to the number of providers available. Maximum 15 - Mondays, Wednesdays & Thursdays Maximum 10 - Tuesdays & Fridays Services: You do not need to be a [facility] resident to be seen for a dental emergency. Emergencies are defined as pain, swelling, abnormal bleeding, or dental trauma. Walkins will receive x-rays if needed. NOTE: Dental cleaning is not an emergency. Payment Options: PAYMENT IS DUE AT THE TIME OF SERVICE. Minimum co-pay is $40.00 for uninsured patients. Minimum co-pay is $3.00 for Medicaid with dental coverage. Dental Insurance is accepted and must be presented at time of visit. Medicare does not cover dental. Forms of payment: Cash, credit card, checks. Best way to get seen: If not previously registered with the clinic, walk-in dental registration begins at 7:15 am and is on a first come/first serve basis. If previously registered with the clinic, call to make an appointment.     [facility] [place] ONLY   Location: [address] [place], [place] Clinic Hours: Mon-Thu 10a-2p Services: Extractions only! Payment Options: FREE (donations accepted) - bring proof of income or support Best way to get seen: Call and schedule an appointment OR come at 8am on the 1st Monday of every month (except for holidays) when it is first come/first served.     [facility] [phone]   Location: [place], [place] Clinic Hours: Friday mornings Services, Payment Options, Best way to get seen: Call for info

## 2019-04-08 NOTE — ED Notes (Signed)
Pt just turned music back up very loudly and is singing loudly with it - will have provider request him turn it down this time - he is disruptive to the ED and other patients

## 2019-04-08 NOTE — ED Notes (Signed)
Pt c/o dental pain lower back right tooth - pain present since this am - pt reports that a piece of his tooth broke off 1 week ago - no drainage from the area

## 2019-04-15 ENCOUNTER — Other Ambulatory Visit: Payer: Self-pay

## 2019-04-15 ENCOUNTER — Encounter: Payer: Self-pay | Admitting: Intensive Care

## 2019-04-15 ENCOUNTER — Emergency Department
Admission: EM | Admit: 2019-04-15 | Discharge: 2019-04-15 | Disposition: A | Discharge status: Home / Self Care | Payer: Medicaid Other | Attending: Emergency Medicine | Admitting: Emergency Medicine

## 2019-04-15 DIAGNOSIS — F17210 Nicotine dependence, cigarettes, uncomplicated: Secondary | ICD-10-CM | POA: Diagnosis not present

## 2019-04-15 DIAGNOSIS — K0889 Other specified disorders of teeth and supporting structures: Secondary | ICD-10-CM | POA: Diagnosis present

## 2019-04-15 DIAGNOSIS — K029 Dental caries, unspecified: Secondary | ICD-10-CM | POA: Insufficient documentation

## 2019-04-15 MED ORDER — HYDROCODONE-ACETAMINOPHEN 5-325 MG PO TABS: 1.0000 | ORAL_TABLET | Freq: Four times a day (QID) | ORAL | 0 refills | Status: AC | PRN

## 2019-04-15 MED ORDER — KETOROLAC TROMETHAMINE 30 MG/ML IJ SOLN
30.0000 mg | Freq: Once | INTRAMUSCULAR | Status: AC
  Administered 2019-04-15: 30 mg via INTRAMUSCULAR
  Filled 2019-04-15: qty 1

## 2019-04-15 NOTE — Discharge Instructions (Addendum)
Continue taking the amoxicillin that was prescribed for you on 04/08/2019 until you are completely finished.  A prescription for Norco was sent to your pharmacy.  This is a prescription for 2 days only and is to be taken when you are at home.  You cannot drive or operate machinery while taking this medication.  The rest of time you should be taking ibuprofen as needed for dental pain.

## 2019-04-15 NOTE — ED Notes (Signed)
Pt sleeping when I went to assess him

## 2019-04-15 NOTE — ED Notes (Signed)
Pt wishes to speak with Suanne Marker pa before he is discharged. Refused the toradol for Apolonio Schneiders.

## 2019-04-15 NOTE — ED Provider Notes (Signed)
[facility] Emergency Department Provider Note  ____________________________________________   First MD Initiated Contact with Patient 04/15/19 1847     (approximate)  I have reviewed the triage vital signs and the nursing notes.   HISTORY  Chief Complaint Dental Pain   HPI Steve Cooley is a 28 y.o. male presents to the ED with complaint of dental pain.  Patient was seen here 7 days ago at which time he was treated for a dental abscess with 6 tramadol and a prescription for amoxicillin.  Patient states that he is taking the amoxicillin but only had 2 days worth of tramadol and is here for pain medication.  He rates his pain as a 10/10.      History reviewed. No pertinent past medical history.  There are no active problems to display for this patient.   History reviewed. No pertinent surgical history.  Prior to Admission medications   Medication Sig Start Date End Date Taking? Authorizing Provider  amoxicillin (AMOXIL) 500 MG capsule Take 1 capsule (500 mg total) by mouth 3 (three) times daily. 04/08/19   Menshew, Charlesetta IvoryJenise V Bacon, PA-C  HYDROcodone-acetaminophen (NORCO/VICODIN) 5-325 MG tablet Take 1 tablet by mouth every 6 (six) hours as needed for moderate pain or severe pain. 04/15/19   Tommi RumpsSummers, Rhonda L, PA-C    Allergies Patient has no known allergies.  History reviewed. No pertinent family history.  Social History Social History   Tobacco Use  . Smoking status: Current Every Day Smoker    Types: Cigarettes  . Smokeless tobacco: Never Used  Substance Use Topics  . Alcohol use: Never    Frequency: Never  . Drug use: Never    Review of Systems Constitutional: No fever/chills Eyes: No visual changes. ENT: Positive dental pain. Cardiovascular: Denies chest pain. Respiratory: Denies shortness of breath. Skin: Negative for rash. Neurological: Negative for headaches ____________________________________________   PHYSICAL EXAM:   VITAL SIGNS: ED Triage Vitals  Enc Vitals Group     BP 04/15/19 1832 (!) 148/95     Pulse Rate 04/15/19 1832 62     Resp 04/15/19 1832 16     Temp 04/15/19 1832 99.1 F (37.3 C)     Temp Source 04/15/19 1832 Oral     SpO2 04/15/19 1832 100 %     Weight 04/15/19 1833 125 lb (56.7 kg)     Height 04/15/19 1833 5\' 9"  (1.753 m)     Head Circumference --      Peak Flow --      Pain Score 04/15/19 1833 10     Pain Loc --      Pain Edu? --      Excl. in GC? --    Constitutional: Alert and oriented. Well appearing and in no acute distress.  Patient was asleep both when the nurse went to the room for assessment and also when the provider went into examine him. Eyes: Conjunctivae are normal.  Head: Atraumatic. Nose: No congestion/rhinnorhea. Mouth/Throat: Mucous membranes are moist.  Premolars and molars are examined without any focal gum swelling or obvious abscess noted.  No drainage is present. Neck: No stridor.   Hematological/Lymphatic/Immunilogical: No cervical lymphadenopathy. Cardiovascular:  Good peripheral circulation. Respiratory: Normal respiratory effort.  No retractions. Lungs CTAB. Musculoskeletal: No lower extremity tenderness nor edema.  No joint effusions. Neurologic:  Normal speech and language. No gross focal neurologic deficits are appreciated. No gait instability. Skin:  Skin is warm, dry and intact.  Psychiatric: Mood and affect are  normal. Speech and behavior are normal.  ____________________________________________   LABS (all labs ordered are listed, but only abnormal results are displayed)  Labs Reviewed - No data to display   PROCEDURES  Procedure(s) performed (including Critical Care):  Procedures ____________________________________________   INITIAL IMPRESSION / ASSESSMENT AND PLAN / ED COURSE  As part of my medical decision making, I reviewed the following data within the electronic MEDICAL RECORD NUMBER Notes from prior ED visits and [place] Controlled  Substance Database  28 year old presents to the ED with complaint of dental pain.  No obvious abscess was seen and patient states that he is continuing to take the amoxicillin as directed.  He is requesting more pain medication as he was given tramadol for 2 days while he was in the ED.  He states that this was ineffective.  He initially stated that he had a dentist appointment this week but then could not name the dentist nor the day that he is going to see the dentist.  Patient was offered a Toradol injection for pain which he eventually accepted.  Patient was to continue taking the antibiotics and a prescription for Norco 5/325 #6 was sent to the pharmacy for him to pick up.  Patient was told that he would need to follow-up with the dentist this week as he said.  ____________________________________________   FINAL CLINICAL IMPRESSION(S) / ED DIAGNOSES  Final diagnoses:  Pain due to dental caries     ED Discharge Orders         Ordered    HYDROcodone-acetaminophen (NORCO/VICODIN) 5-325 MG tablet  Every 6 hours PRN     04/15/19 2030           Note:  This document was prepared using Dragon voice recognition software and may include unintentional dictation errors.    Johnn Hai, PA-C 04/15/19 2105    Lavonia Drafts, MD 04/16/19 1436

## 2019-04-15 NOTE — ED Notes (Signed)
Pt agreed to shot after talking to pa

## 2019-04-15 NOTE — ED Triage Notes (Signed)
Patient presents with tooth pain. Recently seen this month for same

## 2019-04-16 ENCOUNTER — Encounter: Payer: Self-pay | Admitting: Intensive Care

## 2019-04-16 ENCOUNTER — Emergency Department
Admission: EM | Admit: 2019-04-16 | Discharge: 2019-04-16 | Disposition: A | Discharge status: Home / Self Care | Payer: Medicaid Other | Attending: Emergency Medicine | Admitting: Emergency Medicine

## 2019-04-16 ENCOUNTER — Other Ambulatory Visit: Payer: Self-pay

## 2019-04-16 DIAGNOSIS — R6883 Chills (without fever): Secondary | ICD-10-CM | POA: Diagnosis not present

## 2019-04-16 DIAGNOSIS — F17210 Nicotine dependence, cigarettes, uncomplicated: Secondary | ICD-10-CM | POA: Diagnosis not present

## 2019-04-16 DIAGNOSIS — R112 Nausea with vomiting, unspecified: Secondary | ICD-10-CM | POA: Insufficient documentation

## 2019-04-16 DIAGNOSIS — R197 Diarrhea, unspecified: Secondary | ICD-10-CM | POA: Diagnosis not present

## 2019-04-16 DIAGNOSIS — K529 Noninfective gastroenteritis and colitis, unspecified: Secondary | ICD-10-CM | POA: Insufficient documentation

## 2019-04-16 DIAGNOSIS — R109 Unspecified abdominal pain: Secondary | ICD-10-CM | POA: Diagnosis present

## 2019-04-16 LAB — CBC
HCT: 45 % (ref 39.0–52.0)
Hemoglobin: 15.5 g/dL (ref 13.0–17.0)
MCH: 31.1 pg (ref 26.0–34.0)
MCHC: 34.4 g/dL (ref 30.0–36.0)
MCV: 90.4 fL (ref 80.0–100.0)
Platelets: 248 10*3/uL (ref 150–400)
RBC: 4.98 MIL/uL (ref 4.22–5.81)
RDW: 13.4 % (ref 11.5–15.5)
WBC: 17.2 10*3/uL — ABNORMAL HIGH (ref 4.0–10.5)
nRBC: 0 % (ref 0.0–0.2)

## 2019-04-16 LAB — URINALYSIS, COMPLETE (UACMP) WITH MICROSCOPIC
Bacteria, UA: NONE SEEN
Bilirubin Urine: NEGATIVE
Glucose, UA: NEGATIVE mg/dL
Hgb urine dipstick: NEGATIVE
Ketones, ur: NEGATIVE mg/dL
Leukocytes,Ua: NEGATIVE
Nitrite: NEGATIVE
Protein, ur: 30 mg/dL — AB
Specific Gravity, Urine: 1.019 (ref 1.005–1.030)
pH: 8 (ref 5.0–8.0)

## 2019-04-16 LAB — COMPREHENSIVE METABOLIC PANEL
ALT: 23 U/L (ref 0–44)
AST: 34 U/L (ref 15–41)
Albumin: 5.1 g/dL — ABNORMAL HIGH (ref 3.5–5.0)
Alkaline Phosphatase: 44 U/L (ref 38–126)
Anion gap: 9 (ref 5–15)
BUN: 13 mg/dL (ref 6–20)
CO2: 25 mmol/L (ref 22–32)
Calcium: 10.6 mg/dL — ABNORMAL HIGH (ref 8.9–10.3)
Chloride: 108 mmol/L (ref 98–111)
Creatinine, Ser: 1.12 mg/dL (ref 0.61–1.24)
GFR calc Af Amer: >60 mL/min (ref >60)
GFR calc non Af Amer: >60 mL/min (ref >60)
Glucose, Bld: 126 mg/dL — ABNORMAL HIGH (ref 70–99)
Potassium: 4.1 mmol/L (ref 3.5–5.1)
Sodium: 142 mmol/L (ref 135–145)
Total Bilirubin: 1 mg/dL (ref 0.3–1.2)
Total Protein: 8.4 g/dL — ABNORMAL HIGH (ref 6.5–8.1)

## 2019-04-16 LAB — LIPASE, BLOOD: Lipase: 23 U/L (ref 11–51)

## 2019-04-16 MED ORDER — ONDANSETRON HCL 4 MG/2ML IJ SOLN
4.0000 mg | Freq: Once | INTRAMUSCULAR | Status: AC
  Administered 2019-04-16: 4 mg via INTRAVENOUS
  Filled 2019-04-16: qty 2

## 2019-04-16 MED ORDER — SODIUM CHLORIDE 0.9 % IV SOLN
1000.0000 mL | Freq: Once | INTRAVENOUS | Status: AC
  Administered 2019-04-16: 1000 mL via INTRAVENOUS

## 2019-04-16 MED ORDER — ONDANSETRON 4 MG PO TBDP: 4.0000 mg | ORAL_TABLET | Freq: Three times a day (TID) | ORAL | 0 refills | Status: AC | PRN

## 2019-04-16 MED ORDER — ONDANSETRON HCL 4 MG/2ML IJ SOLN
INTRAMUSCULAR | Status: AC
  Filled 2019-04-16: qty 2

## 2019-04-16 NOTE — ED Notes (Signed)
RN to bedside to discharge patient. Pt reporting the nausea never diminished and is requesting more medication. NO vomiting at this time but pt is moving around in bed and appears uncomfortable.

## 2019-04-16 NOTE — ED Provider Notes (Signed)
[facility] Emergency Department Provider Note   ____________________________________________    I have reviewed the triage vital signs and the nursing notes.   HISTORY  Chief Complaint Abdominal Pain, Nausea, Chills, and Emesis     HPI Steve Cooley is a 28 y.o. male who presents with complaints of abdominal pain nausea vomiting chills, diarrhea which started this morning.  Patient reports he feels very dehydrated.  He reports numerous episodes of vomiting.  He is not able tolerate p.o.'s.  Describes abdominal cramping but no current abdominal pain.  No sick contacts reported.  Does not know if he has had fevers.  Does not believe that he has been around anyone with coronavirus  History reviewed. No pertinent past medical history.  There are no active problems to display for this patient.   History reviewed. No pertinent surgical history.  Prior to Admission medications   Medication Sig Start Date End Date Taking? Authorizing Provider  amoxicillin (AMOXIL) 500 MG capsule Take 1 capsule (500 mg total) by mouth 3 (three) times daily. 04/08/19   Menshew, Charlesetta IvoryJenise V Bacon, PA-C  HYDROcodone-acetaminophen (NORCO/VICODIN) 5-325 MG tablet Take 1 tablet by mouth every 6 (six) hours as needed for moderate pain or severe pain. 04/15/19   Tommi RumpsSummers, Rhonda L, PA-C  ondansetron (ZOFRAN ODT) 4 MG disintegrating tablet Take 1 tablet (4 mg total) by mouth every 8 (eight) hours as needed. 04/16/19   Jene EveryKinner, Townsend Cudworth, MD     Allergies Patient has no known allergies.  History reviewed. No pertinent family history.  Social History Social History   Tobacco Use  . Smoking status: Current Every Day Smoker    Types: Cigarettes  . Smokeless tobacco: Never Used  Substance Use Topics  . Alcohol use: Never    Frequency: Never  . Drug use: Never    Review of Systems  Constitutional: No fever/chills Eyes: No visual changes.  ENT: No sore throat. Cardiovascular:  Denies chest pain. Respiratory: Denies shortness of breath. Gastrointestinal: As above Genitourinary: Negative for dysuria. Musculoskeletal: Negative for back pain. Skin: Negative for rash. Neurological: Negative for headaches   ____________________________________________   PHYSICAL EXAM:  VITAL SIGNS: ED Triage Vitals  Enc Vitals Group     BP 04/16/19 1520 (!) 144/78     Pulse Rate 04/16/19 1518 84     Resp 04/16/19 1518 16     Temp 04/16/19 1518 98.2 F (36.8 C)     Temp Source 04/16/19 1518 Oral     SpO2 04/16/19 1518 100 %     Weight 04/16/19 1518 56.7 kg (125 lb)     Height 04/16/19 1518 1.753 m (5\' 9" )     Head Circumference --      Peak Flow --      Pain Score 04/16/19 1518 8     Pain Loc --      Pain Edu? --      Excl. in GC? --     Constitutional: Alert and oriented Eyes: Conjunctivae are normal.   Nose: No congestion/rhinnorhea. Mouth/Throat: Mucous membranes are moist.    Cardiovascular: Normal rate, regular rhythm. Grossly normal heart sounds.  Good peripheral circulation. Respiratory: Normal respiratory effort.  No retractions. Lungs CTAB. Gastrointestinal: Soft and nontender. No distention.   Musculoskeletal: No lower extremity tenderness nor edema.  Warm and well perfused Neurologic:  Normal speech and language. No gross focal neurologic deficits are appreciated.  Skin:  Skin is warm, dry and intact. No rash noted. Psychiatric: Mood and  affect are normal. Speech and behavior are normal.  ____________________________________________   LABS (all labs ordered are listed, but only abnormal results are displayed)  Labs Reviewed  COMPREHENSIVE METABOLIC PANEL - Abnormal; Notable for the following components:      Result Value   Glucose, Bld 126 (*)    Calcium 10.6 (*)    Total Protein 8.4 (*)    Albumin 5.1 (*)    All other components within normal limits  CBC - Abnormal; Notable for the following components:   WBC 17.2 (*)    All other  components within normal limits  URINALYSIS, COMPLETE (UACMP) WITH MICROSCOPIC - Abnormal; Notable for the following components:   Color, Urine YELLOW (*)    APPearance CLEAR (*)    Protein, ur 30 (*)    All other components within normal limits  LIPASE, BLOOD   ____________________________________________  EKG   ____________________________________________  RADIOLOGY   ____________________________________________   PROCEDURES  Procedure(s) performed: No  Procedures   Critical Care performed: No ____________________________________________   INITIAL IMPRESSION / ASSESSMENT AND PLAN / ED COURSE  Pertinent labs & imaging results that were available during my care of the patient were reviewed by me and considered in my medical decision making (see chart for details).  Patient symptoms suspicious for viral gastroenteritis, we will place IV give IV Zofran and normal saline and reevaluate.  Lab work overall reassuring, elevated white blood cell count often seen with viral gastroenteritis.  No abdominal tenderness to palpation  Patient felt much better after IV fluids and IV Zofran.  Tolerating p.o.'s.  Appropriate for discharge at this time, return precautions discussed, will Rx ODT Zofran for suspected viral gastroenteritis.    ____________________________________________   FINAL CLINICAL IMPRESSION(S) / ED DIAGNOSES  Final diagnoses:  Gastroenteritis        Note:  This document was prepared using Dragon voice recognition software and may include unintentional dictation errors.   Lavonia Drafts, MD 04/16/19 2005

## 2019-04-16 NOTE — ED Triage Notes (Signed)
Patient c/o abd pain, nausea, emesis, and body aches that started today.

## 2019-04-17 ENCOUNTER — Other Ambulatory Visit: Payer: Self-pay

## 2019-04-17 ENCOUNTER — Emergency Department
Admission: EM | Admit: 2019-04-17 | Discharge: 2019-04-17 | Disposition: A | Discharge status: Home / Self Care | Payer: Medicaid Other | Attending: Emergency Medicine | Admitting: Emergency Medicine

## 2019-04-17 ENCOUNTER — Encounter: Payer: Self-pay | Admitting: Emergency Medicine

## 2019-04-17 ENCOUNTER — Emergency Department: Payer: Medicaid Other

## 2019-04-17 DIAGNOSIS — R112 Nausea with vomiting, unspecified: Secondary | ICD-10-CM | POA: Diagnosis present

## 2019-04-17 DIAGNOSIS — R1012 Left upper quadrant pain: Secondary | ICD-10-CM | POA: Insufficient documentation

## 2019-04-17 DIAGNOSIS — E860 Dehydration: Secondary | ICD-10-CM | POA: Diagnosis not present

## 2019-04-17 DIAGNOSIS — F17210 Nicotine dependence, cigarettes, uncomplicated: Secondary | ICD-10-CM | POA: Insufficient documentation

## 2019-04-17 DIAGNOSIS — D72828 Other elevated white blood cell count: Secondary | ICD-10-CM | POA: Insufficient documentation

## 2019-04-17 DIAGNOSIS — Z20828 Contact with and (suspected) exposure to other viral communicable diseases: Secondary | ICD-10-CM | POA: Insufficient documentation

## 2019-04-17 DIAGNOSIS — B349 Viral infection, unspecified: Secondary | ICD-10-CM | POA: Diagnosis not present

## 2019-04-17 DIAGNOSIS — R1013 Epigastric pain: Secondary | ICD-10-CM | POA: Insufficient documentation

## 2019-04-17 LAB — CBC WITH DIFFERENTIAL/PLATELET
Abs Immature Granulocytes: 0.13 10*3/uL — ABNORMAL HIGH (ref 0.00–0.07)
Abs Immature Granulocytes: 0.06 10*3/uL (ref 0.00–0.07)
Basophils Absolute: 0.1 10*3/uL (ref 0.0–0.1)
Basophils Absolute: 0 10*3/uL (ref 0.0–0.1)
Basophils Relative: 0 %
Basophils Relative: 0 %
Eosinophils Absolute: 0 10*3/uL (ref 0.0–0.5)
Eosinophils Absolute: 7.7 10*3/uL — ABNORMAL HIGH (ref 0.0–0.5)
Eosinophils Relative: 0 %
Eosinophils Relative: 40 %
HCT: 53.4 % — ABNORMAL HIGH (ref 39.0–52.0)
HCT: 43.8 % (ref 39.0–52.0)
Hemoglobin: 18.1 g/dL — ABNORMAL HIGH (ref 13.0–17.0)
Hemoglobin: 14.7 g/dL (ref 13.0–17.0)
Immature Granulocytes: 1 %
Immature Granulocytes: 0 %
Lymphocytes Relative: 5 %
Lymphocytes Relative: 5 %
Lymphs Abs: 1.1 10*3/uL (ref 0.7–4.0)
Lymphs Abs: 1.1 10*3/uL (ref 0.7–4.0)
MCH: 31.2 pg (ref 26.0–34.0)
MCH: 31 pg (ref 26.0–34.0)
MCHC: 33.9 g/dL (ref 30.0–36.0)
MCHC: 33.6 g/dL (ref 30.0–36.0)
MCV: 92.1 fL (ref 80.0–100.0)
MCV: 92.4 fL (ref 80.0–100.0)
Monocytes Absolute: 0.8 10*3/uL (ref 0.1–1.0)
Monocytes Absolute: 0.9 10*3/uL (ref 0.1–1.0)
Monocytes Relative: 4 %
Monocytes Relative: 5 %
Neutro Abs: 21.3 10*3/uL — ABNORMAL HIGH (ref 1.7–7.7)
Neutro Abs: 9.7 10*3/uL — ABNORMAL HIGH (ref 1.7–7.7)
Neutrophils Relative %: 90 %
Neutrophils Relative %: 50 %
Platelets: 255 10*3/uL (ref 150–400)
Platelets: 213 10*3/uL (ref 150–400)
RBC: 5.8 MIL/uL (ref 4.22–5.81)
RBC: 4.74 MIL/uL (ref 4.22–5.81)
RDW: 13.9 % (ref 11.5–15.5)
RDW: 14.1 % (ref 11.5–15.5)
Smear Review: NORMAL
WBC: 23.3 10*3/uL — ABNORMAL HIGH (ref 4.0–10.5)
WBC: 19.6 10*3/uL — ABNORMAL HIGH (ref 4.0–10.5)
nRBC: 0 % (ref 0.0–0.2)
nRBC: 0.2 % (ref 0.0–0.2)

## 2019-04-17 LAB — COMPREHENSIVE METABOLIC PANEL
ALT: 30 U/L (ref 0–44)
AST: 39 U/L (ref 15–41)
Albumin: 6.6 g/dL — ABNORMAL HIGH (ref 3.5–5.0)
Alkaline Phosphatase: 59 U/L (ref 38–126)
Anion gap: 13 (ref 5–15)
BUN: 19 mg/dL (ref 6–20)
CO2: 24 mmol/L (ref 22–32)
Calcium: 11.5 mg/dL — ABNORMAL HIGH (ref 8.9–10.3)
Chloride: 109 mmol/L (ref 98–111)
Creatinine, Ser: 1.73 mg/dL — ABNORMAL HIGH (ref 0.61–1.24)
GFR calc Af Amer: >60 mL/min (ref >60)
GFR calc non Af Amer: 53 mL/min — ABNORMAL LOW (ref >60)
Glucose, Bld: 104 mg/dL — ABNORMAL HIGH (ref 70–99)
Potassium: 4.2 mmol/L (ref 3.5–5.1)
Sodium: 146 mmol/L — ABNORMAL HIGH (ref 135–145)
Total Bilirubin: 1.5 mg/dL — ABNORMAL HIGH (ref 0.3–1.2)
Total Protein: 10.3 g/dL — ABNORMAL HIGH (ref 6.5–8.1)

## 2019-04-17 LAB — BASIC METABOLIC PANEL
Anion gap: 6 (ref 5–15)
BUN: 18 mg/dL (ref 6–20)
CO2: 24 mmol/L (ref 22–32)
Calcium: 9.1 mg/dL (ref 8.9–10.3)
Chloride: 116 mmol/L — ABNORMAL HIGH (ref 98–111)
Creatinine, Ser: 1.49 mg/dL — ABNORMAL HIGH (ref 0.61–1.24)
GFR calc Af Amer: >60 mL/min (ref >60)
GFR calc non Af Amer: >60 mL/min (ref >60)
Glucose, Bld: 100 mg/dL — ABNORMAL HIGH (ref 70–99)
Potassium: 4.6 mmol/L (ref 3.5–5.1)
Sodium: 146 mmol/L — ABNORMAL HIGH (ref 135–145)

## 2019-04-17 LAB — URINALYSIS, COMPLETE (UACMP) WITH MICROSCOPIC
Bacteria, UA: NONE SEEN
Bilirubin Urine: NEGATIVE
Glucose, UA: NEGATIVE mg/dL
Hgb urine dipstick: NEGATIVE
Ketones, ur: 20 mg/dL — AB
Leukocytes,Ua: NEGATIVE
Nitrite: NEGATIVE
Protein, ur: 100 mg/dL — AB
Specific Gravity, Urine: 1.032 — ABNORMAL HIGH (ref 1.005–1.030)
pH: 5 (ref 5.0–8.0)

## 2019-04-17 LAB — LIPASE, BLOOD: Lipase: 29 U/L (ref 11–51)

## 2019-04-17 LAB — SARS CORONAVIRUS 2 BY RT PCR (HOSPITAL ORDER, PERFORMED IN ~~LOC~~ HOSPITAL LAB): SARS Coronavirus 2: NEGATIVE

## 2019-04-17 MED ORDER — IOHEXOL 300 MG/ML  SOLN
75.0000 mL | Freq: Once | INTRAMUSCULAR | Status: AC | PRN
  Administered 2019-04-17: 08:00:00 75 mL via INTRAVENOUS

## 2019-04-17 MED ORDER — SODIUM CHLORIDE 0.9 % IV BOLUS
1000.0000 mL | Freq: Once | INTRAVENOUS | Status: AC
  Administered 2019-04-17: 1000 mL via INTRAVENOUS

## 2019-04-17 MED ORDER — HALOPERIDOL LACTATE 5 MG/ML IJ SOLN
2.5000 mg | Freq: Once | INTRAMUSCULAR | Status: AC
  Administered 2019-04-17: 2.5 mg via INTRAVENOUS
  Filled 2019-04-17: qty 1

## 2019-04-17 MED ORDER — DIPHENHYDRAMINE HCL 50 MG/ML IJ SOLN
25.0000 mg | Freq: Once | INTRAMUSCULAR | Status: AC
  Administered 2019-04-17: 25 mg via INTRAVENOUS
  Filled 2019-04-17: qty 1

## 2019-04-17 MED ORDER — PROMETHAZINE HCL 12.5 MG PO TABS: 12.5000 mg | ORAL_TABLET | Freq: Four times a day (QID) | ORAL | 0 refills | Status: AC | PRN

## 2019-04-17 NOTE — ED Provider Notes (Signed)
[facility] Emergency Department Provider Note  ____________________________________________   First MD Initiated Contact with Patient 04/17/19 [phone]     (approximate)  I have reviewed the triage vital signs and the nursing notes.   HISTORY  Chief Complaint Generalized Body Aches    HPI Steve Cooley is a 28 y.o. male  With PMHx marijuana use here with abd pain, n/v. Pt states he smoke MJ 2 days ago. He was seen just prior to this and diagnosed with a dental infection, for which he's been on ABX. He states that since smoking, he's had severe, aching, gnawing epigastric pain with n/v. He's been unable to eat. He has also had loose stools, which he attributes to the ABX. His tooth pain is improving. His abd pain is epigastric, no radiation. He's had chills but no known fevers. No known COVID exposures. H/o similar episodes after smoking in past.        History reviewed. No pertinent past medical history.  There are no active problems to display for this patient.   History reviewed. No pertinent surgical history.  Prior to Admission medications   Medication Sig Start Date End Date Taking? Authorizing Provider  amoxicillin (AMOXIL) 500 MG capsule Take 1 capsule (500 mg total) by mouth 3 (three) times daily. 04/08/19   Menshew, Dannielle Karvonen, PA-C  HYDROcodone-acetaminophen (NORCO/VICODIN) 5-325 MG tablet Take 1 tablet by mouth every 6 (six) hours as needed for moderate pain or severe pain. 04/15/19   Johnn Hai, PA-C  ondansetron (ZOFRAN ODT) 4 MG disintegrating tablet Take 1 tablet (4 mg total) by mouth every 8 (eight) hours as needed. 04/16/19   Lavonia Drafts, MD    Allergies Patient has no known allergies.  History reviewed. No pertinent family history.  Social History Social History   Tobacco Use   Smoking status: Current Every Day Smoker    Types: Cigarettes   Smokeless tobacco: Never Used  Substance Use Topics   Alcohol use: Never     Frequency: Never   Drug use: Never    Review of Systems  Review of Systems  Constitutional: Positive for chills and fatigue. Negative for fever.  HENT: Negative for sore throat.   Respiratory: Negative for shortness of breath.   Cardiovascular: Negative for chest pain.  Gastrointestinal: Positive for abdominal pain and nausea.  Genitourinary: Negative for flank pain.  Musculoskeletal: Negative for neck pain.  Skin: Negative for rash and wound.  Allergic/Immunologic: Negative for immunocompromised state.  Neurological: Positive for weakness. Negative for numbness.  Hematological: Does not bruise/bleed easily.     ____________________________________________  PHYSICAL EXAM:      VITAL SIGNS: ED Triage Vitals  Enc Vitals Group     BP 04/17/19 0620 (!) 156/113     Pulse Rate 04/17/19 0620 89     Resp 04/17/19 0620 18     Temp 04/17/19 0620 98.4 F (36.9 C)     Temp Source 04/17/19 0620 Oral     SpO2 04/17/19 0620 100 %     Weight --      Height --      Head Circumference --      Peak Flow --      Pain Score 04/17/19 0623 0     Pain Loc --      Pain Edu? --      Excl. in [place]? --      Physical Exam Vitals signs and nursing note reviewed.  Constitutional:  General: He is not in acute distress.    Appearance: He is well-developed.  HENT:     Head: Normocephalic and atraumatic.     Mouth/Throat:     Mouth: Mucous membranes are dry.  Eyes:     Conjunctiva/sclera: Conjunctivae normal.  Neck:     Musculoskeletal: Neck supple.  Cardiovascular:     Rate and Rhythm: Normal rate and regular rhythm.     Heart sounds: Normal heart sounds. No murmur. No friction rub.  Pulmonary:     Effort: Pulmonary effort is normal. No respiratory distress.     Breath sounds: Normal breath sounds. No wheezing or rales.  Abdominal:     General: There is no distension.     Palpations: Abdomen is soft.     Tenderness: There is generalized abdominal tenderness and tenderness in  the epigastric area and left upper quadrant.  Skin:    General: Skin is warm.     Capillary Refill: Capillary refill takes less than 2 seconds.  Neurological:     Mental Status: He is alert and oriented to person, place, and time.     Motor: No abnormal muscle tone.       ____________________________________________   LABS (all labs ordered are listed, but only abnormal results are displayed)  Labs Reviewed  CBC WITH DIFFERENTIAL/PLATELET - Abnormal; Notable for the following components:      Result Value   WBC 23.3 (*)    Hemoglobin 18.1 (*)    HCT 53.4 (*)    Neutro Abs 21.3 (*)    Abs Immature Granulocytes 0.13 (*)    All other components within normal limits  SARS CORONAVIRUS 2 (HOSPITAL ORDER, PERFORMED IN [facility])  COMPREHENSIVE METABOLIC PANEL  LIPASE, BLOOD  URINALYSIS, COMPLETE (UACMP) WITH MICROSCOPIC    ____________________________________________  EKG: None ________________________________________  RADIOLOGY All imaging, including plain films, CT scans, and ultrasounds, independently reviewed by me, and interpretations confirmed via formal radiology reads.  ED MD interpretation:   CT: Pending  Official radiology report(s): No results found.  ____________________________________________  PROCEDURES   Procedure(s) performed (including Critical Care):  Procedures  ____________________________________________  INITIAL IMPRESSION / MDM / ASSESSMENT AND PLAN / ED COURSE  As part of my medical decision making, I reviewed the following data within the electronic MEDICAL RECORD NUMBER Notes from prior ED visits and [place] Controlled Substance Database      *Sherlynn CarbonDennis Peed was evaluated in Emergency Department on 04/17/2019 for the symptoms described in the history of present illness. He was evaluated in the context of the global COVID-19 pandemic, which necessitated consideration that the patient might be at risk for infection with the  SARS-CoV-2 virus that causes COVID-19. Institutional protocols and algorithms that pertain to the evaluation of patients at risk for COVID-19 are in a state of rapid change based on information released by regulatory bodies including the CDC and federal and state organizations. These policies and algorithms were followed during the patient's care in the ED.  Some ED evaluations and interventions may be delayed as a result of limited staffing during the pandemic.*   Clinical Course as of Apr 17 719  Tue Apr 17, 2019  0718 28 yo M here with what I suspect is marijuana induced hyperemesis. However, WBC increasing from 17k to 23.3 yesterday with left shift, with tachycardia in room to 120s. He has diffuse but some RLQ TTP. DDx includes marijuana hyperemesis, cyclical vomiting, gastritis, less likely cholecystitis, appendicitis. Given worsening leukocytosis, dehydration, and  differential, will check CT. Will also send rapid COVID. IVF, antiemetics started.   [CI]    Clinical Course User Index [CI] Shaune PollackIsaacs, Montina Dorrance, MD    Medical Decision Making: As above. Signed out at 7:30 AM.  ____________________________________________  FINAL CLINICAL IMPRESSION(S) / ED DIAGNOSES  Final diagnoses:  Other elevated white blood cell (WBC) count  Dehydration     MEDICATIONS GIVEN DURING THIS VISIT:  Medications  haloperidol lactate (HALDOL) injection 2.5 mg (has no administration in time range)  sodium chloride 0.9 % bolus 1,000 mL (has no administration in time range)  sodium chloride 0.9 % bolus 1,000 mL (has no administration in time range)  diphenhydrAMINE (BENADRYL) injection 25 mg (has no administration in time range)     ED Discharge Orders    None       Note:  This document was prepared using Dragon voice recognition software and may include unintentional dictation errors.   Shaune PollackIsaacs, Barbee Mamula, MD 04/17/19 [phone]

## 2019-04-17 NOTE — ED Notes (Signed)
Pt visitor left, informed she won't be allowed to come back in.

## 2019-04-17 NOTE — ED Provider Notes (Addendum)
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  He is receiving IV fluid.  He is urinating.  His abdomen is benign he is nontender he feels much better, white count was up hemoglobin was up hematocrit was up sodium was creatinine was up, all suggestive of significant intravascular depletion but not necessarily of any particular disease processes in his kidneys etc.  We are giving him copious IV fluids and after Haldol he is no longer nauseated.  CT is negative we will continue to assess     ----------------------------------------- 11:50 AM on 04/17/2019 -----------------------------------------  CT reassuring abdomen benign, white count trending down with some fluids, waiting for BMP.  Patient not vomiting, tolerating p.o.  His preference is to go home.  We will see with his kidney functions improving  ----------------------------------------- 1:41 PM on 04/17/2019 -----------------------------------------  Blood work all shows improvement with hydration, patient is eating and drinking as he has not vomited his abdomen is benign he has had no diarrhea here he is eager to go home CT is reassuring, offered last advised admission but he declined.  We will send him home with nausea medication return precautions.  Also advised him not smoke marijuana, return precautions were given understood.    Schuyler Amor, MD 04/17/19 0086    Schuyler Amor, MD 04/17/19 1151    Schuyler Amor, MD 04/17/19 [phone]

## 2019-04-17 NOTE — ED Notes (Signed)
Sent another light green to lab at this time.

## 2019-04-17 NOTE — ED Notes (Signed)
Pt provided ginger ale for PO challenge  

## 2019-04-17 NOTE — ED Triage Notes (Signed)
Pt to triage and requested IV fluids. Pt sts he was seen in ED on 8/17, given fluids but needs more. Pt has not filled prescriptions. Pt antsy in triage. Pt not able to stand still. Pt taken to RM 7 for further evaluation.

## 2019-04-17 NOTE — Discharge Instructions (Signed)
We do advise you do not smoke marijuana as this can cause you to have vomiting, return to the emergency room for any new or worrisome symptoms.

## 2019-04-18 ENCOUNTER — Encounter: Payer: Self-pay | Admitting: Emergency Medicine

## 2019-04-18 ENCOUNTER — Other Ambulatory Visit: Payer: Self-pay

## 2019-04-18 ENCOUNTER — Emergency Department
Admission: EM | Admit: 2019-04-18 | Discharge: 2019-04-18 | Disposition: A | Discharge status: Home / Self Care | Payer: Medicaid Other | Attending: Internal Medicine | Admitting: Internal Medicine

## 2019-04-18 DIAGNOSIS — M6282 Rhabdomyolysis: Secondary | ICD-10-CM | POA: Insufficient documentation

## 2019-04-18 DIAGNOSIS — F17210 Nicotine dependence, cigarettes, uncomplicated: Secondary | ICD-10-CM | POA: Diagnosis not present

## 2019-04-18 DIAGNOSIS — F12988 Cannabis use, unspecified with other cannabis-induced disorder: Secondary | ICD-10-CM | POA: Diagnosis not present

## 2019-04-18 DIAGNOSIS — Z79899 Other long term (current) drug therapy: Secondary | ICD-10-CM | POA: Insufficient documentation

## 2019-04-18 DIAGNOSIS — R531 Weakness: Secondary | ICD-10-CM | POA: Diagnosis present

## 2019-04-18 DIAGNOSIS — R112 Nausea with vomiting, unspecified: Secondary | ICD-10-CM

## 2019-04-18 LAB — URINALYSIS, COMPLETE (UACMP) WITH MICROSCOPIC
Bacteria, UA: NONE SEEN
Bilirubin Urine: NEGATIVE
Glucose, UA: NEGATIVE mg/dL
Hgb urine dipstick: NEGATIVE
Ketones, ur: NEGATIVE mg/dL
Leukocytes,Ua: NEGATIVE
Nitrite: NEGATIVE
Protein, ur: 100 mg/dL — AB
Specific Gravity, Urine: 1.036 — ABNORMAL HIGH (ref 1.005–1.030)
Squamous Epithelial / HPF: NONE SEEN (ref 0–5)
pH: 5 (ref 5.0–8.0)

## 2019-04-18 LAB — CBC WITH DIFFERENTIAL/PLATELET
Abs Immature Granulocytes: 0.04 10*3/uL (ref 0.00–0.07)
Basophils Absolute: 0 10*3/uL (ref 0.0–0.1)
Basophils Relative: 0 %
Eosinophils Absolute: 0 10*3/uL (ref 0.0–0.5)
Eosinophils Relative: 0 %
HCT: 46.3 % (ref 39.0–52.0)
Hemoglobin: 15.6 g/dL (ref 13.0–17.0)
Immature Granulocytes: 0 %
Lymphocytes Relative: 10 %
Lymphs Abs: 1.1 10*3/uL (ref 0.7–4.0)
MCH: 31 pg (ref 26.0–34.0)
MCHC: 33.7 g/dL (ref 30.0–36.0)
MCV: 91.9 fL (ref 80.0–100.0)
Monocytes Absolute: 0.4 10*3/uL (ref 0.1–1.0)
Monocytes Relative: 3 %
Neutro Abs: 10 10*3/uL — ABNORMAL HIGH (ref 1.7–7.7)
Neutrophils Relative %: 87 %
Platelets: 239 10*3/uL (ref 150–400)
RBC: 5.04 MIL/uL (ref 4.22–5.81)
RDW: 13.9 % (ref 11.5–15.5)
WBC: 11.6 10*3/uL — ABNORMAL HIGH (ref 4.0–10.5)
nRBC: 0 % (ref 0.0–0.2)

## 2019-04-18 LAB — COMPREHENSIVE METABOLIC PANEL
ALT: 34 U/L (ref 0–44)
AST: 52 U/L — ABNORMAL HIGH (ref 15–41)
Albumin: 5.2 g/dL — ABNORMAL HIGH (ref 3.5–5.0)
Alkaline Phosphatase: 40 U/L (ref 38–126)
Anion gap: 11 (ref 5–15)
BUN: 16 mg/dL (ref 6–20)
CO2: 22 mmol/L (ref 22–32)
Calcium: 9.8 mg/dL (ref 8.9–10.3)
Chloride: 106 mmol/L (ref 98–111)
Creatinine, Ser: 1.08 mg/dL (ref 0.61–1.24)
GFR calc Af Amer: >60 mL/min (ref >60)
GFR calc non Af Amer: >60 mL/min (ref >60)
Glucose, Bld: 107 mg/dL — ABNORMAL HIGH (ref 70–99)
Potassium: 3.9 mmol/L (ref 3.5–5.1)
Sodium: 139 mmol/L (ref 135–145)
Total Bilirubin: 2 mg/dL — ABNORMAL HIGH (ref 0.3–1.2)
Total Protein: 7.9 g/dL (ref 6.5–8.1)

## 2019-04-18 LAB — CK: Total CK: 1482 U/L — ABNORMAL HIGH (ref 49–397)

## 2019-04-18 MED ORDER — ONDANSETRON HCL 4 MG/2ML IJ SOLN
4.0000 mg | Freq: Once | INTRAMUSCULAR | Status: AC
  Administered 2019-04-18: 4 mg via INTRAVENOUS

## 2019-04-18 MED ORDER — HALOPERIDOL LACTATE 5 MG/ML IJ SOLN
2.0000 mg | Freq: Once | INTRAMUSCULAR | Status: AC
  Administered 2019-04-18: 2 mg via INTRAVENOUS
  Filled 2019-04-18: qty 1

## 2019-04-18 MED ORDER — SODIUM CHLORIDE 0.9 % IV BOLUS
1000.0000 mL | Freq: Once | INTRAVENOUS | Status: AC
  Administered 2019-04-18: 1000 mL via INTRAVENOUS

## 2019-04-18 MED ORDER — ACETAMINOPHEN 500 MG PO TABS
1000.0000 mg | ORAL_TABLET | Freq: Once | ORAL | Status: AC
  Administered 2019-04-18: 1000 mg via ORAL
  Filled 2019-04-18: qty 2

## 2019-04-18 NOTE — ED Notes (Signed)
Pt unable to complete e-signature for discharge due to computer not working in room at this time. Pt verbalized understanding of d/c instructions and followup

## 2019-04-18 NOTE — ED Notes (Signed)
See triage note  States he has been weak for couple of days  Was seen for hyperemesis yesterday  States he conts to be weak

## 2019-04-18 NOTE — Discharge Instructions (Addendum)
Avoid marijuana use. This is making you vomit.  If you feel nauseated after you get home, use the Phenergan suppositories that you were prescribed earlier.  Follow up with the primary care provider of your choice for symptoms of concern.

## 2019-04-18 NOTE — ED Notes (Addendum)
Pt continues to call out repeatedly.  Pt tolerating PO fluids at this time with no nausea at this time. Pt instructed to leave arm straight. When RN left room pt was up moving around in room and sat back down on IV tubing ripping IV out of his arm. Gauze place on arm.  Pt received around 200 mL of second bolus prior to IV removal.

## 2019-04-18 NOTE — ED Triage Notes (Addendum)
Pt states he was seen and d/c this am. Pt was seen for vomiting and feeling dehydrated. PT states he is still very weak today and "want to make sure I am good to go back to work because I don't feel like I am right now." Pt denies new complaints. PT states "it is my fault I went home and smoked some more marijuana and it made me dehydrated again."

## 2019-04-18 NOTE — ED Provider Notes (Signed)
[facility] Emergency Department Provider Note ____________________________________________   None    (approximate)  I have reviewed the triage vital signs and the nursing notes.   HISTORY  Chief Complaint Weakness  HPI Steve Cooley is a 28 y.o. male who presents to the emergency department for treatment and evaluation of persistent vomiting.  He states that he was here yesterday and treated for hyperemesis from marijuana.  He states that he felt much better upon discharge and decided to go home and smokes more marijuana.  He states that this was a bad idea and he began vomiting again.  He states that he feels dehydrated and is unable to keep any type of food or fluids down.         History reviewed. No pertinent past medical history.  There are no active problems to display for this patient.   History reviewed. No pertinent surgical history.  Prior to Admission medications   Medication Sig Start Date End Date Taking? Authorizing Provider  amoxicillin (AMOXIL) 500 MG capsule Take 1 capsule (500 mg total) by mouth 3 (three) times daily. 04/08/19   Menshew, Steve IvoryJenise V Bacon, PA-C  HYDROcodone-acetaminophen (NORCO/VICODIN) 5-325 MG tablet Take 1 tablet by mouth every 6 (six) hours as needed for moderate pain or severe pain. 04/15/19   Tommi RumpsSummers, Rhonda L, PA-C  ondansetron (ZOFRAN ODT) 4 MG disintegrating tablet Take 1 tablet (4 mg total) by mouth every 8 (eight) hours as needed. 04/16/19   Jene EveryKinner, Robert, MD  promethazine (PHENERGAN) 12.5 MG tablet Take 1 tablet (12.5 mg total) by mouth every 6 (six) hours as needed for nausea or vomiting. 04/17/19   Jeanmarie PlantMcShane, James A, MD    Allergies Patient has no known allergies.  No family history on file.  Social History Social History   Tobacco Use   Smoking status: Current Every Day Smoker    Types: Cigarettes   Smokeless tobacco: Never Used  Substance Use Topics   Alcohol use: Never    Frequency: Never    Drug use: Never    Review of Systems  Constitutional: No fever/chills Eyes: No visual changes. ENT: No sore throat. Cardiovascular: Denies chest pain. Respiratory: Denies shortness of breath. Gastrointestinal: No abdominal pain.  No nausea, no vomiting.  No diarrhea.  No constipation. Genitourinary: Negative for dysuria. Musculoskeletal: Negative for back pain. Skin: Negative for rash. Neurological: Negative for headaches, focal weakness or numbness. ____________________________________________   PHYSICAL EXAM:  VITAL SIGNS: ED Triage Vitals  Enc Vitals Group     BP 04/18/19 1306 (!) 147/96     Pulse Rate 04/18/19 1306 86     Resp 04/18/19 1306 18     Temp 04/18/19 1306 98.8 F (37.1 C)     Temp Source 04/18/19 1306 Oral     SpO2 04/18/19 1306 100 %     Weight 04/18/19 1307 125 lb (56.7 kg)     Height 04/18/19 1307 5\' 9"  (1.753 m)     Head Circumference --      Peak Flow --      Pain Score 04/18/19 1306 7     Pain Loc --      Pain Edu? --      Excl. in GC? --     Constitutional: Alert and oriented. Well appearing and in no acute distress. Eyes: Conjunctivae are normal. PERRL. EOMI. Head: Atraumatic. Nose: No congestion/rhinnorhea. Mouth/Throat: Mucous membranes are moist.  Oropharynx non-erythematous. Neck: No stridor.   Hematological/Lymphatic/Immunilogical: No cervical lymphadenopathy. Cardiovascular:  Normal rate, regular rhythm. Grossly normal heart sounds.  Good peripheral circulation. Respiratory: Normal respiratory effort.  No retractions. Lungs CTAB. Gastrointestinal: Soft and nontender. No distention. No abdominal bruits. No CVA tenderness. Genitourinary:  Musculoskeletal: No lower extremity tenderness nor edema.  No joint effusions. Neurologic:  Normal speech and language. No gross focal neurologic deficits are appreciated. No gait instability. Skin:  Skin is warm, dry and intact. No rash noted. Plantar's Warts present on soles of bilateral  feet. Psychiatric: Mood and affect are normal. Speech and behavior are normal.  ____________________________________________   LABS (all labs ordered are listed, but only abnormal results are displayed)  Labs Reviewed  CBC WITH DIFFERENTIAL/PLATELET - Abnormal; Notable for the following components:      Result Value   WBC 11.6 (*)    Neutro Abs 10.0 (*)    All other components within normal limits  COMPREHENSIVE METABOLIC PANEL - Abnormal; Notable for the following components:   Glucose, Bld 107 (*)    Albumin 5.2 (*)    AST 52 (*)    Total Bilirubin 2.0 (*)    All other components within normal limits  URINALYSIS, COMPLETE (UACMP) WITH MICROSCOPIC - Abnormal; Notable for the following components:   Color, Urine AMBER (*)    APPearance HAZY (*)    Specific Gravity, Urine 1.036 (*)    Protein, ur 100 (*)    All other components within normal limits  CK - Abnormal; Notable for the following components:   Total CK 1,482 (*)    All other components within normal limits   ____________________________________________  EKG  Not indicated ____________________________________________  RADIOLOGY  ED MD interpretation:    Not indicated.  Official radiology report(s): No results found.  ____________________________________________   PROCEDURES  Procedure(s) performed (including Critical Care):  Procedures  ____________________________________________   INITIAL IMPRESSION / ASSESSMENT AND PLAN     28 year old male presenting to the emergency department for hyperemesis secondary to marijuana.  DIFFERENTIAL DIAGNOSIS  Hyper emesis secondary to marijuana, rhabdomyolysis, gastroenteritis  ED COURSE  28 year old male presenting to the emergency department for persistent vomiting.  He was evaluated here earlier yesterday for the same.  He was given a prescription for Phenergan.  He states he has been unable to keep the Phenergan down long enough for it to help with  the vomiting.  After discharge yesterday, he decided to smokes more marijuana and the vomiting resumed.  While here, he received Zofran and IV fluids.  He continued to feel nauseated after the Zofran.  Chart review from yesterday's visit showed that Haldol controlled the nausea and vomiting very well.  2 mg of Haldol was ordered.  No further vomiting.  Labs compared to yesterday's visit are reassuring.  His CK is elevated at 1482, however his CK was not collected yesterday.  Second liter fluid has been ordered.  Patient observed sticking his fingers down his throat in order to induce vomiting.  When asked why the patient was doing this he stated that he felt nauseated and it would not come out so he made himself vomit.  He was advised not to do this.  Patient is very anxious to leave or "be admitted and given medicine to help him sleep" and has been in and out of bed many times.  He has rung out on his call bell several times to request a work note.  He eventually pulled his IV out despite only having received about 200 mL out of the second bag of fluids.  He had been able to tolerate peanut butter, crackers, and ginger ale without vomiting.  He was encouraged to drink as much water as he possibly could over the next few days.  He will be given a work excuse for the next couple of days as well.  He was strongly advised not to smoke marijuana.  And finally, he was advised to return to the emergency department if he feels like he is getting worse. ____________________________________________   FINAL CLINICAL IMPRESSION(S) / ED DIAGNOSES  Final diagnoses:  Non-traumatic rhabdomyolysis  Cannabinoid hyperemesis syndrome [facility](HCC)     ED Discharge Orders    None       Note:  This document was prepared using Dragon voice recognition software and may include unintentional dictation errors.   Chinita Pesterriplett, Shereen Marton B, FNP 04/18/19 1636    Phineas SemenGoodman, Graydon, MD 04/19/19 [phone]

## 2019-04-18 NOTE — ED Notes (Signed)
Per MD Jimmye Norman pt can be seen in [facility]

## 2019-04-20 ENCOUNTER — Inpatient Hospital Stay
Admission: EM | Admit: 2019-04-20 | Discharge: 2019-04-22 | DRG: 558 | Disposition: A | Discharge status: Home / Self Care | Payer: Medicaid Other | Attending: Internal Medicine | Admitting: Internal Medicine

## 2019-04-20 ENCOUNTER — Other Ambulatory Visit: Payer: Self-pay

## 2019-04-20 ENCOUNTER — Emergency Department: Payer: Medicaid Other

## 2019-04-20 DIAGNOSIS — E876 Hypokalemia: Secondary | ICD-10-CM | POA: Diagnosis present

## 2019-04-20 DIAGNOSIS — N179 Acute kidney failure, unspecified: Secondary | ICD-10-CM | POA: Diagnosis present

## 2019-04-20 DIAGNOSIS — Z20828 Contact with and (suspected) exposure to other viral communicable diseases: Secondary | ICD-10-CM | POA: Diagnosis present

## 2019-04-20 DIAGNOSIS — Z87820 Personal history of traumatic brain injury: Secondary | ICD-10-CM

## 2019-04-20 DIAGNOSIS — F17210 Nicotine dependence, cigarettes, uncomplicated: Secondary | ICD-10-CM | POA: Diagnosis present

## 2019-04-20 DIAGNOSIS — E860 Dehydration: Secondary | ICD-10-CM | POA: Diagnosis present

## 2019-04-20 DIAGNOSIS — G4700 Insomnia, unspecified: Secondary | ICD-10-CM | POA: Diagnosis present

## 2019-04-20 DIAGNOSIS — F32A Depression, unspecified: Secondary | ICD-10-CM | POA: Diagnosis not present

## 2019-04-20 DIAGNOSIS — F329 Major depressive disorder, single episode, unspecified: Secondary | ICD-10-CM | POA: Diagnosis present

## 2019-04-20 DIAGNOSIS — S069XAA Unspecified intracranial injury with loss of consciousness status unknown, initial encounter: Secondary | ICD-10-CM | POA: Diagnosis present

## 2019-04-20 DIAGNOSIS — M6282 Rhabdomyolysis: Principal | ICD-10-CM | POA: Diagnosis present

## 2019-04-20 DIAGNOSIS — R636 Underweight: Secondary | ICD-10-CM | POA: Diagnosis present

## 2019-04-20 DIAGNOSIS — Z681 Body mass index (BMI) 19 or less, adult: Secondary | ICD-10-CM | POA: Diagnosis not present

## 2019-04-20 DIAGNOSIS — R531 Weakness: Secondary | ICD-10-CM | POA: Diagnosis not present

## 2019-04-20 DIAGNOSIS — S069X9A Unspecified intracranial injury with loss of consciousness of unspecified duration, initial encounter: Secondary | ICD-10-CM | POA: Diagnosis present

## 2019-04-20 DIAGNOSIS — E871 Hypo-osmolality and hyponatremia: Secondary | ICD-10-CM | POA: Diagnosis present

## 2019-04-20 DIAGNOSIS — F3289 Other specified depressive episodes: Secondary | ICD-10-CM | POA: Diagnosis not present

## 2019-04-20 HISTORY — DX: Unspecified intracranial injury with loss of consciousness status unknown, initial encounter: S06.9XAA

## 2019-04-20 HISTORY — DX: Unspecified intracranial injury with loss of consciousness of unspecified duration, initial encounter: S06.9X9A

## 2019-04-20 LAB — URINALYSIS, COMPLETE (UACMP) WITH MICROSCOPIC
Bacteria, UA: NONE SEEN
Glucose, UA: NEGATIVE mg/dL
Hgb urine dipstick: NEGATIVE
Ketones, ur: 5 mg/dL — AB
Leukocytes,Ua: NEGATIVE
Nitrite: NEGATIVE
Protein, ur: 100 mg/dL — AB
Specific Gravity, Urine: 1.032 — ABNORMAL HIGH (ref 1.005–1.030)
pH: 5 (ref 5.0–8.0)

## 2019-04-20 LAB — CBC
HCT: 51.7 % (ref 39.0–52.0)
Hemoglobin: 18.3 g/dL — ABNORMAL HIGH (ref 13.0–17.0)
MCH: 31.3 pg (ref 26.0–34.0)
MCHC: 35.4 g/dL (ref 30.0–36.0)
MCV: 88.4 fL (ref 80.0–100.0)
Platelets: 260 10*3/uL (ref 150–400)
RBC: 5.85 MIL/uL — ABNORMAL HIGH (ref 4.22–5.81)
RDW: 12.8 % (ref 11.5–15.5)
WBC: 13 10*3/uL — ABNORMAL HIGH (ref 4.0–10.5)
nRBC: 0 % (ref 0.0–0.2)

## 2019-04-20 LAB — HEPATIC FUNCTION PANEL
ALT: 36 U/L (ref 0–44)
AST: 50 U/L — ABNORMAL HIGH (ref 15–41)
Albumin: 6 g/dL — ABNORMAL HIGH (ref 3.5–5.0)
Alkaline Phosphatase: 48 U/L (ref 38–126)
Bilirubin, Direct: 0.4 mg/dL — ABNORMAL HIGH (ref 0.0–0.2)
Indirect Bilirubin: 1.8 mg/dL — ABNORMAL HIGH (ref 0.3–0.9)
Total Bilirubin: 2.2 mg/dL — ABNORMAL HIGH (ref 0.3–1.2)
Total Protein: 9.6 g/dL — ABNORMAL HIGH (ref 6.5–8.1)

## 2019-04-20 LAB — TROPONIN I (HIGH SENSITIVITY)
Troponin I (High Sensitivity): 20 ng/L — ABNORMAL HIGH (ref <18)
Troponin I (High Sensitivity): 17 ng/L (ref <18)

## 2019-04-20 LAB — URINE DRUG SCREEN, QUALITATIVE (ARMC ONLY)
Amphetamines, Ur Screen: NOT DETECTED
Barbiturates, Ur Screen: NOT DETECTED
Benzodiazepine, Ur Scrn: NOT DETECTED
Cannabinoid 50 Ng, Ur ~~LOC~~: POSITIVE — AB
Cocaine Metabolite,Ur ~~LOC~~: NOT DETECTED
MDMA (Ecstasy)Ur Screen: NOT DETECTED
Methadone Scn, Ur: NOT DETECTED
Opiate, Ur Screen: POSITIVE — AB
Phencyclidine (PCP) Ur S: NOT DETECTED
Tricyclic, Ur Screen: POSITIVE — AB

## 2019-04-20 LAB — BASIC METABOLIC PANEL
Anion gap: 14 (ref 5–15)
BUN: 31 mg/dL — ABNORMAL HIGH (ref 6–20)
CO2: 24 mmol/L (ref 22–32)
Calcium: 10.5 mg/dL — ABNORMAL HIGH (ref 8.9–10.3)
Chloride: 93 mmol/L — ABNORMAL LOW (ref 98–111)
Creatinine, Ser: 2.58 mg/dL — ABNORMAL HIGH (ref 0.61–1.24)
GFR calc Af Amer: 38 mL/min — ABNORMAL LOW (ref >60)
GFR calc non Af Amer: 33 mL/min — ABNORMAL LOW (ref >60)
Glucose, Bld: 122 mg/dL — ABNORMAL HIGH (ref 70–99)
Potassium: 2.9 mmol/L — ABNORMAL LOW (ref 3.5–5.1)
Sodium: 131 mmol/L — ABNORMAL LOW (ref 135–145)

## 2019-04-20 LAB — PHOSPHORUS: Phosphorus: 5.2 mg/dL — ABNORMAL HIGH (ref 2.5–4.6)

## 2019-04-20 LAB — MAGNESIUM: Magnesium: 2.9 mg/dL — ABNORMAL HIGH (ref 1.7–2.4)

## 2019-04-20 LAB — SARS CORONAVIRUS 2 BY RT PCR (HOSPITAL ORDER, PERFORMED IN ~~LOC~~ HOSPITAL LAB): SARS Coronavirus 2: NEGATIVE

## 2019-04-20 LAB — TSH: TSH: 0.745 u[IU]/mL (ref 0.350–4.500)

## 2019-04-20 LAB — CK: Total CK: 1425 U/L — ABNORMAL HIGH (ref 49–397)

## 2019-04-20 MED ORDER — POTASSIUM CHLORIDE 10 MEQ/100ML IV SOLN
10.0000 meq | INTRAVENOUS | Status: AC
  Administered 2019-04-20: 10 meq via INTRAVENOUS
  Filled 2019-04-20 (×4): qty 100

## 2019-04-20 MED ORDER — LORAZEPAM 2 MG/ML IJ SOLN
0.5000 mg | Freq: Once | INTRAMUSCULAR | Status: AC
  Administered 2019-04-20: 0.5 mg via INTRAVENOUS
  Filled 2019-04-20: qty 1

## 2019-04-20 MED ORDER — TRAZODONE HCL 50 MG PO TABS
50.0000 mg | ORAL_TABLET | Freq: Every day | ORAL | Status: DC
  Administered 2019-04-20 – 2019-04-21 (×2): 50 mg via ORAL
  Filled 2019-04-20 (×2): qty 1

## 2019-04-20 MED ORDER — ADULT MULTIVITAMIN W/MINERALS CH
1.0000 | ORAL_TABLET | Freq: Every day | ORAL | Status: DC
  Administered 2019-04-20 – 2019-04-22 (×3): 1 via ORAL
  Filled 2019-04-20 (×3): qty 1

## 2019-04-20 MED ORDER — POTASSIUM CHLORIDE 10 MEQ/100ML IV SOLN
10.0000 meq | INTRAVENOUS | Status: DC
  Administered 2019-04-20 – 2019-04-21 (×3): 10 meq via INTRAVENOUS
  Filled 2019-04-20 (×4): qty 100

## 2019-04-20 MED ORDER — HYDROCODONE-ACETAMINOPHEN 5-325 MG PO TABS
1.0000 | ORAL_TABLET | Freq: Four times a day (QID) | ORAL | Status: DC | PRN
  Administered 2019-04-20: 1 via ORAL
  Filled 2019-04-20: qty 1

## 2019-04-20 MED ORDER — SODIUM CHLORIDE 0.9 % IV SOLN
INTRAVENOUS | Status: DC
  Administered 2019-04-20 – 2019-04-22 (×4): via INTRAVENOUS

## 2019-04-20 MED ORDER — PROMETHAZINE HCL 25 MG PO TABS
12.5000 mg | ORAL_TABLET | Freq: Four times a day (QID) | ORAL | Status: DC | PRN
  Administered 2019-04-20: 12.5 mg via ORAL
  Filled 2019-04-20 (×2): qty 1

## 2019-04-20 MED ORDER — HEPARIN SODIUM (PORCINE) 5000 UNIT/ML IJ SOLN
5000.0000 [IU] | Freq: Three times a day (TID) | INTRAMUSCULAR | Status: DC
  Administered 2019-04-20 – 2019-04-22 (×6): 5000 [IU] via SUBCUTANEOUS
  Filled 2019-04-20 (×5): qty 1

## 2019-04-20 MED ORDER — SODIUM CHLORIDE 0.9 % IV SOLN
INTRAVENOUS | Status: DC | PRN
  Administered 2019-04-20: 250 mL via INTRAVENOUS

## 2019-04-20 MED ORDER — ENSURE ENLIVE PO LIQD
237.0000 mL | Freq: Three times a day (TID) | ORAL | Status: DC
  Administered 2019-04-20 – 2019-04-21 (×5): 237 mL via ORAL

## 2019-04-20 MED ORDER — VITAMIN B-1 100 MG PO TABS
100.0000 mg | ORAL_TABLET | Freq: Every day | ORAL | Status: DC
  Administered 2019-04-20 – 2019-04-22 (×3): 100 mg via ORAL
  Filled 2019-04-20 (×3): qty 1

## 2019-04-20 MED ORDER — FOLIC ACID 1 MG PO TABS
1.0000 mg | ORAL_TABLET | Freq: Every day | ORAL | Status: DC
  Administered 2019-04-20 – 2019-04-22 (×3): 1 mg via ORAL
  Filled 2019-04-20 (×3): qty 1

## 2019-04-20 MED ORDER — SODIUM CHLORIDE 0.9% FLUSH
3.0000 mL | Freq: Once | INTRAVENOUS | Status: AC
  Administered 2019-04-20: 3 mL via INTRAVENOUS

## 2019-04-20 MED ORDER — LACTATED RINGERS IV BOLUS
1000.0000 mL | Freq: Once | INTRAVENOUS | Status: AC
  Administered 2019-04-20: 1000 mL via INTRAVENOUS

## 2019-04-20 NOTE — ED Notes (Signed)
Pt c/o generalized weakness and fatigue for 4 days. Denies any pain. Denies NVD. Denies fever. Pt placed on the cardiac monitor.

## 2019-04-20 NOTE — ED Notes (Signed)
ED TO INPATIENT HANDOFF REPORT  ED Nurse Name and Phone #:  Maudie Mercury [phone]  S Name/Age/Gender Steve Cooley 28 y.o. male Room/Bed: ED14A/ED14A  Code Status   Code Status: Full Code  Home/SNF/Other Home Patient oriented to: self, place, time and situation Is this baseline? Yes   Triage Complete: Triage complete  Chief Complaint Generalized Weakness   Triage Note Pt comes via POV from home with c/o weakness, SOB and fatigue. Pt states he has this going on since yesterday. Pt states he has been seen here in ED for last 4 days each for something different.  Pt also states night sweats.   Allergies No Known Allergies  Level of Care/Admitting Diagnosis ED Disposition    ED Disposition Condition [facility]: [place] [[id number]]  Level of Care: Telemetry [5]  Covid Evaluation: Confirmed COVID Negative  Diagnosis: Rhabdomyolysis [728.88.ICD-9-CM]  Admitting Physician: Rufina Falco [place]  Attending Physician: Rufina Falco ACHIENG [[id number]]  Estimated length of stay: past midnight tomorrow  Certification:: I certify this patient will need inpatient services for at least 2 midnights  PT Class (Do Not Modify): Inpatient [101]  PT Acc Code (Do Not Modify): Private [1]       B Medical/Surgery History Past Medical History:  Diagnosis Date  . TBI (traumatic brain injury) [facility])    History reviewed. No pertinent surgical history.   A IV Location/Drains/Wounds Patient Lines/Drains/Airways Status   Active Line/Drains/Airways    Name:   Placement date:   Placement time:   Site:   Days:   Peripheral IV 04/20/19 Left;Posterior Hand   04/20/19    1202    Hand   less than 1          Intake/Output Last 24 hours No intake or output data in the 24 hours ending 04/20/19 1401  Labs/Imaging Results for orders placed or performed during the hospital encounter of 04/20/19 (from the past 48 hour(s))  Basic metabolic panel      Status: Abnormal   Collection Time: 04/20/19  9:06 AM  Result Value Ref Range   Sodium 131 (L) 135 - 145 mmol/L    Comment: RESULTS VERIFIED BY REPEAT TESTING JJB   Potassium 2.9 (L) 3.5 - 5.1 mmol/L   Chloride 93 (L) 98 - 111 mmol/L   CO2 24 22 - 32 mmol/L   Glucose, Bld 122 (H) 70 - 99 mg/dL   BUN 31 (H) 6 - 20 mg/dL   Creatinine, Ser 2.58 (H) 0.61 - 1.24 mg/dL    Comment: RESULTS VERIFIED BY REPEAT TESTING JJB   Calcium 10.5 (H) 8.9 - 10.3 mg/dL   GFR calc non Af Amer 33 (L) >60 mL/min   GFR calc Af Amer 38 (L) >60 mL/min   Anion gap 14 5 - 15    Comment: Performed at [facility], [address] [place], [place] [address]  CBC     Status: Abnormal   Collection Time: 04/20/19  9:06 AM  Result Value Ref Range   WBC 13.0 (H) 4.0 - 10.5 K/uL   RBC 5.85 (H) 4.22 - 5.81 MIL/uL   Hemoglobin 18.3 (H) 13.0 - 17.0 g/dL   HCT 51.7 39.0 - 52.0 %   MCV 88.4 80.0 - 100.0 fL   MCH 31.3 26.0 - 34.0 pg   MCHC 35.4 30.0 - 36.0 g/dL   RDW 12.8 11.5 - 15.5 %   Platelets 260 150 - 400 K/uL   nRBC 0.0 0.0 -  0.2 %    Comment: Performed at [facility], [address] [place], [place] [address]  Troponin I (High Sensitivity)     Status: Abnormal   Collection Time: 04/20/19  9:06 AM  Result Value Ref Range   Troponin I (High Sensitivity) 20 (H) <18 ng/L    Comment: (NOTE) Elevated high sensitivity troponin I (hsTnI) values and significant  changes across serial measurements may suggest ACS but many other  chronic and acute conditions are known to elevate hsTnI results.  Refer to the "Links" section for chest pain algorithms and additional  guidance. Performed at [facility], [address] [place], [place] [address]   CK     Status: Abnormal   Collection Time: 04/20/19  9:06 AM  Result Value Ref Range   Total CK 1,425 (H) 49 - 397 U/L    Comment: Performed at [facility], [address] [place], [place] [address]  Troponin I (High Sensitivity)      Status: None   Collection Time: 04/20/19 10:36 AM  Result Value Ref Range   Troponin I (High Sensitivity) 17 <18 ng/L    Comment: (NOTE) Elevated high sensitivity troponin I (hsTnI) values and significant  changes across serial measurements may suggest ACS but many other  chronic and acute conditions are known to elevate hsTnI results.  Refer to the "Links" section for chest pain algorithms and additional  guidance. Performed at [facility], [address] [place], [place] [address]   SARS Coronavirus 2 [facility](Hospital order, Performed in [facility] hospital lab) Nasopharyngeal Nasopharyngeal Swab     Status: None   Collection Time: 04/20/19 10:46 AM   Specimen: Nasopharyngeal Swab  Result Value Ref Range   SARS Coronavirus 2 NEGATIVE NEGATIVE    Comment: (NOTE) If result is NEGATIVE SARS-CoV-2 target nucleic acids are NOT DETECTED. The SARS-CoV-2 RNA is generally detectable in upper and lower  respiratory specimens during the acute phase of infection. The lowest  concentration of SARS-CoV-2 viral copies this assay can detect is 250  copies / mL. A negative result does not preclude SARS-CoV-2 infection  and should not be used as the sole basis for treatment or other  patient management decisions.  A negative result may occur with  improper specimen collection / handling, submission of specimen other  than nasopharyngeal swab, presence of viral mutation(s) within the  areas targeted by this assay, and inadequate number of viral copies  (<250 copies / mL). A negative result must be combined with clinical  observations, patient history, and epidemiological information. If result is POSITIVE SARS-CoV-2 target nucleic acids are DETECTED. The SARS-CoV-2 RNA is generally detectable in upper and lower  respiratory specimens dur ing the acute phase of infection.  Positive  results are indicative of active infection with SARS-CoV-2.  Clinical  correlation with patient history  and other diagnostic information is  necessary to determine patient infection status.  Positive results do  not rule out bacterial infection or co-infection with other viruses. If result is PRESUMPTIVE POSTIVE SARS-CoV-2 nucleic acids MAY BE PRESENT.   A presumptive positive result was obtained on the submitted specimen  and confirmed on repeat testing.  While 2019 novel coronavirus  (SARS-CoV-2) nucleic acids may be present in the submitted sample  additional confirmatory testing may be necessary for epidemiological  and / or clinical management purposes  to differentiate between  SARS-CoV-2 and other Sarbecovirus currently known to infect humans.  If clinically indicated additional testing with an  alternate test  methodology [id number]) is advised. The SARS-CoV-2 RNA is generally  detectable in upper and lower respiratory sp ecimens during the acute  phase of infection. The expected result is Negative. Fact Sheet for Patients:  BoilerBrush.com.cyhttps://www.fda.gov/media/136312/download Fact Sheet for Healthcare Providers: https://pope.com/https://www.fda.gov/media/136313/download This test is not yet approved or cleared by the [place] FDA and has been authorized for detection and/or diagnosis of SARS-CoV-2 by FDA under an Emergency Use Authorization (EUA).  This EUA will remain in effect (meaning this test can be used) for the duration of the COVID-19 declaration under Section 564(b)(1) of the Act, 21 U.S.C. section 360bbb-3(b)(1), unless the authorization is terminated or revoked sooner. Performed at [facility], [address] [place], [place] [address]   Urinalysis, Complete w Microscopic     Status: Abnormal   Collection Time: 04/20/19 11:35 AM  Result Value Ref Range   Color, Urine AMBER (A) YELLOW    Comment: BIOCHEMICALS MAY BE AFFECTED BY COLOR   APPearance CLOUDY (A) CLEAR   Specific Gravity, Urine 1.032 (H) 1.005 - 1.030   pH 5.0 5.0 - 8.0   Glucose, UA NEGATIVE NEGATIVE mg/dL   Hgb  urine dipstick NEGATIVE NEGATIVE   Bilirubin Urine SMALL (A) NEGATIVE   Ketones, ur 5 (A) NEGATIVE mg/dL   Protein, ur 811100 (A) NEGATIVE mg/dL   Nitrite NEGATIVE NEGATIVE   Leukocytes,Ua NEGATIVE NEGATIVE   RBC / HPF 0-5 0 - 5 RBC/hpf   WBC, UA 6-10 0 - 5 WBC/hpf   Bacteria, UA NONE SEEN NONE SEEN   Squamous Epithelial / LPF 0-5 0 - 5   Mucus PRESENT    Hyaline Casts, UA PRESENT     Comment: Performed at [facility], [address] [place], [place] [address]   Dg Chest 2 View  Result Date: 04/20/2019 CLINICAL DATA:  Shortness of breath, weakness, fatigue, night sweats, smoker EXAM: CHEST - 2 VIEW COMPARISON:  None FINDINGS: Normal heart size, mediastinal contours, and pulmonary vascularity. Hyperinflated lungs. No acute infiltrate, pleural effusion or pneumothorax. Osseous structures unremarkable. IMPRESSION: Hyperinflated lungs without acute infiltrate. Electronically Signed   By: Ulyses SouthwardMark  Boles M.D.   On: 04/20/2019 09:36    Pending Labs Unresulted Labs (From admission, onward)    Start     Ordered   04/20/19 1346  Creatinine, serum  (heparin)  Once,   STAT    Comments: Baseline for heparin therapy IF NOT ALREADY DRAWN.    04/20/19 1348   04/20/19 1345  HIV antibody (Routine Testing)  Once,   STAT     04/20/19 1348   04/20/19 1311  Hepatic function panel  Add-on,   AD     04/20/19 1310   04/20/19 1309  Urine Drug Screen, Qualitative ([facility] only)  Add-on,   AD     04/20/19 1309          Vitals/Pain Today's Vitals   04/20/19 0851 04/20/19 0902 04/20/19 1056 04/20/19 1101  BP: 105/78  125/82 117/77  Pulse: 65  100 91  Resp: 20  13 16   Temp: 97.8 F (36.6 C)     TempSrc: Oral     SpO2: 100%  98% 99%  PainSc:  5       Isolation Precautions No active isolations  Medications Medications  HYDROcodone-acetaminophen (NORCO/VICODIN) 5-325 MG per tablet 1 tablet (has no administration in time range)  promethazine (PHENERGAN) tablet 12.5 mg (has no administration  in time range)  heparin injection 5,000 Units (has no administration in  time range)  0.9 %  sodium chloride infusion (has no administration in time range)  thiamine (VITAMIN B-1) tablet 100 mg (has no administration in time range)  multivitamin with minerals tablet 1 tablet (has no administration in time range)  folic acid (FOLVITE) tablet 1 mg (has no administration in time range)  sodium chloride flush (NS) 0.9 % injection 3 mL (3 mLs Intravenous Given 04/20/19 1111)  lactated ringers bolus 1,000 mL (0 mLs Intravenous Stopped 04/20/19 1327)    Mobility walks Low fall risk   Focused Assessments    R Recommendations: See Admitting Provider Note  Report given to:   Additional Notes:

## 2019-04-20 NOTE — Progress Notes (Signed)
Patient has a history of falls, Bed alarm has been activated. Patient is impulsive and refuses to follow instructions.  Patient profile has been completed. Patient denies any pain but admitted to having suicidal thoughts in the past. Will continue to monitor and a notify MD.
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
## 2019-04-20 NOTE — Progress Notes (Signed)
Initial Nutrition Assessment  DOCUMENTATION CODES:   Underweight  INTERVENTION:   Ensure Enlive po TID, each supplement provides 350 kcal and 20 grams of protein  MVI, folic acid and thiamine in setting of substance abuse  NUTRITION DIAGNOSIS:   Inadequate oral intake related to acute illness as evidenced by per patient/family report.  GOAL:   Patient will meet greater than or equal to 90% of their needs  MONITOR:   PO intake, Supplement acceptance, Labs, Weight trends, Skin, I & O's  REASON FOR ASSESSMENT:   Consult Assessment of nutrition requirement/status  ASSESSMENT:   28 y/o male with h/o marijuana abuse and hyperemesis, recent tooth pain and abscess admitted with generalized weakness and rhabdomyolysis  RD working remotely.  Unable to see patient today as pt is still in the ED. Per chart review, pt has presented to the ED multiple times over the past week for different reasons including tooth pain/abscess, cyclic nausea/vomiting and muscle weakness with rhabdomyolysis. Pt with poor appetite and oral intake over the past week. Pt recently moved here in 07/2018 from [place] so no other previous medical history available. There is no weight history in chart. RD will follow up to obtain nutrition related history once patient admitted.     Medications reviewed and include: folic acid, heparin, MVI, thiamine, NaCl @125ml /hr  Labs reviewed: Na 131(L), K 2.9(L), Cl 93(L), BUN 31(H), creat 2.58(H), Ca 10.5(H), alb 5.2(H), AST 52(H), tbili 2.0(H) CK- 1425(H) Wbc- 13.0(H)  Unable to complete Nutrition-Focused physical exam at this time.   Diet Order:   Diet Order            Diet regular Room service appropriate? Yes; Fluid consistency: Thin  Diet effective now             EDUCATION NEEDS:   Not appropriate for education at this time  Skin:  Skin Assessment: (not assessed)  Last BM:  pta  Height:   Ht Readings from Last 1 Encounters:  04/18/19 5\' 9"  (1.753  m)    Weight:   Wt Readings from Last 1 Encounters:  04/18/19 56.7 kg    Ideal Body Weight:  72.7 kg  BMI:  There is no height or weight on file to calculate BMI.  Estimated Nutritional Needs:   Kcal:  2000-2300kcal/day  Protein:  85-100g/day  Fluid:  >1.7L/day  Koleen Distance MS, RD, LDN Pager #- [phone] Office#- [phone] After Hours Pager: [phone]

## 2019-04-20 NOTE — ED Provider Notes (Signed)
[facility] Emergency Department Provider Note   ____________________________________________   First MD Initiated Contact with Patient 04/20/19 [id number]     (approximate)  I have reviewed the triage vital signs and the nursing notes.   HISTORY  Chief Complaint Weakness    HPI Steve Cooley is a 28 y.o. male with past medical history of TBI who presents to the ED complaining of weakness and fatigue.  Patient reports that he has been feeling increasingly weak and fatigued over the past 3 to 4 days.  He has not noticed any fevers, chills, or cough but does state that he has had difficulty catching his breath at times and that he has had diffuse muscle aches.  He denies any nausea, vomiting, or diarrhea.  This is his fourth ED visit in 4 days and work-up yesterday was significant for elevated CPK with normal kidney function.  Patient denies any changes in his medications other than the antibiotic that he was prescribed for a dental infection along with nausea medication.        Past Medical History:  Diagnosis Date  . TBI (traumatic brain injury) [facility](HCC)     Patient Active Problem List   Diagnosis Date Noted  . Rhabdomyolysis 04/20/2019    History reviewed. No pertinent surgical history.  Prior to Admission medications   Medication Sig Start Date End Date Taking? Authorizing Provider  HYDROcodone-acetaminophen (NORCO/VICODIN) 5-325 MG tablet Take 1 tablet by mouth every 6 (six) hours as needed for moderate pain or severe pain. 04/15/19  Yes Bridget HartshornSummers, Rhonda L, PA-C  ondansetron (ZOFRAN ODT) 4 MG disintegrating tablet Take 1 tablet (4 mg total) by mouth every 8 (eight) hours as needed. 04/16/19  Yes Jene EveryKinner, Robert, MD  promethazine (PHENERGAN) 12.5 MG tablet Take 1 tablet (12.5 mg total) by mouth every 6 (six) hours as needed for nausea or vomiting. 04/17/19  Yes McShane, Rudy JewJames A, MD    Allergies Patient has no known allergies.  No family history on file.  Social History Social History   Tobacco Use  . Smoking status: Current Every Day Smoker    Types: Cigarettes  . Smokeless tobacco: Never Used  Substance Use Topics  . Alcohol use: Never    Frequency: Never  . Drug use: Yes    Types: Marijuana    Review of Systems  Constitutional: No fever/chills Eyes: No visual changes. ENT: No sore throat. Cardiovascular: Denies chest pain. Respiratory: Positive for shortness of breath. Gastrointestinal: No abdominal pain.  No nausea, no vomiting.  No diarrhea.  No constipation. Genitourinary: Negative for dysuria. Musculoskeletal: Negative for back pain.  Positive for myalgias. Skin: Negative for rash. Neurological: Negative for headaches, focal weakness or numbness.  ____________________________________________   PHYSICAL EXAM:  VITAL SIGNS: ED Triage Vitals  Enc Vitals Group     BP 04/20/19 0851 105/78     Pulse Rate 04/20/19 0851 65     Resp 04/20/19 0851 20     Temp 04/20/19 0851 97.8 F (36.6 C)     Temp Source 04/20/19 0851 Oral     SpO2 04/20/19 0851 100 %     Weight --      Height --      Head Circumference --      Peak Flow --      Pain Score 04/20/19 0902 5     Pain Loc --      Pain Edu? --      Excl. in GC? --  Constitutional: Alert and oriented. Eyes: Conjunctivae are normal. Head: Atraumatic. Nose: No congestion/rhinnorhea. Mouth/Throat: Mucous membranes are moist. Neck: Normal ROM Cardiovascular: Normal rate, regular rhythm. Grossly normal heart sounds. Respiratory: Normal respiratory effort.  No retractions. Lungs CTAB. Gastrointestinal: Soft and nontender. No distention. Genitourinary: deferred Musculoskeletal: No lower extremity tenderness nor edema.  Diffuse extremity muscle tenderness to palpation. Neurologic:  Normal speech and language. No gross focal neurologic deficits are appreciated. Skin:  Skin is warm, dry and intact. No rash noted. Psychiatric: Mood and affect are normal. Speech and  behavior are normal.  ____________________________________________   LABS (all labs ordered are listed, but only abnormal results are displayed)  Labs Reviewed  BASIC METABOLIC PANEL - Abnormal; Notable for the following components:      Result Value   Sodium 131 (*)    Potassium 2.9 (*)    Chloride 93 (*)    Glucose, Bld 122 (*)    BUN 31 (*)    Creatinine, Ser 2.58 (*)    Calcium 10.5 (*)    GFR calc non Af Amer 33 (*)    GFR calc Af Amer 38 (*)    All other components within normal limits  CBC - Abnormal; Notable for the following components:   WBC 13.0 (*)    RBC 5.85 (*)    Hemoglobin 18.3 (*)    All other components within normal limits  URINALYSIS, COMPLETE (UACMP) WITH MICROSCOPIC - Abnormal; Notable for the following components:   Color, Urine AMBER (*)    APPearance CLOUDY (*)    Specific Gravity, Urine 1.032 (*)    Bilirubin Urine SMALL (*)    Ketones, ur 5 (*)    Protein, ur 100 (*)    All other components within normal limits  CK - Abnormal; Notable for the following components:   Total CK 1,425 (*)    All other components within normal limits  URINE DRUG SCREEN, QUALITATIVE ([facility] ONLY) - Abnormal; Notable for the following components:   Tricyclic, Ur Screen POSITIVE (*)    Opiate, Ur Screen POSITIVE (*)    Cannabinoid 50 Ng, Ur [place] POSITIVE (*)    All other components within normal limits  HEPATIC FUNCTION PANEL - Abnormal; Notable for the following components:   Total Protein 9.6 (*)    Albumin 6.0 (*)    AST 50 (*)    Total Bilirubin 2.2 (*)    Bilirubin, Direct 0.4 (*)    Indirect Bilirubin 1.8 (*)    All other components within normal limits  TROPONIN I (HIGH SENSITIVITY) - Abnormal; Notable for the following components:   Troponin I (High Sensitivity) 20 (*)    All other components within normal limits  SARS CORONAVIRUS 2 (HOSPITAL ORDER, PERFORMED IN [facility])  HIV ANTIBODY (ROUTINE TESTING W REFLEX)  CREATININE, SERUM   TROPONIN I (HIGH SENSITIVITY)   ____________________________________________  EKG  ED ECG REPORT I, Chesley Noonharles Gwenna Fuston, the attending physician, personally viewed and interpreted this ECG.   Date: 04/20/2019  EKG Time: 9:00  Rate: 109  Rhythm: sinus tachycardia  Axis: Normal  Intervals:none  ST&T Change: RBBB   PROCEDURES  Procedure(s) performed (including Critical Care):  Procedures   ____________________________________________   INITIAL IMPRESSION / ASSESSMENT AND PLAN / ED COURSE       28 year old male presenting with ongoing generalized fatigue, weakness, and muscle aches.  His CPK remains elevated with no change compared to yesterday and he now has significant AKI, consistent with rhabdomyolysis.  Will  treat with IV fluids, no clear cause of this at this time as patient remains COVID negative, no apparent medications to cause this, and patient denies drug use.  His chest x-ray is negative for acute process.  Doubt PE as he has no apparent risk factors and is low risk by [place].      ____________________________________________   FINAL CLINICAL IMPRESSION(S) / ED DIAGNOSES  Final diagnoses:  Generalized weakness  AKI (acute kidney injury) ([place])  Non-traumatic rhabdomyolysis     ED Discharge Orders    None       Note:  This document was prepared using Dragon voice recognition software and may include unintentional dictation errors.   Blake Divine, MD 04/20/19 [phone]

## 2019-04-20 NOTE — ED Triage Notes (Signed)
Pt comes via POV from home with c/o weakness, SOB and fatigue. Pt states he has this going on since yesterday. Pt states he has been seen here in ED for last 4 days each for something different.  Pt also states night sweats.

## 2019-04-20 NOTE — Progress Notes (Signed)
Patient has had suicidal thoughts in the past month. Patient states he wants to step in front of a passing car. No current plan to harm himself while in the hospital.

## 2019-04-21 DIAGNOSIS — F32A Depression, unspecified: Secondary | ICD-10-CM | POA: Diagnosis not present

## 2019-04-21 DIAGNOSIS — F3289 Other specified depressive episodes: Secondary | ICD-10-CM

## 2019-04-21 DIAGNOSIS — F329 Major depressive disorder, single episode, unspecified: Secondary | ICD-10-CM | POA: Diagnosis not present

## 2019-04-21 DIAGNOSIS — S069X9A Unspecified intracranial injury with loss of consciousness of unspecified duration, initial encounter: Secondary | ICD-10-CM | POA: Diagnosis present

## 2019-04-21 DIAGNOSIS — S069XAA Unspecified intracranial injury with loss of consciousness status unknown, initial encounter: Secondary | ICD-10-CM | POA: Diagnosis present

## 2019-04-21 LAB — BILIRUBIN, TOTAL: Total Bilirubin: 1.9 mg/dL — ABNORMAL HIGH (ref 0.3–1.2)

## 2019-04-21 LAB — CBC
HCT: 48 % (ref 39.0–52.0)
Hemoglobin: 16.8 g/dL (ref 13.0–17.0)
MCH: 31.2 pg (ref 26.0–34.0)
MCHC: 35 g/dL (ref 30.0–36.0)
MCV: 89.1 fL (ref 80.0–100.0)
Platelets: 249 10*3/uL (ref 150–400)
RBC: 5.39 MIL/uL (ref 4.22–5.81)
RDW: 12.7 % (ref 11.5–15.5)
WBC: 10.4 10*3/uL (ref 4.0–10.5)
nRBC: 0 % (ref 0.0–0.2)

## 2019-04-21 LAB — BASIC METABOLIC PANEL
Anion gap: 9 (ref 5–15)
BUN: 30 mg/dL — ABNORMAL HIGH (ref 6–20)
CO2: 25 mmol/L (ref 22–32)
Calcium: 9.3 mg/dL (ref 8.9–10.3)
Chloride: 100 mmol/L (ref 98–111)
Creatinine, Ser: 1.18 mg/dL (ref 0.61–1.24)
GFR calc Af Amer: >60 mL/min (ref >60)
GFR calc non Af Amer: >60 mL/min (ref >60)
Glucose, Bld: 110 mg/dL — ABNORMAL HIGH (ref 70–99)
Potassium: 3.6 mmol/L (ref 3.5–5.1)
Sodium: 134 mmol/L — ABNORMAL LOW (ref 135–145)

## 2019-04-21 LAB — CORTISOL: Cortisol, Plasma: 15.7 ug/dL

## 2019-04-21 LAB — CK: Total CK: 942 U/L — ABNORMAL HIGH (ref 49–397)

## 2019-04-21 MED ORDER — HALOPERIDOL LACTATE 5 MG/ML IJ SOLN
2.0000 mg | Freq: Once | INTRAMUSCULAR | Status: AC
  Administered 2019-04-21: 2 mg via INTRAVENOUS
  Filled 2019-04-21: qty 1

## 2019-04-21 MED ORDER — HALOPERIDOL LACTATE 5 MG/ML IJ SOLN: 2.0000 mg | Freq: Once | INTRAMUSCULAR | Status: DC | PRN

## 2019-04-21 MED ORDER — LORAZEPAM 2 MG/ML IJ SOLN
1.0000 mg | Freq: Once | INTRAMUSCULAR | Status: AC | PRN
  Administered 2019-04-21: 1 mg via INTRAVENOUS
  Filled 2019-04-21: qty 1

## 2019-04-21 MED ORDER — LORAZEPAM 2 MG/ML IJ SOLN
1.0000 mg | Freq: Once | INTRAMUSCULAR | Status: AC
  Administered 2019-04-21: 1 mg via INTRAVENOUS
  Filled 2019-04-21: qty 1

## 2019-04-21 MED ORDER — SERTRALINE HCL 50 MG PO TABS
25.0000 mg | ORAL_TABLET | Freq: Every day | ORAL | Status: DC
  Administered 2019-04-21 – 2019-04-22 (×2): 25 mg via ORAL
  Filled 2019-04-21 (×2): qty 1
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
## 2019-04-21 NOTE — Plan of Care (Signed)
  Problem: Education: Goal: Knowledge of General Education information will improve Description: Including pain rating scale, medication(s)/side effects and non-pharmacologic comfort measures Outcome: Progressing Note: Patient profile completed.

## 2019-04-21 NOTE — Progress Notes (Signed)
The patient has been taking the cardiac monitoring off and disconnecting the IV infusion to take showers without the staff notice. Multiple showers this shift. Patient cannot process the idea that he doesn't need to take shower while on cardiac monitoring. 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 Notified Dr. Jannifer Franklin and received Ativan 1 mg IV x 1 dose and haldol IV as needed. Will wait and see the effect of the Ativan.

## 2019-04-21 NOTE — Progress Notes (Signed)
Patient is impulsive. Patient continues to ask to take a shower. Staff has explained to patient multiple times that he is on a cardiac  Monitor, receiving iv fluids and heart rate is increasing to the 160's while up. Patient repeatedly attempts to get out of bed to enter shower. Patient completely ignores the iv tubing attached to his arm. The patient is irate and using profanity. Patient asked if his mother could come visit at 31. Explained visiting hours. Patient requested to speak with my superior.  [place] visited with patient and explained to patient about his heart rate and why he is unable to take a shower.  Notified physician about patient's behavior, received orders for IV ativan.
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
## 2019-04-22 LAB — HIV ANTIBODY (ROUTINE TESTING W REFLEX): HIV Screen 4th Generation wRfx: NONREACTIVE

## 2019-04-22 LAB — BASIC METABOLIC PANEL
Anion gap: 7 (ref 5–15)
BUN: 22 mg/dL — ABNORMAL HIGH (ref 6–20)
CO2: 27 mmol/L (ref 22–32)
Calcium: 8.8 mg/dL — ABNORMAL LOW (ref 8.9–10.3)
Chloride: 105 mmol/L (ref 98–111)
Creatinine, Ser: 0.94 mg/dL (ref 0.61–1.24)
GFR calc Af Amer: >60 mL/min (ref >60)
GFR calc non Af Amer: >60 mL/min (ref >60)
Glucose, Bld: 102 mg/dL — ABNORMAL HIGH (ref 70–99)
Potassium: 3.5 mmol/L (ref 3.5–5.1)
Sodium: 139 mmol/L (ref 135–145)

## 2019-04-22 LAB — PHOSPHORUS: Phosphorus: 2.3 mg/dL — ABNORMAL LOW (ref 2.5–4.6)

## 2019-04-22 LAB — CK: Total CK: 606 U/L — ABNORMAL HIGH (ref 49–397)

## 2019-04-22 LAB — CMV IGM: CMV IgM: <30 AU/mL (ref 0.0–29.9)

## 2019-04-22 LAB — MAGNESIUM: Magnesium: 2.4 mg/dL (ref 1.7–2.4)

## 2019-04-22 MED ORDER — SERTRALINE HCL 25 MG PO TABS: 25.0000 mg | ORAL_TABLET | Freq: Every day | ORAL | 1 refills | Status: AC

## 2019-04-22 MED ORDER — TRAZODONE HCL 50 MG PO TABS: 50.0000 mg | ORAL_TABLET | Freq: Every day | ORAL | 1 refills | Status: AC

## 2019-04-22 MED ORDER — ENSURE ENLIVE PO LIQD: 237.0000 mL | Freq: Three times a day (TID) | ORAL | 2 refills | Status: AC

## 2019-04-22 NOTE — Discharge Instructions (Signed)
Smoking cessation  
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
## 2019-04-22 NOTE — Progress Notes (Signed)
Patient has been given information for psyche and Internal medicine out patient follow up. I have spoken with patient as well as his mother. Patient states he drove here, and will drive himself home. States understands all instructions given.
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
## 2019-04-22 NOTE — Progress Notes (Signed)
[place] at [place] was admitted to the Hospital on 04/20/2019 and Discharged  04/22/2019 and should be excused from work/school   For 3  days starting 04/20/2019 , may return to work/school without any restrictions.  Demetrios Loll M.D on 04/22/2019,at 12:16 PM  [place] at [place]

## 2019-04-23 ENCOUNTER — Other Ambulatory Visit: Payer: Self-pay

## 2019-04-23 ENCOUNTER — Encounter: Payer: Self-pay | Admitting: Emergency Medicine

## 2019-04-23 ENCOUNTER — Emergency Department: Payer: Medicaid Other

## 2019-04-23 ENCOUNTER — Emergency Department
Admission: EM | Admit: 2019-04-23 | Discharge: 2019-04-23 | Disposition: A | Discharge status: Home / Self Care | Payer: Medicaid Other | Attending: Emergency Medicine | Admitting: Emergency Medicine

## 2019-04-23 DIAGNOSIS — R112 Nausea with vomiting, unspecified: Secondary | ICD-10-CM | POA: Diagnosis not present

## 2019-04-23 DIAGNOSIS — R109 Unspecified abdominal pain: Secondary | ICD-10-CM

## 2019-04-23 DIAGNOSIS — Z87820 Personal history of traumatic brain injury: Secondary | ICD-10-CM | POA: Insufficient documentation

## 2019-04-23 DIAGNOSIS — K5900 Constipation, unspecified: Secondary | ICD-10-CM | POA: Diagnosis not present

## 2019-04-23 DIAGNOSIS — Z79899 Other long term (current) drug therapy: Secondary | ICD-10-CM | POA: Insufficient documentation

## 2019-04-23 DIAGNOSIS — F17210 Nicotine dependence, cigarettes, uncomplicated: Secondary | ICD-10-CM | POA: Insufficient documentation

## 2019-04-23 DIAGNOSIS — R079 Chest pain, unspecified: Secondary | ICD-10-CM | POA: Diagnosis present

## 2019-04-23 LAB — COMPREHENSIVE METABOLIC PANEL
ALT: 29 U/L (ref 0–44)
AST: 35 U/L (ref 15–41)
Albumin: 5 g/dL (ref 3.5–5.0)
Alkaline Phosphatase: 41 U/L (ref 38–126)
Anion gap: 13 (ref 5–15)
BUN: 21 mg/dL — ABNORMAL HIGH (ref 6–20)
CO2: 26 mmol/L (ref 22–32)
Calcium: 9.9 mg/dL (ref 8.9–10.3)
Chloride: 97 mmol/L — ABNORMAL LOW (ref 98–111)
Creatinine, Ser: 1.22 mg/dL (ref 0.61–1.24)
GFR calc Af Amer: >60 mL/min (ref >60)
GFR calc non Af Amer: >60 mL/min (ref >60)
Glucose, Bld: 104 mg/dL — ABNORMAL HIGH (ref 70–99)
Potassium: 3.5 mmol/L (ref 3.5–5.1)
Sodium: 136 mmol/L (ref 135–145)
Total Bilirubin: 1.7 mg/dL — ABNORMAL HIGH (ref 0.3–1.2)
Total Protein: 7.8 g/dL (ref 6.5–8.1)

## 2019-04-23 LAB — CBC WITH DIFFERENTIAL/PLATELET
Abs Immature Granulocytes: 0.02 10*3/uL (ref 0.00–0.07)
Basophils Absolute: 0 10*3/uL (ref 0.0–0.1)
Basophils Relative: 0 %
Eosinophils Absolute: 0 10*3/uL (ref 0.0–0.5)
Eosinophils Relative: 0 %
HCT: 47.8 % (ref 39.0–52.0)
Hemoglobin: 16.7 g/dL (ref 13.0–17.0)
Immature Granulocytes: 0 %
Lymphocytes Relative: 15 %
Lymphs Abs: 1.6 10*3/uL (ref 0.7–4.0)
MCH: 31.4 pg (ref 26.0–34.0)
MCHC: 34.9 g/dL (ref 30.0–36.0)
MCV: 89.8 fL (ref 80.0–100.0)
Monocytes Absolute: 0.6 10*3/uL (ref 0.1–1.0)
Monocytes Relative: 6 %
Neutro Abs: 8.2 10*3/uL — ABNORMAL HIGH (ref 1.7–7.7)
Neutrophils Relative %: 79 %
Platelets: 262 10*3/uL (ref 150–400)
RBC: 5.32 MIL/uL (ref 4.22–5.81)
RDW: 12.5 % (ref 11.5–15.5)
WBC: 10.4 10*3/uL (ref 4.0–10.5)
nRBC: 0 % (ref 0.0–0.2)

## 2019-04-23 LAB — LIPASE, BLOOD: Lipase: 26 U/L (ref 11–51)

## 2019-04-23 LAB — EPSTEIN-BARR VIRUS VCA, IGM: EBV VCA IgM: <36 U/mL (ref 0.0–35.9)

## 2019-04-23 LAB — EPSTEIN-BARR VIRUS VCA, IGG: EBV VCA IgG: >600 U/mL — ABNORMAL HIGH (ref 0.0–17.9)

## 2019-04-23 LAB — CK: Total CK: 662 U/L — ABNORMAL HIGH (ref 49–397)

## 2019-04-23 LAB — TROPONIN I (HIGH SENSITIVITY): Troponin I (High Sensitivity): 9 ng/L (ref <18)

## 2019-04-23 MED ORDER — SODIUM CHLORIDE 0.9 % IV BOLUS
1000.0000 mL | Freq: Once | INTRAVENOUS | Status: AC
  Administered 2019-04-23: 03:00:00 1000 mL via INTRAVENOUS

## 2019-04-23 MED ORDER — ONDANSETRON HCL 4 MG/2ML IJ SOLN
4.0000 mg | Freq: Once | INTRAMUSCULAR | Status: AC
  Administered 2019-04-23: 03:00:00 4 mg via INTRAVENOUS
  Filled 2019-04-23: qty 2

## 2019-04-23 NOTE — Discharge Instructions (Signed)
Constipation: Take colace twice a day everyday. Take senna once a day at bedtime. Take daily probiotics. Drink plenty of fluids and eat a diet rich in fiber. For the next 3 days take 3600mg or 45ml of 400mg/5ml Milk of Magnesia at bedtime.  ° °

## 2019-04-23 NOTE — ED Notes (Signed)
Pt out in hallway yelling at registration that "the fat bitch unplugged my shit". Pt then proceeded to tell MD Alfred Levins that "I'm done with this shit". Pt then threw his monitor cords at MD Alfred Levins. This RN told pt that his IV needed to be removed. Pt stood while this RN removed IV. Pt escorted out of ED with security.

## 2019-04-23 NOTE — ED Notes (Signed)
Patient brought water and ice chips per request

## 2019-04-23 NOTE — ED Triage Notes (Addendum)
Pt was seen here on 8/21 and admitted to the hospital until Sunday morning with rhabdomyolysis, acute kidney injury, hypokalemia, hyponatremia and leukocytosis; pt presents tonight with c/o pain across the middle of his chest; last BM was 3 days ago; c/o pain to left lower abd; pt says he's vomiting 7 times since he was discharge yesterday; denies shortness of breath; pt says he feels like his heart is beating fast; HR in triage 96

## 2019-04-23 NOTE — ED Notes (Signed)
Wells Guiles RN at bedside to collect CK specimen

## 2019-04-23 NOTE — ED Notes (Signed)
Patient removed IV. Would like IV in another location. This RN made 2 unsuccessful attempts at PIV insertion. April RN at bedside to attempt PIV insertion.

## 2019-04-23 NOTE — ED Notes (Signed)
Report to Jenna, RN  
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
## 2019-04-23 NOTE — ED Notes (Signed)
Patient brought water per request

## 2019-04-23 NOTE — ED Notes (Signed)
Patient given warm blankets at this time.  

## 2019-04-23 NOTE — ED Notes (Signed)
Patient transported to X-ray 

## 2019-04-23 NOTE — ED Notes (Signed)
ED Provider at bedside. 

## 2019-04-24 LAB — RSV(RESPIRATORY SYNCYTIAL VIRUS) AB, BLOOD: RSV Ab: NEGATIVE

## 2019-05-06 ENCOUNTER — Other Ambulatory Visit: Payer: Self-pay

## 2019-05-06 ENCOUNTER — Encounter: Payer: Self-pay | Admitting: Emergency Medicine

## 2019-05-06 ENCOUNTER — Emergency Department
Admission: EM | Admit: 2019-05-06 | Discharge: 2019-05-06 | Disposition: A | Discharge status: Home / Self Care | Payer: Medicaid Other | Attending: Student in an Organized Health Care Education/Training Program | Admitting: Student in an Organized Health Care Education/Training Program

## 2019-05-06 DIAGNOSIS — Z87891 Personal history of nicotine dependence: Secondary | ICD-10-CM | POA: Diagnosis not present

## 2019-05-06 DIAGNOSIS — Z79899 Other long term (current) drug therapy: Secondary | ICD-10-CM | POA: Insufficient documentation

## 2019-05-06 DIAGNOSIS — Z20822 Contact with and (suspected) exposure to covid-19: Secondary | ICD-10-CM

## 2019-05-06 DIAGNOSIS — Z87820 Personal history of traumatic brain injury: Secondary | ICD-10-CM | POA: Insufficient documentation

## 2019-05-06 DIAGNOSIS — R059 Cough, unspecified: Secondary | ICD-10-CM

## 2019-05-06 DIAGNOSIS — Z20828 Contact with and (suspected) exposure to other viral communicable diseases: Secondary | ICD-10-CM | POA: Insufficient documentation

## 2019-05-06 DIAGNOSIS — R05 Cough: Secondary | ICD-10-CM

## 2019-05-06 NOTE — ED Notes (Addendum)
Pt reports needing a COVID swab for work because coworker was positive; denies all s/sx except dry cough for the last 2 weeks

## 2019-05-06 NOTE — Discharge Instructions (Signed)
You have pending test for COVID-19. You will only be notified via phone, if the test result is Positive. You should continue to monitor and treat any symptoms with OTC cough and allergy medicines. Return to the ED as needed.

## 2019-05-06 NOTE — ED Triage Notes (Signed)
Patient states that his coworker had a positive covid test and that he needs to be tested to go back to work. Patient states that he has had a cough times one week.

## 2019-05-06 NOTE — ED Provider Notes (Signed)
[facility] Emergency Department Provider Note ____________________________________________  Time seen: 1945  I have reviewed the triage vital signs and the nursing notes.  HISTORY  Chief Complaint  Cough  HPI Steve Cooley is a 28 y.o. male presents himself to the ED, for COVID testing.  Patient reports that he had a coworker who tested positive for corona virus last week.  Patient was notified by his supervisor about 3 days ago that he needed to present for testing, and be negative before he can return to work.  Patient reports a 2-1/2-week complaint of intermittent cough.  He takes daily Zyrtec, but denies any interim fevers, chills, or sweats.  He also denies any wheezing, congestion, abdominal pain, nausea, vomiting, or diarrhea.  Patient denies any other known COVID exposures, recent travel, or high risk activities.   Past Medical History:  Diagnosis Date  . TBI (traumatic brain injury) [facility](HCC)     Patient Active Problem List   Diagnosis Date Noted  . TBI (traumatic brain injury) (HCC) 04/21/2019  . Depression 04/21/2019  . Rhabdomyolysis 04/20/2019    Past Surgical History:  Procedure Laterality Date  . FACIAL FRACTURE SURGERY     metal plate right side of his face    Prior to Admission medications   Medication Sig Start Date End Date Taking? Authorizing Provider  feeding supplement, ENSURE ENLIVE, (ENSURE ENLIVE) LIQD Take 237 mLs by mouth 3 (three) times daily between meals. 04/22/19   Shaune Pollackhen, Qing, MD  HYDROcodone-acetaminophen (NORCO/VICODIN) 5-325 MG tablet Take 1 tablet by mouth every 6 (six) hours as needed for moderate pain or severe pain. 04/15/19   Tommi RumpsSummers, Rhonda L, PA-C  ondansetron (ZOFRAN ODT) 4 MG disintegrating tablet Take 1 tablet (4 mg total) by mouth every 8 (eight) hours as needed. 04/16/19   Jene EveryKinner, Robert, MD  promethazine (PHENERGAN) 12.5 MG tablet Take 1 tablet (12.5 mg total) by mouth every 6 (six) hours as needed for nausea or  vomiting. 04/17/19   Jeanmarie PlantMcShane, James A, MD  sertraline (ZOLOFT) 25 MG tablet Take 1 tablet (25 mg total) by mouth daily. 04/22/19   Shaune Pollackhen, Qing, MD  traZODone (DESYREL) 50 MG tablet Take 1 tablet (50 mg total) by mouth at bedtime. 04/22/19   Shaune Pollackhen, Qing, MD    Allergies Patient has no known allergies.  No family history on file.  Social History Social History   Tobacco Use  . Smoking status: Former Games developermoker  . Smokeless tobacco: Never Used  Substance Use Topics  . Alcohol use: Yes    Frequency: Never    Comment: occ  . Drug use: Not Currently    Types: Marijuana    Review of Systems  Constitutional: Negative for fever. Eyes: Negative for visual changes. ENT: Negative for sore throat. Cardiovascular: Negative for chest pain. Respiratory: Negative for shortness of breath.  Reports intermittent cough. Gastrointestinal: Negative for abdominal pain, vomiting and diarrhea. Genitourinary: Negative for dysuria. Musculoskeletal: Negative for back pain. Skin: Negative for rash. Neurological: Negative for headaches, focal weakness or numbness. ____________________________________________  PHYSICAL EXAM:  VITAL SIGNS: ED Triage Vitals [05/06/19 1915]  Enc Vitals Group     BP 126/73     Pulse Rate 85     Resp 18     Temp 98.3 F (36.8 C)     Temp Source Oral     SpO2 100 %     Weight 125 lb (56.7 kg)     Height 5\' 9"  (1.753 m)     Head Circumference  Peak Flow      Pain Score 0     Pain Loc      Pain Edu?      Excl. in [place]?     Constitutional: Alert and oriented. Well appearing and in no distress. Head: Normocephalic and atraumatic. Eyes: Conjunctivae are normal. PERRL. Normal extraocular movements Ears: Canals clear. TMs intact bilaterally. Nose: No congestion/rhinorrhea/epistaxis. Mouth/Throat: Mucous membranes are moist.  Uvula is midline and tonsils are flat.  No oral lesions noted. Neck: Supple. No thyromegaly. Hematological/Lymphatic/Immunological: No cervical  lymphadenopathy. Cardiovascular: Normal rate, regular rhythm. Normal distal pulses. Respiratory: Normal respiratory effort. No wheezes/rales/rhonchi. Gastrointestinal: Soft and nontender. No distention. Musculoskeletal: Nontender with normal range of motion in all extremities.  Neurologic:  Normal gait without ataxia. Normal speech and language. No gross focal neurologic deficits are appreciated. ____________________________________________   LABS (pertinent positives/negatives)  Labs Reviewed  SARS CORONAVIRUS 2 (TAT 6-24 HRS)  ____________________________________________  PROCEDURES  Procedures ____________________________________________  INITIAL IMPRESSION / ASSESSMENT AND PLAN / ED COURSE  Patient with ED evaluation of mild intermittent cough.  Patient vital signs are overall benign and reassuring at this time.  No signs of acute respiratory distress, acute febrile illness, or systemic infection.  Patient will be tested for coronavirus, and results will be available and 24 hours or less.  Patient advised to remain at home under quarantine, until the results are available.  Continue with his home medications and may take over-the-counter cough medicine as needed.  Return precautions have been reviewed.  Steve Cooley was evaluated in Emergency Department on 05/06/2019 for the symptoms described in the history of present illness. He was evaluated in the context of the global COVID-19 pandemic, which necessitated consideration that the patient might be at risk for infection with the SARS-CoV-2 virus that causes COVID-19. Institutional protocols and algorithms that pertain to the evaluation of patients at risk for COVID-19 are in a state of rapid change based on information released by regulatory bodies including the CDC and federal and state organizations. These policies and algorithms were followed during the patient's care in the ED. ____________________________________________  FINAL  CLINICAL IMPRESSION(S) / ED DIAGNOSES  Final diagnoses:  Cough  Close Exposure to Covid-19 Virus      Harrie Cazarez, Dannielle Karvonen, PA-C 05/06/19 2002    Merlyn Lot, MD 05/06/19 2042

## 2019-05-06 NOTE — ED Notes (Signed)
FIRST NURSE NOTE:  Pt needs COVID test to go back to work, pt states he had exposure at work, but denies any sxs currently. Pt provided mask on arrival.

## 2019-05-07 LAB — SARS CORONAVIRUS 2 (TAT 6-24 HRS): SARS Coronavirus 2: NEGATIVE

## 2019-07-06 ENCOUNTER — Emergency Department: Payer: Medicaid Other

## 2019-07-06 ENCOUNTER — Emergency Department
Admission: EM | Admit: 2019-07-06 | Discharge: 2019-07-06 | Disposition: A | Discharge status: Home / Self Care | Payer: Medicaid Other | Attending: Emergency Medicine | Admitting: Emergency Medicine

## 2019-07-06 ENCOUNTER — Other Ambulatory Visit: Payer: Self-pay

## 2019-07-06 ENCOUNTER — Encounter: Payer: Self-pay | Admitting: Intensive Care

## 2019-07-06 DIAGNOSIS — R0602 Shortness of breath: Secondary | ICD-10-CM | POA: Insufficient documentation

## 2019-07-06 DIAGNOSIS — F17210 Nicotine dependence, cigarettes, uncomplicated: Secondary | ICD-10-CM | POA: Insufficient documentation

## 2019-07-06 DIAGNOSIS — Z79899 Other long term (current) drug therapy: Secondary | ICD-10-CM | POA: Insufficient documentation

## 2019-07-06 DIAGNOSIS — R1110 Vomiting, unspecified: Secondary | ICD-10-CM | POA: Diagnosis present

## 2019-07-06 DIAGNOSIS — Z87820 Personal history of traumatic brain injury: Secondary | ICD-10-CM | POA: Diagnosis not present

## 2019-07-06 LAB — CBC
HCT: 43.9 % (ref 39.0–52.0)
Hemoglobin: 15.3 g/dL (ref 13.0–17.0)
MCH: 31 pg (ref 26.0–34.0)
MCHC: 34.9 g/dL (ref 30.0–36.0)
MCV: 88.9 fL (ref 80.0–100.0)
Platelets: 277 10*3/uL (ref 150–400)
RBC: 4.94 MIL/uL (ref 4.22–5.81)
RDW: 13.7 % (ref 11.5–15.5)
WBC: 15.5 10*3/uL — ABNORMAL HIGH (ref 4.0–10.5)
nRBC: 0 % (ref 0.0–0.2)

## 2019-07-06 LAB — URINALYSIS, COMPLETE (UACMP) WITH MICROSCOPIC
Bacteria, UA: NONE SEEN
Bilirubin Urine: NEGATIVE
Glucose, UA: NEGATIVE mg/dL
Hgb urine dipstick: NEGATIVE
Ketones, ur: 5 mg/dL — AB
Leukocytes,Ua: NEGATIVE
Nitrite: NEGATIVE
Protein, ur: NEGATIVE mg/dL
Specific Gravity, Urine: 1.019 (ref 1.005–1.030)
Squamous Epithelial / HPF: NONE SEEN (ref 0–5)
pH: 6 (ref 5.0–8.0)

## 2019-07-06 LAB — LIPASE, BLOOD: Lipase: 29 U/L (ref 11–51)

## 2019-07-06 LAB — COMPREHENSIVE METABOLIC PANEL
ALT: 31 U/L (ref 0–44)
AST: 30 U/L (ref 15–41)
Albumin: 5.3 g/dL — ABNORMAL HIGH (ref 3.5–5.0)
Alkaline Phosphatase: 54 U/L (ref 38–126)
Anion gap: 10 (ref 5–15)
BUN: 13 mg/dL (ref 6–20)
CO2: 23 mmol/L (ref 22–32)
Calcium: 10.2 mg/dL (ref 8.9–10.3)
Chloride: 108 mmol/L (ref 98–111)
Creatinine, Ser: 1.21 mg/dL (ref 0.61–1.24)
GFR calc Af Amer: >60 mL/min (ref >60)
GFR calc non Af Amer: >60 mL/min (ref >60)
Glucose, Bld: 135 mg/dL — ABNORMAL HIGH (ref 70–99)
Potassium: 3.8 mmol/L (ref 3.5–5.1)
Sodium: 141 mmol/L (ref 135–145)
Total Bilirubin: 0.9 mg/dL (ref 0.3–1.2)
Total Protein: 8.5 g/dL — ABNORMAL HIGH (ref 6.5–8.1)

## 2019-07-06 LAB — CK: Total CK: 328 U/L (ref 49–397)

## 2019-07-06 IMAGING — CR DG CHEST 2V
1 series · 2 of 2 positions shown · non-contrast
Comparison: [DATE]

CLINICAL DATA: Shortness of breath

EXAM:
CHEST - 2 VIEW

[Series 1: dg chest 2 view · 0.14mm/px · 2 of 2 slices shown]
[im 1/2]
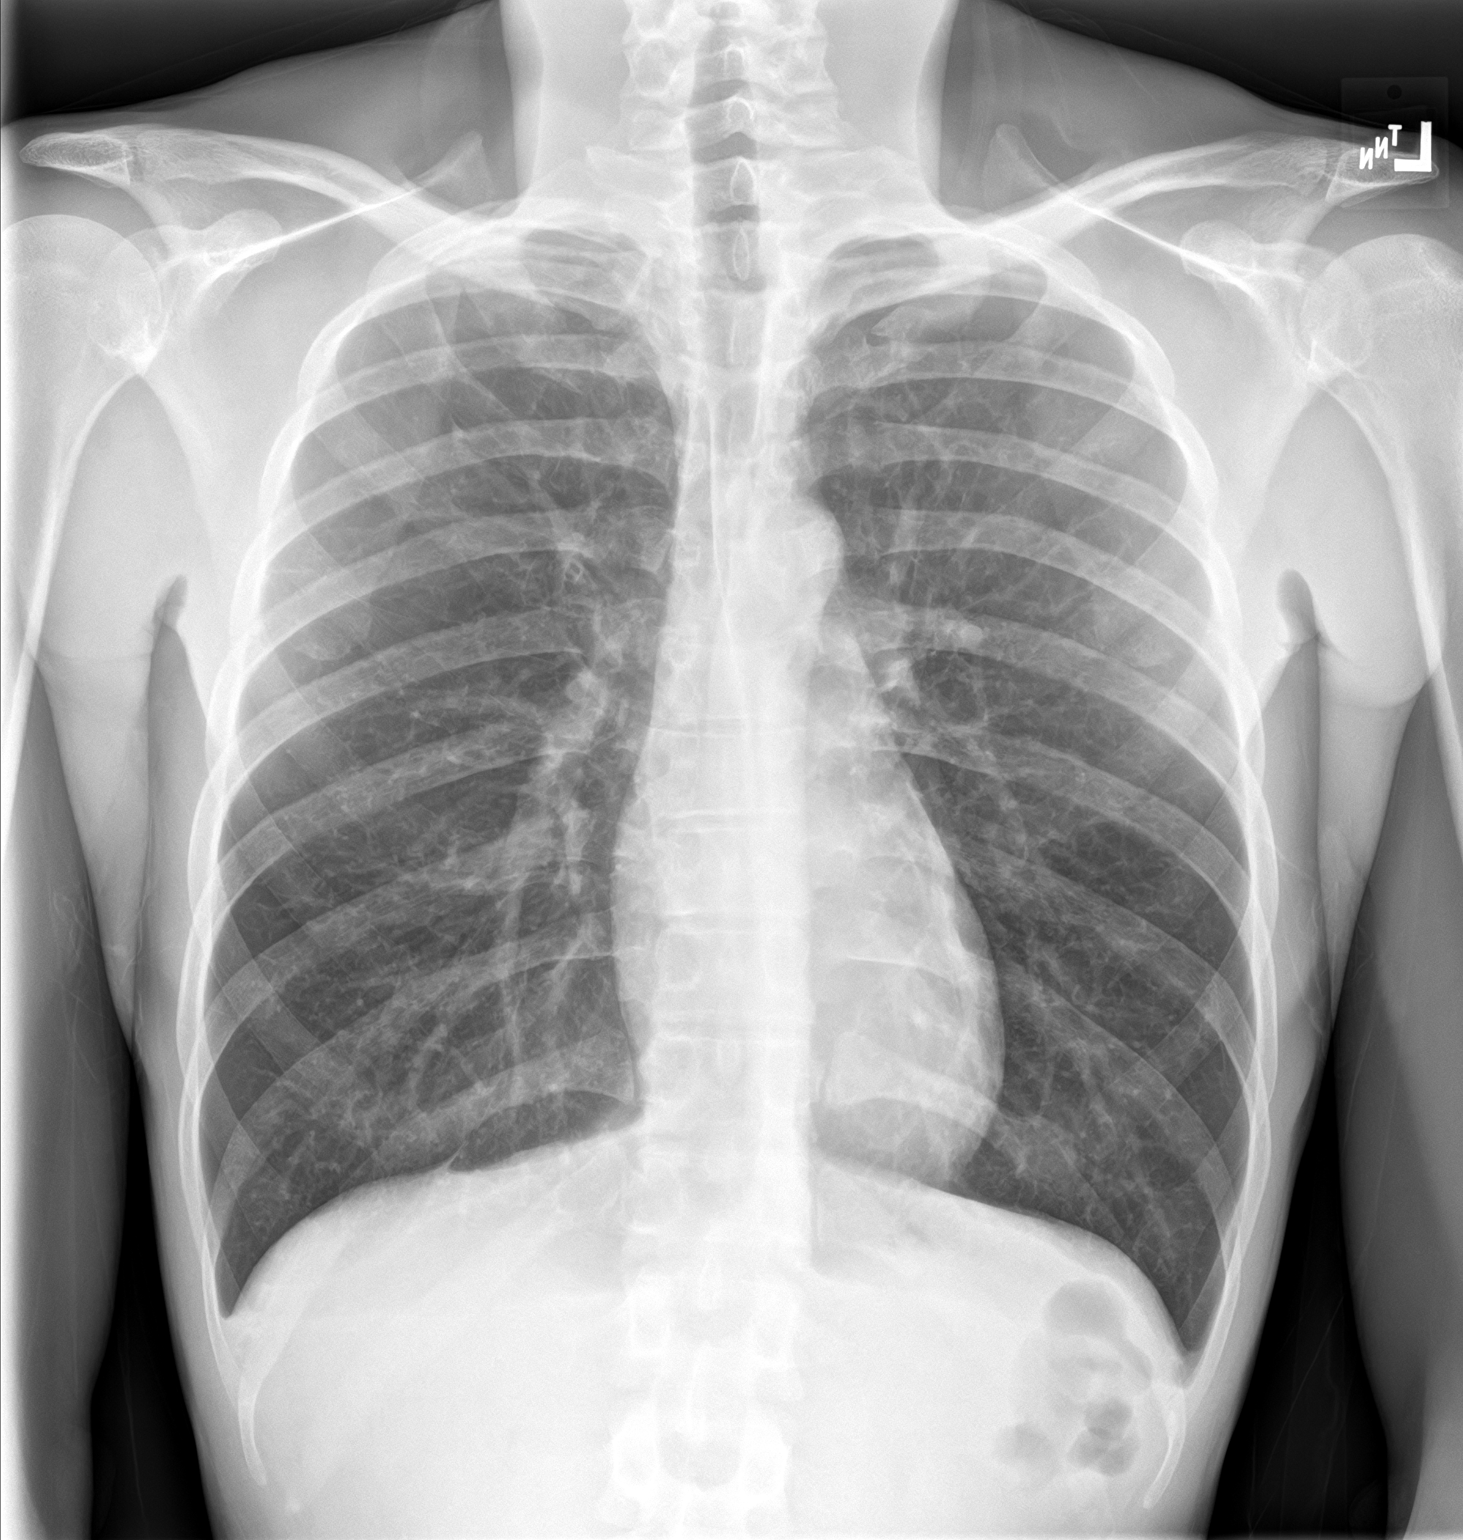
[im 2/2]
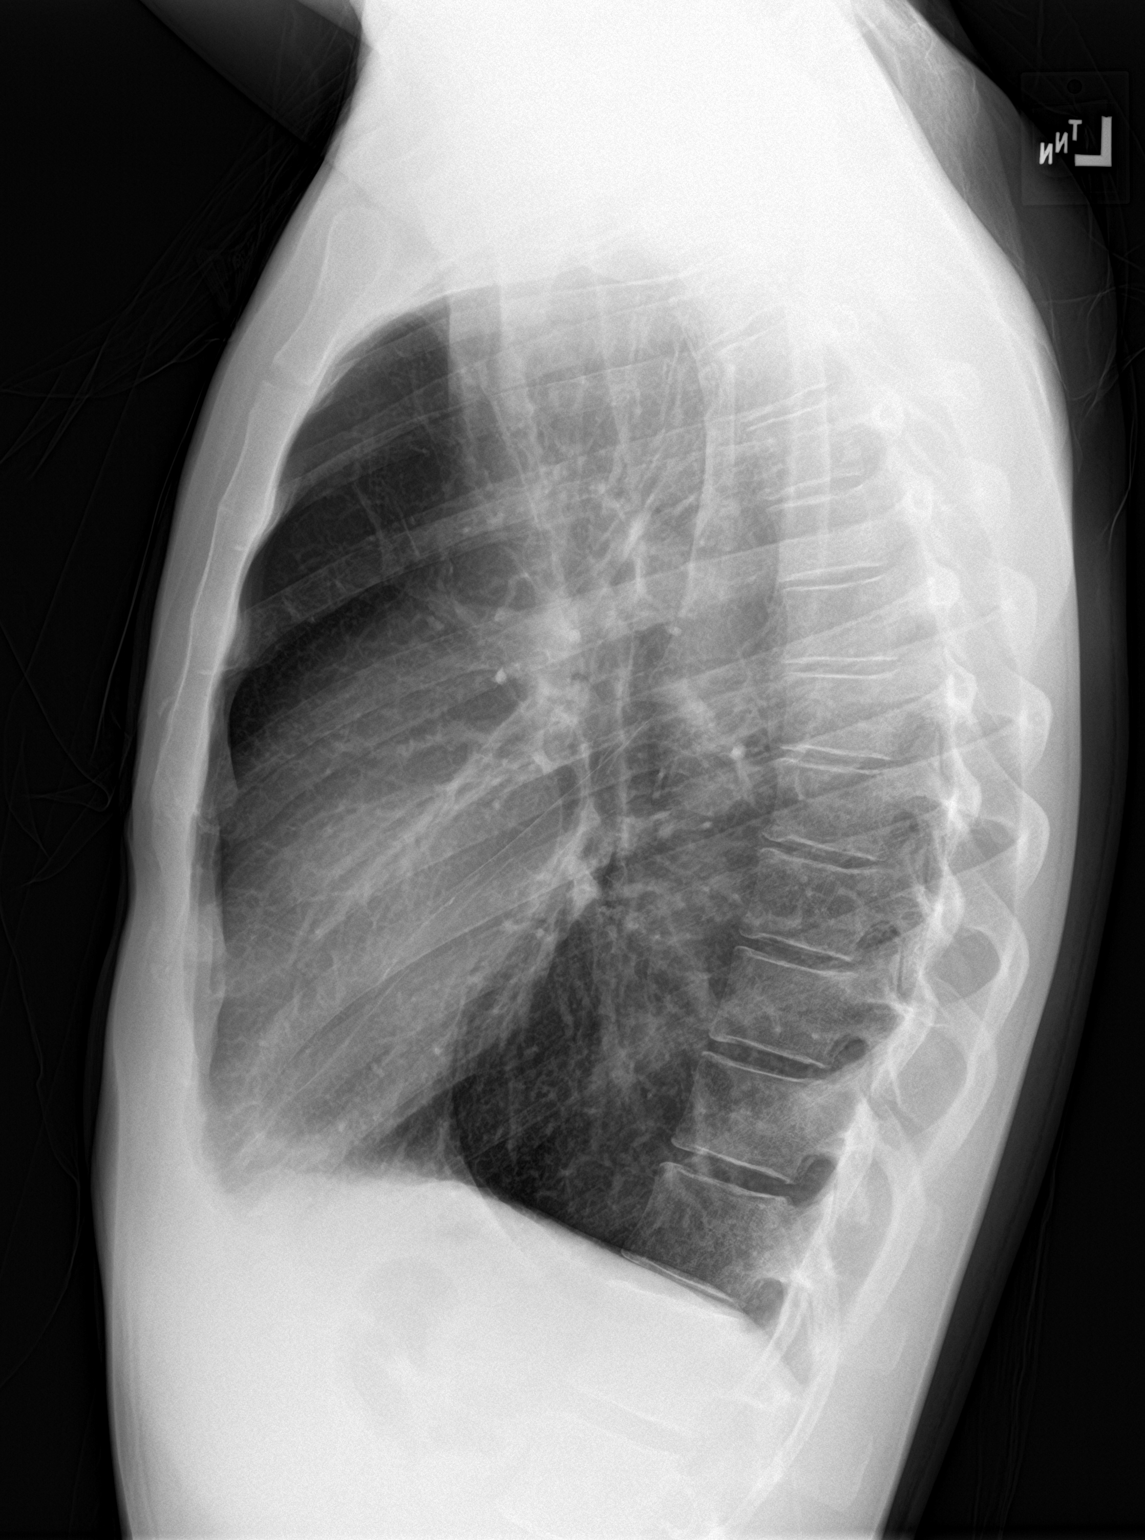

[2 of 2 positions shown; findings below may reference images not displayed]

FINDINGS: The heart size and mediastinal contours are within normal limits.
Both lungs are clear. The visualized skeletal structures are
unremarkable.
IMPRESSION: No active cardiopulmonary disease.

## 2019-07-06 MED ORDER — ONDANSETRON HCL 4 MG/2ML IJ SOLN
4.0000 mg | Freq: Once | INTRAMUSCULAR | Status: AC
  Administered 2019-07-06: 17:00:00 4 mg via INTRAVENOUS
  Filled 2019-07-06: qty 2

## 2019-07-06 MED ORDER — DICYCLOMINE HCL 10 MG/ML IM SOLN
20.0000 mg | Freq: Once | INTRAMUSCULAR | Status: AC
  Administered 2019-07-06: 19:00:00 20 mg via INTRAMUSCULAR
  Filled 2019-07-06: qty 2

## 2019-07-06 MED ORDER — CAPSAICIN 0.075 % EX GEL: 1.0000 "application " | Freq: Four times a day (QID) | CUTANEOUS | 0 refills | Status: DC

## 2019-07-06 MED ORDER — CAPSAICIN 0.025 % EX CREA
TOPICAL_CREAM | Freq: Once | CUTANEOUS | Status: AC
  Administered 2019-07-06: 19:00:00 via TOPICAL
  Filled 2019-07-06: qty 60

## 2019-07-06 MED ORDER — ONDANSETRON HCL 4 MG PO TABS: 4.0000 mg | ORAL_TABLET | Freq: Three times a day (TID) | ORAL | 0 refills | Status: DC | PRN

## 2019-07-06 MED ORDER — CAPSAICIN 0.075 % EX CREA: TOPICAL_CREAM | Freq: Two times a day (BID) | CUTANEOUS | Status: DC

## 2019-07-06 MED ORDER — ONDANSETRON 4 MG PO TBDP
4.0000 mg | ORAL_TABLET | Freq: Once | ORAL | Status: AC | PRN
  Administered 2019-07-06: 4 mg via ORAL
  Filled 2019-07-06: qty 1

## 2019-07-06 MED ORDER — SODIUM CHLORIDE 0.9 % IV BOLUS
1000.0000 mL | Freq: Once | INTRAVENOUS | Status: AC
  Administered 2019-07-06: 17:00:00 1000 mL via INTRAVENOUS

## 2019-07-06 MED ORDER — ONDANSETRON HCL 4 MG/2ML IJ SOLN
4.0000 mg | Freq: Once | INTRAMUSCULAR | Status: AC
  Administered 2019-07-06: 19:00:00 4 mg via INTRAVENOUS
  Filled 2019-07-06: qty 2

## 2019-07-06 MED ORDER — CAPSAICIN 0.075 % EX GEL: 1.0000 "application " | Freq: Four times a day (QID) | CUTANEOUS | 0 refills | Status: AC

## 2019-07-06 MED ORDER — ONDANSETRON HCL 4 MG PO TABS: 4.0000 mg | ORAL_TABLET | Freq: Three times a day (TID) | ORAL | 0 refills | Status: AC | PRN

## 2019-07-06 NOTE — ED Notes (Signed)
This RN introduced self to pt. Pt reports chills, sweats, abdominal pain and nausea. Pt states abdominal pain is generalized and pt is tender in all four quadrants.

## 2019-07-06 NOTE — ED Triage Notes (Signed)
Patient c/o severe abd pain, chills/sweats, and emesis since this AM.

## 2019-07-06 NOTE — ED Notes (Signed)
Patients TV turned on for him

## 2019-07-06 NOTE — ED Notes (Signed)
This Rn called lab to add on CK to light green tube

## 2019-07-06 NOTE — ED Provider Notes (Signed)
[facility] Emergency Department Provider Note  ____________________________________________  Time seen: Approximately 6:01 PM  I have reviewed the triage vital signs and the nursing notes.   HISTORY  Chief Complaint Abdominal Pain, Chills, and Emesis    HPI Steve Cooley is a 28 y.o. male presents to the emergency department with vomiting and diarrhea as well as generalized abdominal pain that started today.  Patient states that he is a heavy marijuana smoker and has experienced these symptoms many times in the past.  He is also complaining of occasional shortness of breath.  He denies cough, chest pain or chest tightness.  He denies hemoptysis or hematochezia.  States that it has been 2 days since he smoked his last joint of marijuana and he typically smokes 1 joint daily.        Past Medical History:  Diagnosis Date  . TBI (traumatic brain injury) [facility](HCC)     Patient Active Problem List   Diagnosis Date Noted  . TBI (traumatic brain injury) (HCC) 04/21/2019  . Depression 04/21/2019  . Rhabdomyolysis 04/20/2019    Past Surgical History:  Procedure Laterality Date  . FACIAL FRACTURE SURGERY     metal plate right side of his face    Prior to Admission medications   Medication Sig Start Date End Date Taking? Authorizing Provider  Capsaicin 0.075 % GEL Apply 1 application topically 4 (four) times daily. 07/06/19   Orvil FeilWoods, Jillene Wehrenberg M, PA-C  feeding supplement, ENSURE ENLIVE, (ENSURE ENLIVE) LIQD Take 237 mLs by mouth 3 (three) times daily between meals. 04/22/19   Shaune Pollackhen, Qing, MD  HYDROcodone-acetaminophen (NORCO/VICODIN) 5-325 MG tablet Take 1 tablet by mouth every 6 (six) hours as needed for moderate pain or severe pain. 04/15/19   Tommi RumpsSummers, Rhonda L, PA-C  ondansetron (ZOFRAN ODT) 4 MG disintegrating tablet Take 1 tablet (4 mg total) by mouth every 8 (eight) hours as needed. 04/16/19   Jene EveryKinner, Robert, MD  ondansetron (ZOFRAN) 4 MG tablet Take 1 tablet (4 mg  total) by mouth every 8 (eight) hours as needed for up to 10 days for nausea or vomiting. 07/06/19 07/16/19  Orvil FeilWoods, Sheralyn Pinegar M, PA-C  promethazine (PHENERGAN) 12.5 MG tablet Take 1 tablet (12.5 mg total) by mouth every 6 (six) hours as needed for nausea or vomiting. 04/17/19   Jeanmarie PlantMcShane, James A, MD  sertraline (ZOLOFT) 25 MG tablet Take 1 tablet (25 mg total) by mouth daily. 04/22/19   Shaune Pollackhen, Qing, MD  traZODone (DESYREL) 50 MG tablet Take 1 tablet (50 mg total) by mouth at bedtime. 04/22/19   Shaune Pollackhen, Qing, MD    Allergies Patient has no known allergies.  History reviewed. No pertinent family history.  Social History Social History   Tobacco Use  . Smoking status: Current Every Day Smoker    Types: Cigarettes  . Smokeless tobacco: Never Used  Substance Use Topics  . Alcohol use: Yes    Frequency: Never    Comment: occ  . Drug use: Yes    Types: Marijuana     Review of Systems  Constitutional: No fever/chills Eyes: No visual changes. No discharge ENT: No upper respiratory complaints. Cardiovascular: no chest pain. Respiratory: no cough. No SOB. Gastrointestinal: No abdominal pain.  Patient has nausea and vomiting.  No constipation. Genitourinary: Negative for dysuria. No hematuria Musculoskeletal: Negative for musculoskeletal pain. Skin: Negative for rash, abrasions, lacerations, ecchymosis. Neurological: Negative for headaches, focal weakness or numbness.   ____________________________________________   PHYSICAL EXAM:  VITAL SIGNS: ED Triage Vitals  Enc Vitals Group     BP 07/06/19 1418 (!) 160/90     Pulse Rate 07/06/19 1418 60     Resp 07/06/19 1418 15     Temp 07/06/19 1418 97.7 F (36.5 C)     Temp Source 07/06/19 1418 Oral     SpO2 07/06/19 1418 100 %     Weight 07/06/19 1424 145 lb (65.8 kg)     Height 07/06/19 1424 5\' 9"  (1.753 m)     Head Circumference --      Peak Flow --      Pain Score 07/06/19 1424 10     Pain Loc --      Pain Edu? --      Excl. in [place]?  --      Constitutional: Alert and oriented. Well appearing and in no acute distress. Eyes: Conjunctivae are normal. PERRL. EOMI. Head: Atraumatic. ENT: Cardiovascular: Normal rate, regular rhythm. Normal S1 and S2.  Good peripheral circulation. Respiratory: Normal respiratory effort without tachypnea or retractions. Lungs CTAB. Good air entry to the bases with no decreased or absent breath sounds. Gastrointestinal: Bowel sounds 4 quadrants. Soft and nontender to palpation. No guarding or rigidity. No palpable masses. No distention. No CVA tenderness. Musculoskeletal: Full range of motion to all extremities. No gross deformities appreciated. Neurologic:  Normal speech and language. No gross focal neurologic deficits are appreciated.  Skin:  Skin is warm, dry and intact. No rash noted. Psychiatric: Mood and affect are normal. Speech and behavior are normal. Patient exhibits appropriate insight and judgement.   ____________________________________________   LABS (all labs ordered are listed, but only abnormal results are displayed)  Labs Reviewed  COMPREHENSIVE METABOLIC PANEL - Abnormal; Notable for the following components:      Result Value   Glucose, Bld 135 (*)    Total Protein 8.5 (*)    Albumin 5.3 (*)    All other components within normal limits  CBC - Abnormal; Notable for the following components:   WBC 15.5 (*)    All other components within normal limits  URINALYSIS, COMPLETE (UACMP) WITH MICROSCOPIC - Abnormal; Notable for the following components:   Color, Urine YELLOW (*)    APPearance CLEAR (*)    Ketones, ur 5 (*)    All other components within normal limits  LIPASE, BLOOD  CK   ____________________________________________  EKG   ____________________________________________  RADIOLOGY I personally viewed and evaluated these images as part of my medical decision making, as well as reviewing the written report by the radiologist.  Dg Chest 2  View  Result Date: 07/06/2019 CLINICAL DATA:  Shortness of breath EXAM: CHEST - 2 VIEW COMPARISON:  04/23/2019 FINDINGS: The heart size and mediastinal contours are within normal limits. Both lungs are clear. The visualized skeletal structures are unremarkable. IMPRESSION: No active cardiopulmonary disease. Electronically Signed   By: 04/25/2019 M.D.   On: 07/06/2019 17:11    ____________________________________________    PROCEDURES  Procedure(s) performed:    Procedures    Medications  ondansetron (ZOFRAN-ODT) disintegrating tablet 4 mg (4 mg Oral Given 07/06/19 1428)  sodium chloride 0.9 % bolus 1,000 mL (1,000 mLs Intravenous New Bag/Given 07/06/19 1704)  ondansetron (ZOFRAN) injection 4 mg (4 mg Intravenous Given 07/06/19 1704)  capsaicin (ZOSTRIX) 0.025 % cream ( Topical Given 07/06/19 1832)  dicyclomine (BENTYL) injection 20 mg (20 mg Intramuscular Given 07/06/19 1905)  ondansetron (ZOFRAN) injection 4 mg (4 mg Intravenous Given 07/06/19 1906)     ____________________________________________  INITIAL IMPRESSION / ASSESSMENT AND PLAN / ED COURSE  Pertinent labs & imaging results that were available during my care of the patient were reviewed by me and considered in my medical decision making (see chart for details).  Review of the [place] CSRS was performed in accordance of the [place] prior to dispensing any controlled drugs.           Assessment and plan Hyperemesis 28 year old male presents to the emergency department with nausea and vomiting that started acutely today.  Patient was hypertensive at triage but vital signs were otherwise reassuring.  Patient's abdomen was soft and nontender.  There was use of emesis bag in exam room.  Patient was mildly diaphoretic and appeared uncomfortable in a fetal position.  Differential diagnosis included marijuana hyperemesis syndrome, gastroenteritis, COVID-19,   Patient had leukocytosis evident on CBC, 15.5.  Patient has  history of nontraumatic rhabdo and CK was 328.  Lipase was within reference range.  CMP was reassuring...  Patient reported that his abdominal pain resolved with capsaicin cream applied in the emergency department, IV Zofran and supplemental fluids.  He was asking to eat and stating that he felt like he needed to go home and take a hot shower.  Patient was discharged with a prescription for capsaicin and Zofran with high suspicion for cannabinoid hyperemesis syndrome.  Patient was advised to follow-up with primary care as needed.  All patient questions were answered.  ____________________________________________  FINAL CLINICAL IMPRESSION(S) / ED DIAGNOSES  Final diagnoses:  Hyperemesis      NEW MEDICATIONS STARTED DURING THIS VISIT:  ED Discharge Orders         Ordered    Capsaicin 0.075 % GEL  4 times daily     07/06/19 1927    ondansetron (ZOFRAN) 4 MG tablet  Every 8 hours PRN     07/06/19 1928              This chart was dictated using voice recognition software/Dragon. Despite best efforts to proofread, errors can occur which can change the meaning. Any change was purely unintentional.    Lannie Fields, PA-C 07/06/19 1933    Nance Pear, MD 07/06/19 2029

## 2019-07-06 NOTE — ED Provider Notes (Signed)
Steve Cooley, attending physician, personally viewed and interpreted this EKG  EKG Time: 1852 Rate: 79 Rhythm: normal sinus rhythm Axis: normal Intervals: qtc 479 QRS: RBBB ST changes: no st elevation Impression: abnormal ekg. RBBB present on previous ekg dated from August    Nance Pear, MD 07/06/19 1857

## 2019-07-06 NOTE — ED Notes (Signed)
Pt given ice chips. Given ok by MID.

## 2019-07-08 ENCOUNTER — Inpatient Hospital Stay
Admission: EM | Admit: 2019-07-08 | Discharge: 2019-07-11 | DRG: 469 | Disposition: A | Discharge status: Home / Self Care | Payer: Medicaid HMO | Attending: Hospitalist | Admitting: Hospitalist

## 2019-07-08 ENCOUNTER — Emergency Department: Payer: Medicaid HMO

## 2019-07-08 ENCOUNTER — Inpatient Hospital Stay: Payer: Medicaid HMO

## 2019-07-08 DIAGNOSIS — Z79899 Other long term (current) drug therapy: Secondary | ICD-10-CM

## 2019-07-08 DIAGNOSIS — N179 Acute kidney failure, unspecified: Principal | ICD-10-CM | POA: Diagnosis present

## 2019-07-08 DIAGNOSIS — F319 Bipolar disorder, unspecified: Secondary | ICD-10-CM | POA: Diagnosis present

## 2019-07-08 DIAGNOSIS — F1211 Cannabis abuse, in remission: Secondary | ICD-10-CM | POA: Diagnosis present

## 2019-07-08 DIAGNOSIS — S069X9S Unspecified intracranial injury with loss of consciousness of unspecified duration, sequela: Secondary | ICD-10-CM

## 2019-07-08 DIAGNOSIS — D751 Secondary polycythemia: Secondary | ICD-10-CM | POA: Diagnosis present

## 2019-07-08 DIAGNOSIS — Z87891 Personal history of nicotine dependence: Secondary | ICD-10-CM

## 2019-07-08 DIAGNOSIS — E869 Volume depletion, unspecified: Secondary | ICD-10-CM | POA: Diagnosis present

## 2019-07-08 DIAGNOSIS — F1220 Cannabis dependence, uncomplicated: Secondary | ICD-10-CM

## 2019-07-08 DIAGNOSIS — N2589 Other disorders resulting from impaired renal tubular function: Secondary | ICD-10-CM | POA: Diagnosis present

## 2019-07-08 DIAGNOSIS — Z87820 Personal history of traumatic brain injury: Secondary | ICD-10-CM

## 2019-07-08 DIAGNOSIS — R6510 Systemic inflammatory response syndrome (SIRS) of non-infectious origin without acute organ dysfunction: Secondary | ICD-10-CM

## 2019-07-08 DIAGNOSIS — R112 Nausea with vomiting, unspecified: Secondary | ICD-10-CM | POA: Diagnosis present

## 2019-07-08 DIAGNOSIS — M6282 Rhabdomyolysis: Secondary | ICD-10-CM | POA: Diagnosis present

## 2019-07-08 DIAGNOSIS — R079 Chest pain, unspecified: Secondary | ICD-10-CM

## 2019-07-08 LAB — COMPREHENSIVE METABOLIC PANEL
ALT: 33 U/L (ref 0–55)
AST (SGOT): 38 U/L — ABNORMAL HIGH (ref 5–34)
Albumin/Globulin Ratio: 1.5 (ref 0.9–2.2)
Albumin: 6.3 g/dL — ABNORMAL HIGH (ref 3.5–5.0)
Alkaline Phosphatase: 80 U/L (ref 38–106)
Anion Gap: 22 — ABNORMAL HIGH (ref 5.0–15.0)
BUN: 34 mg/dL — ABNORMAL HIGH (ref 9.0–28.0)
Bilirubin, Total: 1.2 mg/dL (ref 0.2–1.2)
CO2: 18 mEq/L — ABNORMAL LOW (ref 22–29)
Calcium: 12 mg/dL — ABNORMAL HIGH (ref 8.5–10.5)
Chloride: 98 mEq/L — ABNORMAL LOW (ref 100–111)
Creatinine: 5.2 mg/dL — ABNORMAL HIGH (ref 0.7–1.3)
Globulin: 4.3 g/dL — ABNORMAL HIGH (ref 2.0–3.6)
Glucose: 110 mg/dL — ABNORMAL HIGH (ref 70–100)
Potassium: 4.8 mEq/L (ref 3.5–5.1)
Protein, Total: 10.6 g/dL — ABNORMAL HIGH (ref 6.0–8.3)
Sodium: 138 mEq/L (ref 136–145)

## 2019-07-08 LAB — CBC AND DIFFERENTIAL
Absolute NRBC: 0 10*3/uL (ref 0.00–0.00)
Basophils Absolute Automated: 0.07 10*3/uL (ref 0.00–0.08)
Basophils Automated: 0.4 %
Eosinophils Absolute Automated: 0.01 10*3/uL (ref 0.00–0.44)
Eosinophils Automated: 0.1 %
Hematocrit: 56.6 % — ABNORMAL HIGH (ref 37.6–49.6)
Hgb: 19.8 g/dL — ABNORMAL HIGH (ref 12.5–17.1)
Immature Granulocytes Absolute: 0.15 10*3/uL — ABNORMAL HIGH (ref 0.00–0.07)
Immature Granulocytes: 0.8 %
Lymphocytes Absolute Automated: 1.91 10*3/uL (ref 0.42–3.22)
Lymphocytes Automated: 9.7 %
MCH: 31 pg (ref 25.1–33.5)
MCHC: 35 g/dL (ref 31.5–35.8)
MCV: 88.6 fL (ref 78.0–96.0)
MPV: 11.9 fL (ref 8.9–12.5)
Monocytes Absolute Automated: 1.04 10*3/uL — ABNORMAL HIGH (ref 0.21–0.85)
Monocytes: 5.3 %
Neutrophils Absolute: 16.57 10*3/uL — ABNORMAL HIGH (ref 1.10–6.33)
Neutrophils: 83.7 %
Nucleated RBC: 0 /100 WBC (ref 0.0–0.0)
Platelets: 330 10*3/uL (ref 142–346)
RBC: 6.39 10*6/uL — ABNORMAL HIGH (ref 4.20–5.90)
RDW: 15 % (ref 11–15)
WBC: 19.75 10*3/uL — ABNORMAL HIGH (ref 3.10–9.50)

## 2019-07-08 LAB — LIPASE: Lipase: 58 U/L (ref 8–78)

## 2019-07-08 LAB — GFR: EGFR: 16.1

## 2019-07-08 MED ORDER — PLASMA-LYTE A IV INFUSION
INTRAVENOUS | Status: AC
Start: 2019-07-08 — End: 2019-07-09

## 2019-07-08 MED ORDER — PLASMA-LYTE A IV INFUSION
INTRAVENOUS | Status: DC
Start: 2019-07-08 — End: 2019-07-08

## 2019-07-08 MED ORDER — MORPHINE SULFATE 4 MG/ML IJ/IV SOLN (WRAP)
4.0000 mg | Freq: Once | Status: AC
Start: 2019-07-08 — End: 2019-07-08
  Administered 2019-07-08: 23:00:00 4 mg via INTRAVENOUS
  Filled 2019-07-08: qty 1

## 2019-07-08 MED ORDER — LORAZEPAM 1 MG PO TABS
1.0000 mg | ORAL_TABLET | Freq: Once | ORAL | Status: AC
Start: 2019-07-08 — End: 2019-07-08
  Administered 2019-07-08: 23:00:00 1 mg via ORAL
  Filled 2019-07-08: qty 1

## 2019-07-08 MED ORDER — SODIUM CHLORIDE 0.9 % IV BOLUS
1000.0000 mL | Freq: Once | INTRAVENOUS | Status: AC
Start: 2019-07-08 — End: 2019-07-08
  Administered 2019-07-08: 22:00:00 1000 mL via INTRAVENOUS

## 2019-07-08 MED ORDER — ONDANSETRON HCL 4 MG/2ML IJ SOLN
4.0000 mg | Freq: Once | INTRAMUSCULAR | Status: AC
Start: 2019-07-08 — End: 2019-07-08
  Administered 2019-07-08: 22:00:00 4 mg via INTRAVENOUS
  Filled 2019-07-08: qty 2

## 2019-07-08 MED ORDER — HALOPERIDOL LACTATE 5 MG/ML IJ SOLN
2.0000 mg | Freq: Once | INTRAMUSCULAR | Status: AC
Start: 2019-07-08 — End: 2019-07-08
  Administered 2019-07-08: 23:00:00 2 mg via INTRAMUSCULAR
  Filled 2019-07-08: qty 1

## 2019-07-08 MED ORDER — SODIUM CHLORIDE 0.9 % IV BOLUS
1000.0000 mL | Freq: Once | INTRAVENOUS | Status: DC
Start: 2019-07-08 — End: 2019-07-11

## 2019-07-08 NOTE — ED Triage Notes (Signed)
Patient arrives to triage via wheelchair with complaints of diffuse abdominal pain and hyperemesis x 4 days; patient was down in [place] and treated for emesis but discharged home. Patient states he feels no improvement.

## 2019-07-08 NOTE — ED Notes (Signed)
[facility] EMERGENCY DEPT  ED NURSING NOTE FOR THE RECEIVING INPATIENT NURSE   ED NURSE Loyola Mast [id number]   ED CHARGE RN Tawanna Cooler   ADMISSION INFORMATION   Joel Alatorre. is a 28 y.o. male admitted with an ED diagnosis of:    1. Acute kidney failure         Isolation: None   Allergies: Patient has no known allergies.   Holding Orders confirmed? N/A   Belongings Documented? N/A   Home medications sent to pharmacy confirmed? N/A   NURSING CARE   Patient Comes From:   Mental Status: Home Independent  alert and oriented   ADL: Independent with all ADLs   Ambulation: no difficulty   Pertinent Information  and Safety Concerns: pt stopped smoking marijuan recently, is now tachycardic with multiple bouts of emesis.      CT / NIH   CT Head ordered on this patient?  N/A   NIH/Dysphagia assessment done prior to admission? N/A   VITAL SIGNS (at the time of this note)      Vitals:    07/08/19 2234   BP:    Pulse: (!) 132   Resp: (!) 27   Temp:    SpO2: 100%
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
## 2019-07-09 ENCOUNTER — Inpatient Hospital Stay: Payer: Medicaid HMO

## 2019-07-09 DIAGNOSIS — N179 Acute kidney failure, unspecified: Principal | ICD-10-CM

## 2019-07-09 DIAGNOSIS — R000 Tachycardia, unspecified: Secondary | ICD-10-CM

## 2019-07-09 LAB — BASIC METABOLIC PANEL
Anion Gap: 18 — ABNORMAL HIGH (ref 5.0–15.0)
Anion Gap: 17 — ABNORMAL HIGH (ref 5.0–15.0)
BUN: 35 mg/dL — ABNORMAL HIGH (ref 9.0–28.0)
BUN: 39 mg/dL — ABNORMAL HIGH (ref 9.0–28.0)
CO2: 18 mEq/L — ABNORMAL LOW (ref 22–29)
CO2: 23 mEq/L (ref 22–29)
Calcium: 10.1 mg/dL (ref 8.5–10.5)
Calcium: 10.5 mg/dL (ref 8.5–10.5)
Chloride: 104 mEq/L (ref 100–111)
Chloride: 97 mEq/L — ABNORMAL LOW (ref 100–111)
Creatinine: 4.6 mg/dL — ABNORMAL HIGH (ref 0.7–1.3)
Creatinine: 2.7 mg/dL — ABNORMAL HIGH (ref 0.7–1.3)
Glucose: 114 mg/dL — ABNORMAL HIGH (ref 70–100)
Glucose: 99 mg/dL (ref 70–100)
Potassium: 4.2 mEq/L (ref 3.5–5.1)
Potassium: 4.7 mEq/L (ref 3.5–5.1)
Sodium: 140 mEq/L (ref 136–145)
Sodium: 137 mEq/L (ref 136–145)

## 2019-07-09 LAB — PTH, INTACT: PTH Intact: 141.2 pg/mL — ABNORMAL HIGH (ref 9.0–72.0)

## 2019-07-09 LAB — CBC AND DIFFERENTIAL
Absolute NRBC: 0 10*3/uL (ref 0.00–0.00)
Basophils Absolute Automated: 0.03 10*3/uL (ref 0.00–0.08)
Basophils Automated: 0.2 %
Eosinophils Absolute Automated: 0 10*3/uL (ref 0.00–0.44)
Eosinophils Automated: 0 %
Hematocrit: 54.1 % — ABNORMAL HIGH (ref 37.6–49.6)
Hgb: 18.3 g/dL — ABNORMAL HIGH (ref 12.5–17.1)
Immature Granulocytes Absolute: 0.07 10*3/uL (ref 0.00–0.07)
Immature Granulocytes: 0.4 %
Lymphocytes Absolute Automated: 2.02 10*3/uL (ref 0.42–3.22)
Lymphocytes Automated: 10.5 %
MCH: 30.4 pg (ref 25.1–33.5)
MCHC: 33.8 g/dL (ref 31.5–35.8)
MCV: 89.9 fL (ref 78.0–96.0)
MPV: 11.5 fL (ref 8.9–12.5)
Monocytes Absolute Automated: 0.93 10*3/uL — ABNORMAL HIGH (ref 0.21–0.85)
Monocytes: 4.8 %
Neutrophils Absolute: 16.22 10*3/uL — ABNORMAL HIGH (ref 1.10–6.33)
Neutrophils: 84.1 %
Nucleated RBC: 0 /100 WBC (ref 0.0–0.0)
Platelets: 286 10*3/uL (ref 142–346)
RBC: 6.02 10*6/uL — ABNORMAL HIGH (ref 4.20–5.90)
RDW: 14 % (ref 11–15)
WBC: 19.27 10*3/uL — ABNORMAL HIGH (ref 3.10–9.50)

## 2019-07-09 LAB — URINALYSIS REFLEX TO MICROSCOPIC EXAM - REFLEX TO CULTURE
Bilirubin, UA: NEGATIVE
Glucose, UA: NEGATIVE
Ketones UA: NEGATIVE
Leukocyte Esterase, UA: NEGATIVE
Nitrite, UA: NEGATIVE
Protein, UR: >300 — AB
Specific Gravity UA: 1.026 (ref 1.001–1.035)
Urine pH: 5 (ref 5.0–8.0)
Urobilinogen, UA: NORMAL mg/dL (ref 0.2–2.0)

## 2019-07-09 LAB — C4 COMPLEMENT: C4 Complement: 28.8 mg/dL (ref 15.0–53.0)

## 2019-07-09 LAB — ECG 12-LEAD
Atrial Rate: 123 {beats}/min
P Axis: 86 degrees
P-R Interval: 112 ms
Q-T Interval: 324 ms
QRS Duration: 112 ms
QTC Calculation (Bezet): 463 ms
R Axis: 102 degrees
T Axis: 66 degrees
Ventricular Rate: 123 {beats}/min

## 2019-07-09 LAB — SEDIMENTATION RATE: Sed Rate: 4 mm/Hr (ref 0–15)

## 2019-07-09 LAB — BLOOD GAS, VENOUS
Base Excess, Ven: -3 mEq/L
HCO3, Ven: 22.2 mEq/L
O2 Sat, Venous: 98.6 %
Temperature: 37
Venous Total CO2: 23.5 mEq/L
pCO2, Venous: 42 mmHg
pH, Ven: 7.342
pO2, Venous: 171 mmHg

## 2019-07-09 LAB — HEPATITIS B CORE ANTIBODY, IGM: Hepatitis B Core IgM: NONREACTIVE

## 2019-07-09 LAB — HEPATITIS C ANTIBODY: Hepatitis C, AB: NONREACTIVE

## 2019-07-09 LAB — C3 COMPLEMENT: C3 Complement: 131 mg/dL (ref 82–185)

## 2019-07-09 LAB — HEPATITIS A ANTIBODY, IGM: Hep A IgM: NONREACTIVE

## 2019-07-09 LAB — LACTIC ACID, PLASMA: Lactic Acid: 1.2 mmol/L (ref 0.2–2.0)

## 2019-07-09 LAB — RAPID DRUG SCREEN, URINE
Barbiturate Screen, UR: NEGATIVE
Benzodiazepine Screen, UR: NEGATIVE
Cannabinoid Screen, UR: POSITIVE — AB
Cocaine, UR: NEGATIVE
Opiate Screen, UR: POSITIVE — AB
PCP Screen, UR: NEGATIVE
Urine Amphetamine Screen: NEGATIVE

## 2019-07-09 LAB — OSMOLALITY: Osmolality: 311 mosm/kg — ABNORMAL HIGH (ref 280–300)

## 2019-07-09 LAB — MAGNESIUM: Magnesium: 2.2 mg/dL (ref 1.6–2.6)

## 2019-07-09 LAB — C-REACTIVE PROTEIN: C-Reactive Protein: <0.1 mg/dL (ref 0.0–0.8)

## 2019-07-09 LAB — PHOSPHORUS: Phosphorus: 6.3 mg/dL — ABNORMAL HIGH (ref 2.3–4.7)

## 2019-07-09 LAB — LIPASE: Lipase: 70 U/L (ref 8–78)

## 2019-07-09 LAB — HIV AG/AB 4TH GENERATION: HIV Ag/Ab, 4th Generation: NONREACTIVE

## 2019-07-09 LAB — GFR
EGFR: 18.5
EGFR: 34.2

## 2019-07-09 LAB — SYPHILIS SCREEN IGG AND IGM: Syphilis Screen IgG and IgM: NONREACTIVE

## 2019-07-09 LAB — HEPATITIS B SURFACE ANTIGEN W/ REFLEX TO CONFIRMATION: Hepatitis B Surface Antigen: NONREACTIVE

## 2019-07-09 LAB — CK
Creatine Kinase (CK): 1263 U/L — ABNORMAL HIGH (ref 47–267)
Creatine Kinase (CK): 1297 U/L — ABNORMAL HIGH (ref 47–267)

## 2019-07-09 LAB — HEMOLYSIS INDEX: Hemolysis Index: 59 — ABNORMAL HIGH (ref 0–18)

## 2019-07-09 LAB — TSH: TSH: 0.37 u[IU]/mL (ref 0.35–4.94)

## 2019-07-09 MED ORDER — ONDANSETRON HCL 4 MG/2ML IJ SOLN
4.00 mg | Freq: Three times a day (TID) | INTRAMUSCULAR | Status: DC | PRN
Start: 2019-07-09 — End: 2019-07-10
  Administered 2019-07-09: 18:00:00 4 mg via INTRAVENOUS
  Filled 2019-07-09: qty 2

## 2019-07-09 MED ORDER — SERTRALINE HCL 50 MG PO TABS
25.0000 mg | ORAL_TABLET | Freq: Every day | ORAL | Status: DC
Start: 2019-07-09 — End: 2019-07-11
  Administered 2019-07-09 – 2019-07-11 (×3): 25 mg via ORAL
  Filled 2019-07-09 (×3): qty 1

## 2019-07-09 MED ORDER — PANTOPRAZOLE SODIUM 40 MG IV SOLR
40.00 mg | Freq: Every day | INTRAVENOUS | Status: DC
Start: 2019-07-09 — End: 2019-07-11
  Administered 2019-07-09 – 2019-07-11 (×3): 40 mg via INTRAVENOUS
  Filled 2019-07-09 (×3): qty 40

## 2019-07-09 MED ORDER — TRAZODONE HCL 50 MG PO TABS
50.0000 mg | ORAL_TABLET | Freq: Every evening | ORAL | Status: DC | PRN
Start: 2019-07-09 — End: 2019-07-11
  Administered 2019-07-09 – 2019-07-10 (×3): 50 mg via ORAL
  Filled 2019-07-09 (×3): qty 1

## 2019-07-09 MED ORDER — PROCHLORPERAZINE EDISYLATE 10 MG/2ML IJ SOLN
10.00 mg | Freq: Once | INTRAMUSCULAR | Status: AC
Start: 2019-07-09 — End: 2019-07-09
  Administered 2019-07-09: 22:00:00 10 mg via INTRAVENOUS
  Filled 2019-07-09: qty 2

## 2019-07-09 MED ORDER — ACETAMINOPHEN 325 MG PO TABS
650.0000 mg | ORAL_TABLET | ORAL | Status: DC | PRN
Start: 2019-07-09 — End: 2019-07-11
  Administered 2019-07-09 – 2019-07-10 (×2): 650 mg via ORAL
  Filled 2019-07-09 (×3): qty 2

## 2019-07-09 MED ORDER — HEPARIN SODIUM (PORCINE) 5000 UNIT/ML IJ SOLN
5000.00 [IU] | Freq: Three times a day (TID) | INTRAMUSCULAR | Status: DC
Start: 2019-07-09 — End: 2019-07-10
  Administered 2019-07-09 – 2019-07-10 (×5): 5000 [IU] via SUBCUTANEOUS
  Filled 2019-07-09 (×5): qty 1

## 2019-07-09 MED ORDER — NALOXONE HCL 0.4 MG/ML IJ SOLN (WRAP)
0.20 mg | INTRAMUSCULAR | Status: DC | PRN
Start: 2019-07-09 — End: 2019-07-11

## 2019-07-09 NOTE — Progress Notes (Signed)
MEDICINE PROGRESS NOTE    Date Time: 07/09/19 7:15 AM  Patient Name: Joel [name], Joel [name].  Attending Physician: Barbette Hair, MD    Assessment:   28 year old male with history of Traumatic brain injury, bipolar disorder, marijuana use, cannabis hyperemesis syndrome who presents with severe abdominal pain, nausea, and vomiting after cessation of marijuana use 5 days ago found to have tachypnea and tachycardia with signficant leukocytosis 19k, acute renale failure with Cr. 5.2 with anion gap metabolic acidosis (AG 22). New onset AKI in the setting of potential renal failure concerning for new onset nephrotic syndrome with protein >300 and Cr 5.2 with rule out rhabdomyolysis in the setting of elevated CK (>1400) . Patient also meets SIRS criteria without obvious source of infection and afebrile with leukocytosis potentially secondary to hemoconcentration. Patient is currently hemodynamically stable on IV fluid resustiation.     Plan:   Abdominal pain, nausea, and vomiting: Most likely consistent with acute hyperemesis syndrome in the setting of of recent marijuana use and history vs viral/bacterial colitis in the setting of leukocytosis however negative CT abdomen and lack of fever make an infectious process less likely.   -CT Abdomen/Pelvis performed showing no evidence of obstruction, colitis, or other GI pathology   -Continue IV fluids (plasmalyte 125 cc/hr)     #Acute kidney injury: Concerning for nephrotic syndrome (etiology unclear FSGS vs membranous vs minimal change disease) with protein >300 in UA and markedly elevated Cr (5.2) vs rhabdomysis in the setting of elevated CK.    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 Lytle Michaels     Safety Checklist:     DVT prophylaxis:  CHEST guideline (See page [id number]) Chemical   Foley:  Dane Rn Foley protocol Not present   IVs:  Peripheral IV   PT/OT: Not needed   Daily CBC & or Chem ordered:  SHM/ABIM guidelines (see #5) Yes, due to clinical and lab instability   Reference for approximate charges of common labs: CBC auto diff - $76   BMP - $99   Mg - $79    Lines:     Patient Lines/Drains/Airways Status    Active PICC Line / CVC Line / PIV Line / Drain / Airway / Intraosseous Line / Epidural Line / ART Line / Line / Wound / Pressure Ulcer / NG/OG Tube     Name:   Placement date:   Placement time:   Site:   Days:    Peripheral IV 07/08/19 18 G Right Forearm   07/08/19    2208    Forearm   less than 1                 Disposition: (Please see PAF column for Expected D/C Date)   Today's date: 07/09/2019  Admit Date: 07/08/2019  9:26 PM   LOS: 1    Anticipated discharge needs: TBD      Subjective     CC: Acute kidney failure    Interval History/24 hour events: No acute events after admission    HPI/Subjective: Endorses abdominal pain that is diffuse throughout the peri-umbilical region. Endorsees some mild nausea. Denies fevers and chills. Reports one episode of liquid diarrhea without blood. Has experienced mild chest pain on deep inhalation. Denies shortness of breath. Recent travel to [place]. Denies IVDU.     Review of Systems:     As per HPI    Physical Exam:     VITAL SIGNS PHYSICAL EXAM   Temp:  [97.3 F (36.3 C)] 97.3 F (36.3 C)  Heart Rate:  [74-148] 74  Resp Rate:  [12-31] 22  BP: (91-142)/(62-81)  91/62        Intake/Output Summary (Last 24 hours) at 07/09/2019 0715  Last data filed at 07/09/2019 0400  Gross per 24 hour   Intake 600 ml   Output 220 ml   Net 380 ml    Physical Exam  General: awake, alert and able to answer questions appropriately   Cardiovascular: regular rate and rhythm, no murmurs, rubs or gallops  Lungs: clear to auscultation bilaterally, without wheezing, rhonchi, or rales  Abdomen: soft, tender to palpation in lower left and right quadrant, non-distended; no palpable masses,  normoactive bowel sounds  Extremities: no edema. With small 2x2 cm areas of flat, white, and well circumscribed lesion on the sole of right foot and midfoot of left foot  GU: 0.5x0.5 cm pedunculated lesion at base of penis        Meds:     Medications were reviewed:  Current Facility-Administered Medications   Medication Dose Route Frequency    heparin (porcine)  5,000 Units Subcutaneous Q8H SCH    pantoprazole  40 mg Intravenous Daily    sertraline  25 mg Oral Daily    sodium chloride  1,000 mL Intravenous Once     Current Facility-Administered Medications   Medication Dose Route Frequency Last Rate    Plasma-Lyte A   Intravenous Continuous 75 mL/hr at 07/09/19 0151     Current Facility-Administered Medications   Medication Dose Route    acetaminophen  650 mg Oral    naloxone  0.2 mg Intravenous    ondansetron  4 mg Intravenous    traZODone  50 mg Oral         Labs:     Labs (last 72 hours):    Recent Labs   Lab 07/09/19  0143 07/08/19  2212   WBC 19.27* 19.75*   Hgb 18.3* 19.8*   Hematocrit 54.1* 56.6*   Platelets 286 330          Recent Labs   Lab 07/09/19  0104 07/08/19  2212   Sodium 140 138   Potassium 4.2 4.8   Chloride 104 98*   CO2 18* 18*   BUN 35.0* 34.0*   Creatinine 4.6* 5.2*   Calcium 10.1 12.0*   Albumin  --  6.3*   Protein, Total  --  10.6*   Bilirubin, Total  --  1.2   Alkaline Phosphatase  --  80   ALT  --  33   AST (SGOT)  --  38*   Glucose 114* 110*                   Microbiology, reviewed  and are significant for:  Microbiology Results     Procedure Component Value Units Date/Time    CULTURE BLOOD AEROBIC AND ANAEROBIC [[id number]] Collected: 07/09/19 0104    Specimen: Blood, Venipuncture Updated: 07/09/19 0256    Narrative:      The order will result in two separate 8-36ml bottles  Please do NOT order repeat blood cultures if one has been  drawn within the last 48 hours  UNLESS concerned for  endocarditis  AVOID BLOOD CULTURE DRAWS FROM CENTRAL LINE IF POSSIBLE  Indications:->Sepsis  1 BLUE+1 PURPLE    CULTURE BLOOD AEROBIC AND ANAEROBIC [[id number]] Collected: 07/09/19 0125    Specimen: Blood, Venipuncture Updated: 07/09/19 0256    Narrative:      The order will result in two separate 8-24ml bottles  Please do NOT order repeat blood cultures if one has been  drawn within the last 48 hours  UNLESS concerned for  endocarditis  AVOID BLOOD CULTURE DRAWS FROM CENTRAL LINE IF POSSIBLE  Indications:->Sepsis  1 BLUE+1 PURPLE          Imaging, reviewed and are significant for:  Ct Abd/ Pelvis Without Contrast    Result Date: 07/08/2019   CT scan of the abdomen and pelvis shows no acute abnormality. The examination shows no definite explanation for the reported symptoms. Note the limitations of this study which was performed without contrast. There is no evidence of bowel obstruction. No renal or ureteral calculi are seen and there is no evidence of obstructive uropathy. Miguel Dibble, MD  07/08/2019 11:15 PM    Xr Chest 2 Views    Result Date: 07/09/2019   No acute cardiopulmonary abnormality. Eloise Harman, MD  07/09/2019 1:43 AM        Signed by: Alanson Puls, MD

## 2019-07-09 NOTE — Progress Notes (Signed)
NUTRITION:  Reason for Assessment:   MST 3    Nutrition Intervention/Recommendations:  -Recommend to continue full liquid diet and advance to regular diet as tolerated and medically appropriate.  -Recommend oral nutrition supplements to increase caloric intake, will add Ensure Enlive TID.  -Pt very anxious during nutrition assessment and was distracted by desire to shower, Psychology consult may be appropriate.    Clinical / Nutr-Related History:  Joel [name]. is a(n) 28 y.o. male history of TBI, Bipolar Disorder, Marijuana Use, Cannabis Hyperemesis Syndrome who presents with severe abdominal pain, nausea, and vomiting after cessation of marijuana use 5 days ago.  Presenting in acute renal failure of unclear etiology.   -Pt reports 50% decrease of PO intake for the past 2 weeks and only eating some Ramen noodles and small pieces of pizza, drinking sips of fruit punch and sweet ice tea.   -He reports his pants fit looser around the hips and his usual body weight is ~145 lbs (65.8 kg).    Labs: glu 114, BUN 35.0 (H), creatinine 4.6 (H), Na 140, K 4.2, eGFR 18.5, mag 2.2, phos 6.3 (H)  Meds: heparin, pantoprazole, zoloft  GI: last BM 11/8; Pt ate liquid breakfast this morning and felt good appetite but had small emesis afterward    Physical Assessment:  (muslce mass/body fat depletion)  -Face/head - no temporalis muscle wasting, mild loss of fat pads around eyes  -Upper extremities - mild protruding acromion processes visible through t-shirt    Anthropometrics:  Height: 175.3 cm (5' 9.02")  Weight: 65.8 kg (145 lb) (stated usual body weight)  Body mass index is 21.4 kg/m.  *RDN brought standing scale into Pt's room today, standing weight: 55.6 kg (122.3 lbs).    -New BMI calculated: 18.1 kg/m (underweight).  -Weight loss of ~10.2 kg (22.5 lbs) equals ~15% x 2 weeks (severe).    Nutrition Diagnosis:  Acute, severe malnutrition related to abdominal pain, nausea, and vomiting as evidenced by reduced oral intake  of 50% x 5 days and unintentional weight loss >2% x 1 week.    Current Diet Order:   Full Liquid diet    Estimated Nutrition Needs:   1950-2200 kcals (35-40 kcal/kg), 67-83 g protein (1.2-1.5 g/kg), fluids per medical team    Monitor/Evaluation:   -PO intake/nutrition support goals, clinical progress, labs, GI, med/surgical tx plan    Geronimo Running, RDN, CDE  Spectra [phone]

## 2019-07-09 NOTE — Progress Notes (Addendum)
Shift Note  Vitals: VSS  Neuro: A/Ox4, denies h/a, dizziness, numbness and tingling  CV: On tele, NSR/sinus tachy, denies chest pain  Respiratory: On RA, denies SOB  GI/GU: Regular diet, denies nausea, LBM: 11/8  Integumentary: Tattoos on arms and chest, calluses on bilateral hands and feet  MSK: Full ROM  Pain: Denies pain at this time  Lines/Drips: 18g R FA, infusing plasma-lyte A  Ambulation: Up ad lib, standby assist  Fall Score: Moderate fall risk - fall mat placed, bed alarm on, bed in lowest position and locked, call bell and personal belongings within reach  Comments/Plan: No significant shift events. Plan is to trend labs, continue fluids, and nephrology consult in AM. Pt states that his mom will visit at 1PM. Will continue to monitor.    Update:  Devoria Albe from the lab to verify which tubes to send for Ca ionized (gold) and venous blood gas (light green)
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
## 2019-07-09 NOTE — Progress Notes (Signed)
Attending Attestation:     I have seen and personally examined the patient.  I agree with the findings and exam as documented by the resident with following caveats:     Hospital course:  This is a 28 year old male with a history significant for TBI, bipolar disorder, marijuana abuse, subsequent cannabis hyperemesis syndrome presenting with abdominal pain nausea and vomiting.  He has not used marijuana for 5 days as he was feeling nauseated and having increased vomiting.  He presented to the emergency room tachypneic, tachycardic, with a leukocytosis and acute renal failure with a creatinine of 5.2.  Though patient met SIRS criteria sepsis was not ruled in as there was no obvious source.  Possible that the nausea and vomiting was actually from renal failure as it is not as likely that he would have the significant of renal failure with just a few days of nausea and vomiting with significant proteinuria.  He does have an uncle that was placed on dialysis in his 88s but he is not sure why.  He thinks that he himself may have had some sort of renal insufficiency in the past but is unable to articulate any more about it.  Patient CK was also elevated in the thousands.  Is it possible that this is a downtrending CK from which over a week ago it was much higher and resulted in renal failure?  Regardless, given his age he will require work-up and most likely require a biopsy.  His creatinine has improved a small amount with IV fluids.  He is reporting very little urine output.  We have discussed with the nurse we will monitor strict I's and O's and make sure that he is making adequate urine.  We will also consult nephrology.  We will order a renal ultrasound, HIV, autoimmune panel.  Though patient uses marijuana he adamantly denies use of any other drugs he does not use synthetic marijuana either.    Barbette Hair, MD

## 2019-07-09 NOTE — UM Notes (Signed)
Initial Inpatient  Review     09/06/18 2302  Adult Admit to Inpatient ([facility] Only) Once    Diagnosis: Acute Kidney Failure    Level of Care: Acute    Patient Class: Inpatient               Admitting Physician Kerby Moors K    Service: Medicine    Estimated Length of Stay 3 - 5 Days    Tentative Discharge Plan? Home or Self Care    Does patient need telemetry? Yes    Telemetry type (separate Telemetry order is also required): Medical telemetry            PMH: TBI, Bipolar Disorder, Marijuana Use, Cannabis Hyperemesis Syndrome     Presented to ED  With diffuse abdominal pain, nausea and vomiting    VS Pulse (!) 148   BP 142/67   Resp 15   SpO2 98 %   Temp 97.3 F (36.3 C)    Medication given in ED  Haldol IV, Ativan IV, Morphine IV, IVF bolus x 2 L    LABS:       07/08/2019 22:12   WBC  19.75 (H)   Hemoglobin  19.8 (H)   Hematocrit  56.6 (H)   Platelet Count  330   RBC  6.39 (H)     Glucose  110 (H)   BUN  34.0 (H)   Creatinine  5.2 (H)   Sodium  138   Potassium  4.8   Chloride  98 (L)   CO2  18 (L)   Calcium  12.0 (H)   Anion Gap  22.0 (H)   EGFR  16.1   Osmolality  311 (H)     CT Scan no acute abnormality   CXR no acute cardiopulmonary abnormality     Admit to Floor     Assessment   #Acute Renal Failure, 2/2 prerenal 2/2 severe dehydration vs other intrinsic renal cause  #SIRS, r/o Sepsis  #Abdominal Pain/ Nausea/ Vomiting 2/2 marijuana cessation?  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  #History of Cannabis Hyperemesis Syndrome  #Bipolar Disorder  #History of TBI with behavioral changes  #History of Polysubstance Abuse- currently only using marijuana- quit 5 days ago    Plan:     -Trend BMP/Cr, Check UA with Microscopy, Urine Lytes, Urine/ Serum Osmolality to assess for osmolar gap- likely degree of dehydration/hypovolemia contributing to renal failure, however, will consider  some other intrinsic renal cause given degree of injury; pending UA- can consider checking complements, auto-immune markers, ANCAs, immunofixation, light chains, biopsy  -Continue IV Fluids for now- plasmalyte 75 cc/hr, can consider addition of bicarb if pH is significantly decreased <7.2  -Nephro Consult in AM  -Renal US   -Follow Up Infectious Workup- BCx, UA, C-lactate wnl  -hold off on ABx for now given no clear source of infection  -Continue home sertraline, trazodone      Current Facility-Administered Medications   Medication Dose Route Frequency    heparin (porcine)  5,000 Units Subcutaneous Q8H SCH    pantoprazole  40 mg Intravenous Daily    sertraline  25 mg Oral Daily    sodium chloride  1,000 mL Intravenous Once     Current Facility-Administered Medications   Medication Dose Route Frequency Last Rate    Plasma-Lyte A   Intravenous Continuous 75 mL/hr at 07/09/19 0151  Current Facility-Administered Medications   Medication Dose Route    acetaminophen  650 mg Oral    naloxone  0.2 mg Intravenous    ondansetron  4 mg Intravenous    traZODone  50 mg Oral     Ashok Pall RN/ACM  Utilization Review  [facility]  [address]  [place] [address]  Phone: [phone] (voicemail only)   Fax: [phone]  Email: [email or web address]  NPI: [id number]  Tax ID: [id number]

## 2019-07-09 NOTE — Malnutrition Assessment (Signed)
Joel [name]. is a 28 y.o. male patient.   [id number]      Malnutrition Documentation:  Acute, severe malnutrition related to abdominal pain, nausea, and vomiting as evidenced by reduced oral intake of 50% x 5 days and unintentional weight loss >2% x 1 week.    If physician disagrees with this assessment see addendum.    Geronimo Running, RDN, CDE  Spectra [phone]

## 2019-07-09 NOTE — Progress Notes (Signed)
Pt requested to speak with MD. This RN called resident, who confirmed that someone will come and speak to pt. Bedside RN and pt made aware.

## 2019-07-09 NOTE — Plan of Care (Addendum)
Patient is A/O x 4, anxious patient states he takes frequent showers to help him calm himself down and destress, Cr 4.6 levels is decreasing, fluids increased from 75-125, pt remains and strict I&O's, 1 episode of nausea with vomiting after breakfast, pt states he felt better vomiting, Neph consult pending, family would like to be kept updated with POC     Addendum 1230: Patient appears to be actively withdrawing, he is very anxious, cannot settle or relax himself for longer than 10-15 mins, patient states he needs to keep showering to calm himself down, and plans to shower Q2H, patient is currently on shower # 4     Problem: Safety  Goal: Patient will be free from injury during hospitalization  Outcome: Progressing  Flowsheets (Taken 07/09/2019 1102)  Patient will be free from injury during hospitalization:   Assess patient's risk for falls and implement fall prevention plan of care per policy   Use appropriate transfer methods   Include patient/ family/ care giver in decisions related to safety   Ensure appropriate safety devices are available at the bedside   Provide and maintain safe environment     Problem: Pain  Goal: Pain at adequate level as identified by patient  Outcome: Progressing  Flowsheets (Taken 07/09/2019 1102)  Pain at adequate level as identified by patient:   Identify patient comfort function goal   Assess for risk of opioid induced respiratory depression, including snoring/sleep apnea. Alert healthcare team of risk factors identified.   Assess pain on admission, during daily assessment and/or before any "as needed" intervention(s)   Reassess pain within 30-60 minutes of any procedure/intervention, per Pain Assessment, Intervention, Reassessment (AIR) Cycle   Evaluate patient's satisfaction with pain management progress   Evaluate if patient comfort function goal is met     Problem: Discharge Barriers  Goal: Patient will be discharged home or other facility with appropriate  resources  Outcome: Progressing  Flowsheets (Taken 07/09/2019 1102)  Discharge to home or other facility with appropriate resources:   Provide appropriate patient education   Provide information on available health resources   Initiate discharge planning

## 2019-07-10 DIAGNOSIS — Z5189 Encounter for other specified aftercare: Secondary | ICD-10-CM

## 2019-07-10 LAB — BASIC METABOLIC PANEL
Anion Gap: 19 — ABNORMAL HIGH (ref 5.0–15.0)
Anion Gap: 16 — ABNORMAL HIGH (ref 5.0–15.0)
BUN: 45 mg/dL — ABNORMAL HIGH (ref 9.0–28.0)
BUN: 44 mg/dL — ABNORMAL HIGH (ref 9.0–28.0)
CO2: 19 mEq/L — ABNORMAL LOW (ref 22–29)
CO2: 22 mEq/L (ref 22–29)
Calcium: 10.3 mg/dL (ref 8.5–10.5)
Calcium: 10.3 mg/dL (ref 8.5–10.5)
Chloride: 95 mEq/L — ABNORMAL LOW (ref 100–111)
Chloride: 91 mEq/L — ABNORMAL LOW (ref 100–111)
Creatinine: 2.6 mg/dL — ABNORMAL HIGH (ref 0.7–1.3)
Creatinine: 2 mg/dL — ABNORMAL HIGH (ref 0.7–1.3)
Glucose: 105 mg/dL — ABNORMAL HIGH (ref 70–100)
Glucose: 118 mg/dL — ABNORMAL HIGH (ref 70–100)
Potassium: 3.9 mEq/L (ref 3.5–5.1)
Potassium: 3.8 mEq/L (ref 3.5–5.1)
Sodium: 133 mEq/L — ABNORMAL LOW (ref 136–145)
Sodium: 129 mEq/L — ABNORMAL LOW (ref 136–145)

## 2019-07-10 LAB — ECG 12-LEAD
Atrial Rate: 108 {beats}/min
P Axis: 84 degrees
P-R Interval: 118 ms
Q-T Interval: 400 ms
QRS Duration: 114 ms
QTC Calculation (Bezet): 536 ms
R Axis: 98 degrees
T Axis: 62 degrees
Ventricular Rate: 108 {beats}/min

## 2019-07-10 LAB — CBC WITH MANUAL DIFFERENTIAL
Absolute NRBC: 0 10*3/uL (ref 0.00–0.00)
Atypical Lymphocytes %: 2 %
Atypical Lymphocytes Absolute: 0.29 10*3/uL — ABNORMAL HIGH (ref 0.00–0.00)
Band Neutrophils Absolute: 0 10*3/uL (ref 0.00–1.00)
Band Neutrophils: 0 %
Basophils Absolute Manual: 0 10*3/uL (ref 0.00–0.08)
Basophils Manual: 0 %
Cell Morphology: NORMAL
Eosinophils Absolute Manual: 0 10*3/uL (ref 0.00–0.44)
Eosinophils Manual: 0 %
Hematocrit: 55.5 % — ABNORMAL HIGH (ref 37.6–49.6)
Hgb: 18.9 g/dL — ABNORMAL HIGH (ref 12.5–17.1)
Lymphocytes Absolute Manual: 2.03 10*3/uL (ref 0.42–3.22)
Lymphocytes Manual: 14 %
MCH: 30.8 pg (ref 25.1–33.5)
MCHC: 34.1 g/dL (ref 31.5–35.8)
MCV: 90.5 fL (ref 78.0–96.0)
MPV: 11.4 fL (ref 8.9–12.5)
Monocytes Absolute: 0.72 10*3/uL (ref 0.21–0.85)
Monocytes Manual: 5 %
Neutrophils Absolute Manual: 11.44 10*3/uL — ABNORMAL HIGH (ref 1.10–6.33)
Nucleated RBC: 0 /100 WBC (ref 0.0–0.0)
Platelet Estimate: NORMAL
Platelets: 245 10*3/uL (ref 142–346)
RBC: 6.13 10*6/uL — ABNORMAL HIGH (ref 4.20–5.90)
RDW: 13 % (ref 11–15)
Segmented Neutrophils: 79 %
WBC: 14.48 10*3/uL — ABNORMAL HIGH (ref 3.10–9.50)

## 2019-07-10 LAB — GFR
EGFR: 35.7
EGFR: 48.4

## 2019-07-10 LAB — CK
Creatine Kinase (CK): 1047 U/L — ABNORMAL HIGH (ref 47–267)
Creatine Kinase (CK): 807 U/L — ABNORMAL HIGH (ref 47–267)

## 2019-07-10 LAB — IFE REVIEW, SERUM

## 2019-07-10 LAB — PROTEIN / CREATININE RATIO, URINE
Urine Creatinine, Random: 406 mg/dL
Urine Protein Random: 156.4 mg/dL — ABNORMAL HIGH (ref 1.0–14.0)
Urine Protein/Creatinine Ratio: 0.4

## 2019-07-10 LAB — IMMUNOFIXATION ELECTROPHORESIS

## 2019-07-10 MED ORDER — HEPARIN SODIUM (PORCINE) 5000 UNIT/ML IJ SOLN
5000.00 [IU] | Freq: Three times a day (TID) | INTRAMUSCULAR | Status: AC
Start: 2019-07-10 — End: 2019-07-10
  Administered 2019-07-10: 23:00:00 5000 [IU] via SUBCUTANEOUS
  Filled 2019-07-10: qty 1

## 2019-07-10 MED ORDER — PLASMA-LYTE A IV INFUSION
INTRAVENOUS | Status: AC
Start: 2019-07-10 — End: 2019-07-11

## 2019-07-10 MED ORDER — ONDANSETRON HCL 4 MG/2ML IJ SOLN
4.00 mg | Freq: Three times a day (TID) | INTRAMUSCULAR | Status: DC | PRN
Start: 2019-07-10 — End: 2019-07-11
  Administered 2019-07-10 (×3): 4 mg via INTRAVENOUS
  Filled 2019-07-10 (×3): qty 2

## 2019-07-10 NOTE — Progress Notes (Signed)
Patient A&Ox4, VSS  Frequent showering  Nausea no vomiting    Strict I&Os  Labs Q12   IV infiltrated, will place new one   Bed low

## 2019-07-10 NOTE — Progress Notes (Signed)
SHIFT NOTE  Admitted for N/V/abd pain and AKI-PMH of cannabis hyperemesis syndrome  Major Shift Events:  none     General: A/O x 4-VSS with elevated BP  Rhythm on tele: n/a  Ambulation: independant  Pain: abd-zofran & compazine given with some good effect  Lines/Drips: PIV FA    GI/GU: voiding dimished-no BM this shift-reports intermittent emesis  Skin: intact-tattoos to torso  Fall Score: MOD  Does the patient require an Interpreter? No.  Continue to shower independently to sooth symptoms.  Safety Precautions in place.  Will continue to monitor.

## 2019-07-10 NOTE — UM Notes (Signed)
CSR 11/10     Assessment and Plan:     Per Nephrology Note   Assessment:     1) AKI - volume depletion from nausea, vomiting, poor PO intake  -may have progressed to ATN  -Improved  2) Proteinuria - mild. ?nephritis. Upro:Cr 400 mg  -only small blood on UA, which accountable by mild rhabdo  -HIV and Hep B, C serologies negative  3) Mild rhabdo  4) Acidosis -due to AKI  5) Hypercalcemia  6) Leukocytosis  7) Erythrocytosis    Recommendations:     1) Hold off on renal biopsy for now; however, would make NPO for possible biopsy tomorrow pending results of BMP ( repeat) and other serologies  2) ANA and ANCA screen  3) Monitor BP  4) Check 25 hydroxy Vitamin D level  5) Repeat UA  6) Check urine Na, Cl    VS 97.9 HR 95 RR 19 BP 157/96     LABS:    07/10/2019 04:27   Glucose  105 (H)   BUN  45.0 (H)   Creatinine  2.6 (H)   Sodium  133 (L)   Potassium  3.9   Chloride  95 (L)   CO2  19 (L)   Calcium  10.3   Anion Gap  19.0 (H)           Creatine Kinase (CK)   1,047 (H)       07/10/2019 04:27   WBC  14.48 (H)   Hemoglobin  18.9 (H)   Hematocrit  55.5 (H)   Platelet   245   RBC  6.13 (H)     Current Facility-Administered Medications   Medication Dose Route Frequency    heparin (porcine)  5,000 Units Subcutaneous Q8H SCH    pantoprazole  40 mg Intravenous Daily    sertraline  25 mg Oral Daily    sodium chloride  1,000 mL Intravenous Once     Current Facility-Administered Medications   Medication Dose Route Frequency Last Rate    Plasma-Lyte A   Intravenous Continuous       Current Facility-Administered Medications   Medication Dose Route    acetaminophen  650 mg Oral    naloxone  0.2 mg Intravenous    ondansetron  4 mg Intravenous    traZODone  50 mg Oral       Ashok Pall RN/ACM  Utilization Review  [facility]  [address]  [place] [address]  Phone: [phone] (voicemail only)   Fax: [phone]  Email: [email or web address]  NPI: [id number]  Tax ID: [id number]

## 2019-07-10 NOTE — Plan of Care (Addendum)
Brought to my attention that patient's nausea is outlasting the Zofran. On chart review, noticed patients QTc prolongation. Would recommend against extensive use of Zofran, alternative would be off-label use of Ativan for nausea. At this time, have resorted to waiting and seeing how patient responds to his Zofran and Tramadol. If needed, will prescribe 1.0 mg Ativan, for patients nausea/ dry heaves.

## 2019-07-10 NOTE — Progress Notes (Signed)
MEDICINE PROGRESS NOTE    Date Time: 07/10/19 6:47 PM  Patient Name: Joel [name], Joel [name].  Attending Physician: Barbette Hair, MD    Assessment:   28 year old male with history of Traumatic brain injury, bipolar disorder, marijuana use, cannabis hyperemesis syndrome who presents with severe abdominal pain, nausea, and vomiting after cessation of marijuana use 5 days ago found to have tachypnea and tachycardia with signficant leukocytosis 19k, acute renale failure with Cr. 5.2 with anion gap metabolic acidosis (AG 22). In the setting of urine protein/creatinine ratio of 0.4 with continued improvement of creatinine in setting of aggressive rehydration more consistent with rhabdomylsis. Patient is currently hemodynamically stable on IV fluid resustiation.     Plan:   Abdominal pain, nausea, and vomiting: Most likely consistent with acute hyperemesis syndrome in the setting of of recent marijuana use and history vs viral/bacterial colitis in the setting of leukocytosis however negative CT abdomen and lack of fever make an infectious process less likely.   -CT Abdomen/Pelvis performed showing no evidence of obstruction, colitis, or other GI pathology   -Continue IV fluids (plasmalyte 125 cc/hr)     #Acute kidney injury: Concerning for rhabdomysis in the setting of elevated CK.   Continued rule out for nephrotic syndrome (etiology unclear FSGS vs membranous vs minimal change disease) with protein >300 in UA and markedly elevated Cr (5.2).   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 Lytle Michaels     Safety Checklist:     DVT prophylaxis:  CHEST guideline (See page [id number]) Chemical   Foley:  Brigantine Rn Foley protocol Not present   IVs:  Peripheral IV   PT/OT: Not needed   Daily CBC & or Chem ordered:  SHM/ABIM guidelines (see #5) Yes, due to clinical and lab instability   Reference for approximate charges of common labs: CBC auto diff - $76   BMP - $99   Mg - $79    Lines:     Patient Lines/Drains/Airways Status    Active PICC Line / CVC Line / PIV Line / Drain / Airway / Intraosseous Line / Epidural Line / ART Line / Line / Wound / Pressure Ulcer / NG/OG Tube     Name:   Placement date:   Placement time:   Site:   Days:    Peripheral IV 07/08/19 18 G Right Forearm   07/08/19    2208    Forearm   less than 1                 Disposition: (Please see PAF column for Expected D/C Date)   Today's date: 07/10/2019   Admit Date: 07/08/2019  9:26 PM   LOS: 2  Anticipated discharge needs: TBD      Subjective     CC: Acute kidney failure    Interval History/24 hour events: No acute events after admission    HPI/Subjective: Endorses abdominal pain that is diffuse throughout the peri-umbilical region. Endorsees nausea and multiple episodes of emesis. Denies chest pain and shortness of breath. Requires continued showers for symptom management.     Review of Systems:     As per HPI    Physical Exam:     VITAL SIGNS PHYSICAL EXAM   Temp:  [97.9 F (36.6 C)] 97.9 F (36.6 C)  Heart Rate:  [76-122] 102  Resp Rate:  [19-20] 20  BP: (115-157)/(76-96) 139/90        Intake/Output Summary (Last 24 hours) at 07/10/2019 1847  Last data filed at 07/10/2019 1600  Gross per 24  hour   Intake 100 ml   Output 440 ml   Net -340 ml    Physical Exam  General: awake, alert and able to answer questions appropriately   Cardiovascular: regular rate and rhythm, no murmurs, rubs or gallops  Lungs: clear to auscultation bilaterally, without wheezing, rhonchi, or rales  Abdomen: soft, tender to palpation in lower left and right quadrant, non-distended; no palpable masses,  normoactive bowel sounds  Extremities: no edema. With small 2x2 cm areas of flat, white, and well circumscribed lesion on the sole of right foot and midfoot of left foot       Meds:     Medications were reviewed:  Current Facility-Administered Medications   Medication Dose Route Frequency    heparin (porcine)  5,000 Units Subcutaneous Q8H SCH    pantoprazole  40 mg Intravenous Daily    sertraline  25 mg Oral Daily    sodium chloride  1,000 mL Intravenous Once     Current Facility-Administered Medications   Medication Dose Route Frequency Last Rate    Plasma-Lyte A   Intravenous Continuous 125 mL/hr at 07/10/19 1451     Current Facility-Administered Medications   Medication Dose Route    acetaminophen  650 mg Oral    naloxone  0.2 mg Intravenous    ondansetron  4 mg Intravenous    traZODone  50 mg Oral         Labs:     Labs (last 72 hours):    Recent Labs   Lab 07/10/19  0427 07/09/19  0143   WBC 14.48* 19.27*   Hgb 18.9* 18.3*   Hematocrit 55.5* 54.1*   Platelets 245 286          Recent Labs   Lab 07/10/19  1421 07/10/19  0427  07/08/19  2212   Sodium 129* 133*  More results in Results Review 138   Potassium 3.8 3.9  More results in Results Review 4.8   Chloride 91* 95*  More results in Results Review 98*   CO2 22 19*  More results in Results Review 18*   BUN 44.0* 45.0*  More results in Results Review 34.0*   Creatinine 2.0* 2.6*  More results in Results Review 5.2*   Calcium 10.3 10.3  More results in Results Review 12.0*   Albumin  --   --   --  6.3*   Protein, Total  --   --   --  10.6*   Bilirubin, Total  --   --   --   1.2   Alkaline Phosphatase  --   --   --  80  ALT  --   --   --  33   AST (SGOT)  --   --   --  38*   Glucose 118* 105*  More results in Results Review 110*   More results in Results Review = values in this interval not displayed.                   Microbiology, reviewed and are significant for:  Microbiology Results     Procedure Component Value Units Date/Time    CULTURE BLOOD AEROBIC AND ANAEROBIC [[id number]] Collected: 07/09/19 0104    Specimen: Blood, Venipuncture Updated: 07/09/19 0256    Narrative:      The order will result in two separate 8-77ml bottles  Please do NOT order repeat blood cultures if one has been  drawn within the last 48 hours  UNLESS concerned for  endocarditis  AVOID BLOOD CULTURE DRAWS FROM CENTRAL LINE IF POSSIBLE  Indications:->Sepsis  1 BLUE+1 PURPLE    CULTURE BLOOD AEROBIC AND ANAEROBIC [[id number]] Collected: 07/09/19 0125    Specimen: Blood, Venipuncture Updated: 07/09/19 0256    Narrative:      The order will result in two separate 8-65ml bottles  Please do NOT order repeat blood cultures if one has been  drawn within the last 48 hours  UNLESS concerned for  endocarditis  AVOID BLOOD CULTURE DRAWS FROM CENTRAL LINE IF POSSIBLE  Indications:->Sepsis  1 BLUE+1 PURPLE          Imaging, reviewed and are significant for:  US Renal Kidney    Result Date: 07/10/2019  Mildly increased renal cortical echogenicity suggestive of medical renal disease. No hydronephrosis.  Eloise Harman, MD  07/10/2019 12:09 AM        Signed by: Alanson Puls, MD

## 2019-07-10 NOTE — Progress Notes (Signed)
Attending Attestation:     I have seen and personally examined the patient.  I agree with the findings and exam as documented by the resident with following caveats:     Hospital course:  This is a 28 year old male with a history significant for TBI, bipolar disorder, marijuana abuse, subsequent cannabis hyperemesis syndrome presenting with abdominal pain nausea and vomiting.  He has not used marijuana for 5 days as he was feeling nauseated and having increased vomiting.  He presented to the emergency room tachypneic, tachycardic, with a leukocytosis and acute renal failure with a creatinine of 5.2.  Though patient met SIRS criteria sepsis was not ruled in as there was no obvious source.  Possible that the nausea and vomiting was actually from renal failure as it is not as likely that he would have the significant of renal failure with just a few days of nausea and vomiting with significant proteinuria.  He does have an uncle that was placed on dialysis in his 25s but he is not sure why.  He thinks that he himself may have had some sort of renal insufficiency in the past but is unable to articulate any more about it.  Patient CK was also elevated in the thousands is trending down slowly now.   We will c/w IVF. He is reporting very little urine output still.  We have d/w renal and if his cr improves tomorrow and his urine output improves he will be dcd tomorrow if his nausea is improving.       Barbette Hair, MD

## 2019-07-10 NOTE — Progress Notes (Signed)
[facility] PROGRESS NOTE  Myrla Halsted, x [phone] [facility] Spectralink)      Date Time: 07/10/19 12:15 PM  Patient Name: Joel [name], Joel [name].  Attending Physician: Barbette Hair, MD    CC: follow-up AKI    Assessment:     1) AKI - volume depletion from nausea, vomiting, poor PO intake  -may have progressed to ATN  -Improved  2) Proteinuria - mild. ?nephritis. Upro:Cr 400 mg  -only small blood on UA, which accountable by mild rhabdo  -HIV and Hep B, C serologies negative  3) Mild rhabdo  4) Acidosis -due to AKI  5) Hypercalcemia  6) Leukocytosis  7) Erythrocytosis    Recommendations:     1) Hold off on renal biopsy for now; however, would make NPO for possible biopsy tomorrow pending results of BMP and other serologies  2) ANA and ANCA screen  3) Monitor BP  4) Check 25 hydroxy Vitamin D level  5) Repeat UA  6) Check urine Na, Cl      Case discussed with: medicine team    Glenard Haring, MD  [facility]  [phone] (office)  X [phone] (FFX [facility])      Subjective: Feels OK.  No current complaints     Review of Systems:   Review of Systems - no nausea, no abdominal pain, no CP      Physical Exam:     Vitals:    07/09/19 2100 07/10/19 0057 07/10/19 0419 07/10/19 0802   BP: (!) 146/95 133/77 115/76 125/84   Pulse: 76 (!) 122 (!) 106 (!) 104   Resp:  19 19 20    Temp:       TempSrc:       SpO2: 99% 99% 100% 99%   Weight:       Height:           Intake and Output Summary (Last 24 hours) at Date Time    Intake/Output Summary (Last 24 hours) at 07/10/2019 1215  Last data filed at 07/10/2019 1000  Gross per 24 hour   Intake 50 ml   Output 140 ml   Net -90 ml       General: awake, alert, oriented x 3, no acute distress.  Cardiovascular: regular rate and rhythm, no murmurs, rubs or gallops  Lungs: clear to auscultation bilaterally, without wheezing, rhonchi, or rales  Abdomen: soft, non-tender, non-distended, normoactive bowel sounds  Extremities: no LE edema  Back: No CVA tenderness  Skin: no  rash    Meds:      Scheduled Meds: PRN Meds:    heparin (porcine), 5,000 Units, Subcutaneous, Q8H SCH  pantoprazole, 40 mg, Intravenous, Daily  sertraline, 25 mg, Oral, Daily  sodium chloride, 1,000 mL, Intravenous, Once          Continuous Infusions:   Plasma-Lyte A      acetaminophen, 650 mg, Q4H PRN  naloxone, 0.2 mg, PRN  ondansetron, 4 mg, Q8H PRN  traZODone, 50 mg, QHS PRN              Labs:     Recent Labs   Lab 07/10/19  0427 07/09/19  0143 07/08/19  2212   WBC 14.48* 19.27* 19.75*   Hgb 18.9* 18.3* 19.8*   Hematocrit 55.5* 54.1* 56.6*   Platelets 245 286 330     Recent Labs   Lab 07/10/19  0427 07/09/19  1727 07/09/19  0104 07/08/19  2212   Sodium 133* 137 140 138  Potassium 3.9 4.7 4.2 4.8   Chloride 95* 97* 104 98*   CO2 19* 23 18* 18*   BUN 45.0* 39.0* 35.0* 34.0*   Creatinine 2.6* 2.7* 4.6* 5.2*   Calcium 10.3 10.5 10.1 12.0*   Albumin  --   --   --  6.3*   Phosphorus  --   --  6.3*  --    Magnesium  --   --  2.2  --    Glucose 105* 99 114* 110*   EGFR 35.7 34.2 18.5 16.1       Recent Labs   Lab 07/09/19  0425   Urine Type Urine, Clean Ca   Color, UA Amber*   Clarity, UA Cloudy*   Specific Gravity UA 1.026   Urine pH 5.0   Nitrite, UA Negative   Ketones UA Negative   Urobilinogen, UA Normal   Bilirubin, UA Negative   Blood, UA Small*   RBC, UA 0 - 2   WBC, UA 0 - 5     Recent Labs   Lab 07/10/19  0105   Urine Protein/Creatinine Ratio 0.4   Urine Creatinine, Random 406.0       Imaging personally reviewed, including: US Renal Kidney    Result Date: 07/10/2019  Mildly increased renal cortical echogenicity suggestive of medical renal disease. No hydronephrosis.  Eloise Harman, MD  07/10/2019 12:09 AM          Signed by: Glenard Haring, MD

## 2019-07-10 NOTE — Progress Notes (Addendum)
Discharge Planning---Case Management  S (Situation) - Pt is a 28 yo male with PMH significant for TBI, bipolar disorder, marijuana use,cannabis hyper emesis syndromeI, cannabis use disorder, presents with c/o TBI, AKF, nausea, vomiting x 5 days PTA.  Assessment completed with pt. Educated pt about CM role and discharge planning in hospital setting.      B (Background) - Verified address, PCP--Pt reports no PCP, given information for [facility] and [facility] MD search. .   Insurance: [place] Medicaid HMO  DME:  none    HH or rehab placement: none recently  Pt lives with family members in 2 level single family home, bedroom and bath upstairs, in  [place].      Independent with ADL/IADL's PTA, able to drive to attend appointments and do errands as needed. No issues with medication or MD copays reported at this time.      A (Assessment) - Pending medical treatment, anticipate d/c to home.      07/10/19 1246   Patient Type   Within 30 Days of Previous Admission? No   Healthcare Decisions   Interviewed: Patient   Orientation/Decision Making Abilities of Patient Alert and Oriented x3, able to make decisions   Prior to admission   Prior level of function Independent with ADLs;Ambulates independently   Type of Residence Private residence   Home Layout Two level   Have running water, electricity, heat, etc? Yes   Living Arrangements Parent;Family members   How do you get to your MD appointments? self   How do you get your groceries? self, mother   Who fixes your meals? mother,   Who does your laundry? mother   Who picks up your prescriptions? self, mother   Dressing Independent   Grooming Independent   Feeding Independent   Bathing Independent   Toileting Independent   Discharge Planning   Support Systems Parent;Family members   Patient expects to be discharged to: home   Anticipated [place] plan discussed with: Same as interviewed   Mode of transportation: Private car (family member)   Does the patient have perscription coverage? Yes    Consults/Providers   PT Evaluation Needed 2   OT Evalulation Needed 2   SLP Evaluation Needed 2   Outcome Palliative Care Screen Screened but did not meet criteria for intervention   Correct PCP listed in Epic? Yes   Family and PCP   In case you are admitted, would like family notified? Unknown   In case you are admitted, would like your PCP notified? Unknown   PCP on file was verified as the current PCP? Patient/family states they do not have a PCP   Important Message from Medicare Notice   Patient received 1st IMM Letter? n/a   R (Recommendation) - CM will continue to follow for [place] planning needs.   Dolores Frame, RN, BSN, CMII  [facility]-- Case Management  [phone]

## 2019-07-11 ENCOUNTER — Encounter (INDEPENDENT_AMBULATORY_CARE_PROVIDER_SITE_OTHER): Payer: Self-pay

## 2019-07-11 LAB — URINALYSIS, REFLEX TO MICROSCOPIC EXAM IF INDICATED
Bilirubin, UA: NEGATIVE
Blood, UA: NEGATIVE
Glucose, UA: NEGATIVE
Ketones UA: NEGATIVE
Leukocyte Esterase, UA: NEGATIVE
Nitrite, UA: NEGATIVE
Protein, UR: NEGATIVE
Specific Gravity UA: 1.016 (ref 1.001–1.035)
Urine pH: 6 (ref 5.0–8.0)
Urobilinogen, UA: NORMAL mg/dL (ref 0.2–2.0)

## 2019-07-11 LAB — GFR: EGFR: >60

## 2019-07-11 LAB — CBC AND DIFFERENTIAL
Absolute NRBC: 0 10*3/uL (ref 0.00–0.00)
Basophils Absolute Automated: 0.02 10*3/uL (ref 0.00–0.08)
Basophils Automated: 0.2 %
Eosinophils Absolute Automated: 0.01 10*3/uL (ref 0.00–0.44)
Eosinophils Automated: 0.1 %
Hematocrit: 53.8 % — ABNORMAL HIGH (ref 37.6–49.6)
Hgb: 19 g/dL — ABNORMAL HIGH (ref 12.5–17.1)
Immature Granulocytes Absolute: 0.03 10*3/uL (ref 0.00–0.07)
Immature Granulocytes: 0.3 %
Lymphocytes Absolute Automated: 1.52 10*3/uL (ref 0.42–3.22)
Lymphocytes Automated: 15 %
MCH: 30.9 pg (ref 25.1–33.5)
MCHC: 35.3 g/dL (ref 31.5–35.8)
MCV: 87.6 fL (ref 78.0–96.0)
MPV: 11.6 fL (ref 8.9–12.5)
Monocytes Absolute Automated: 0.55 10*3/uL (ref 0.21–0.85)
Monocytes: 5.4 %
Neutrophils Absolute: 8.02 10*3/uL — ABNORMAL HIGH (ref 1.10–6.33)
Neutrophils: 79 %
Nucleated RBC: 0 /100 WBC (ref 0.0–0.0)
Platelets: 268 10*3/uL (ref 142–346)
RBC: 6.14 10*6/uL — ABNORMAL HIGH (ref 4.20–5.90)
RDW: 13 % (ref 11–15)
WBC: 10.15 10*3/uL — ABNORMAL HIGH (ref 3.10–9.50)

## 2019-07-11 LAB — BASIC METABOLIC PANEL
Anion Gap: 14 (ref 5.0–15.0)
BUN: 34 mg/dL — ABNORMAL HIGH (ref 9.0–28.0)
CO2: 24 mEq/L (ref 22–29)
Calcium: 10.2 mg/dL (ref 8.5–10.5)
Chloride: 94 mEq/L — ABNORMAL LOW (ref 100–111)
Creatinine: 1.5 mg/dL — ABNORMAL HIGH (ref 0.7–1.3)
Glucose: 105 mg/dL — ABNORMAL HIGH (ref 70–100)
Potassium: 3.8 mEq/L (ref 3.5–5.1)
Sodium: 132 mEq/L — ABNORMAL LOW (ref 136–145)

## 2019-07-11 LAB — CALCIUM, IONIZED: Calcium, Ionized: 2.46 mEq/L (ref 2.30–2.58)

## 2019-07-11 LAB — SODIUM, URINE, RANDOM: Urine Sodium Random: <20 mEq/L

## 2019-07-11 LAB — CREATININE, URINE, RANDOM: Urine Creatinine, Random: 191.8 mg/dL

## 2019-07-11 LAB — RAPID DRUG SCREEN, URINE
Barbiturate Screen, UR: NEGATIVE
Benzodiazepine Screen, UR: NEGATIVE
Cannabinoid Screen, UR: POSITIVE — AB
Cocaine, UR: NEGATIVE
Opiate Screen, UR: NEGATIVE
PCP Screen, UR: NEGATIVE
Urine Amphetamine Screen: NEGATIVE

## 2019-07-11 LAB — HEMOLYSIS INDEX: Hemolysis Index: 26 — ABNORMAL HIGH (ref 0–18)

## 2019-07-11 LAB — VITAMIN D,25 OH,TOTAL: Vitamin D, 25 OH, Total: <8 ng/mL — ABNORMAL LOW (ref 30–100)

## 2019-07-11 LAB — CK: Creatine Kinase (CK): 600 U/L — ABNORMAL HIGH (ref 47–267)

## 2019-07-11 MED ORDER — VITAMIN D (ERGOCALCIFEROL) 1.25 MG (50000 UT) PO CAPS
50000.00 [IU] | ORAL_CAPSULE | ORAL | Status: DC
Start: 2019-07-11 — End: 2019-07-16

## 2019-07-11 MED ORDER — ACETAMINOPHEN 325 MG PO TABS
650.0000 mg | ORAL_TABLET | ORAL | Status: DC | PRN
Start: 2019-07-11 — End: 2020-06-14

## 2019-07-11 MED ORDER — LORAZEPAM 2 MG/ML IJ SOLN
1.00 mg | Freq: Three times a day (TID) | INTRAMUSCULAR | Status: DC | PRN
Start: 2019-07-11 — End: 2019-07-11
  Administered 2019-07-11: 07:00:00 1 mg via INTRAVENOUS
  Filled 2019-07-11: qty 1

## 2019-07-11 MED ORDER — LORAZEPAM 2 MG/ML IJ SOLN
1.00 mg | Freq: Three times a day (TID) | INTRAMUSCULAR | Status: DC | PRN
Start: 2019-07-11 — End: 2019-07-11

## 2019-07-11 MED ORDER — VITAMIN D (ERGOCALCIFEROL) 1.25 MG (50000 UT) PO CAPS
50000.00 [IU] | ORAL_CAPSULE | ORAL | Status: DC
Start: 2019-07-11 — End: 2019-07-11
  Administered 2019-07-11: 13:00:00 50000 [IU] via ORAL
  Filled 2019-07-11: qty 1

## 2019-07-11 MED ORDER — LORAZEPAM 2 MG/ML IJ SOLN
1.00 mg | Freq: Once | INTRAMUSCULAR | Status: AC
Start: 2019-07-11 — End: 2019-07-11
  Administered 2019-07-11: 03:00:00 1 mg via INTRAVENOUS
  Filled 2019-07-11: qty 1

## 2019-07-11 NOTE — Progress Notes (Signed)
Neuro: AOX4. Neuro intact. Able to make needs known  CV: Clear. Denies chest pain  Resp: even and unlabored, on room air. No sob.   GI: soft. No distention. No BM this shift. Endorses nausea. 1 instance of vomitting  GU: Voiding freely into urinal.   Integ: Intact   Musc: Independent  Lines/Drains: PIV to L forearm. Saline locked No bleeding, redness, irritation noted. Dressing c.d.I  Fall Risk: Moderate  Pain: Endorses mild abdominal pain r/t nausea

## 2019-07-11 NOTE — Progress Notes (Signed)
Patient requesting work note from MD stating that he cannot travel down to [place] tomorrow for his court date due to this hospitalization. Per medicine resident, previous note written and given to patient stating discharge unsafe at time of writing (07/10/19), but after calling resident again with patient's new request, no new letter will be written as patient has been cleared for discharge. Explained to pt and mother.

## 2019-07-11 NOTE — Progress Notes (Signed)
Provided AVS handout to Pt and verbally explained it to both pt and pt's mother.  Pt and mother are aware of 11/16 @ 1030am appointment for [facility].  Instructed to schedule appointment for Dr. Allena Katz ASAP.  Home medications explained.  All questions answered.  Pt and mother walked off the floor together.

## 2019-07-11 NOTE — Progress Notes (Signed)
Discharge Planning---Case Management  CM spoke with mother regarding TCM follow up and clinic appt. Pt needs psychologist/psychiatrist for TBI, cannabis use disorder per mom, but has to have letter from PCP to get him back on her insurance first. Encouraged pt mother to keep TCM appointment and get set up with PCP ASAP to achieve this. Pt mother verbalized understanding via teach back method. CM will continue to follow for [place] needs.   Dolores Frame, RN, BSN, CMII  [facility]-- Case Management  [phone]

## 2019-07-11 NOTE — Progress Notes (Signed)
[facility] PROGRESS NOTE  Myrla Halsted, x [phone] [facility])      Date Time: 07/11/19 11:03 AM  Patient Name: Joel [name], Joel [name].  Attending Physician: Barbette Hair, MD    CC: follow-up AKI    Assessment:     1) AKI - volume depletion from nausea, vomiting, poor PO intake  -may have progressed to ATN  -Improved  2) Proteinuria - mild. ?nephritis. Upro:Cr 400 mg  -only small blood on UA, which accountable by mild rhabdo  -Serologies negative to date  3) Mild rhabdo  4) Acidosis -improved  5) Hypercalcemia -improved      Recommendations:     1) F/U pending serologies - can be done as OP  2) No renal biopsy at this time  3) Can be discharged from renal standpoint with OP f/u in our office.        Case discussed with: patient, RN    Glenard Haring, MD  [facility]  [phone] (office)  X [phone] [facility])      Subjective: Feels OK.  Anxious to leave    Review of Systems:   Review of Systems - no nausea, no abdominal pain, no CP      Physical Exam:     Vitals:    07/11/19 0036 07/11/19 0428 07/11/19 0724 07/11/19 1013   BP: (!) 146/93 134/88 (!) 135/93 130/75   Pulse: 99 (!) 104 (!) 109    Resp: 18 18 16     Temp: 99.1 F (37.3 C) 98.3 F (36.8 C) 98.8 F (37.1 C)    TempSrc: Oral Oral Oral    SpO2: 100% 100% 97%    Weight:       Height:           Intake and Output Summary (Last 24 hours) at Date Time    Intake/Output Summary (Last 24 hours) at 07/11/2019 1103  Last data filed at 07/11/2019 0300  Gross per 24 hour   Intake 50 ml   Output 900 ml   Net -850 ml       General: awake, alert, oriented x 3, no acute distress.  HEENT:  Moist mucous membranes  Cardiovascular: regular rate and rhythm, no murmurs, rubs or gallops  Lungs: clear to auscultation bilaterally, without wheezing, rhonchi, or rales  Abdomen: soft, non-tender, non-distended, normoactive bowel sounds  Extremities: no LE edema      Meds:      Scheduled Meds: PRN Meds:    pantoprazole, 40 mg, Intravenous,  Daily  sertraline, 25 mg, Oral, Daily  sodium chloride, 1,000 mL, Intravenous, Once  vitamin D (ergocalciferol), 50,000 Units, Oral, Weekly          Continuous Infusions:   acetaminophen, 650 mg, Q4H PRN  LORazepam, 1 mg, Q8H PRN  naloxone, 0.2 mg, PRN  traZODone, 50 mg, QHS PRN              Labs:     Recent Labs   Lab 07/11/19  0442 07/10/19  0427 07/09/19  0143   WBC 10.15* 14.48* 19.27*   Hgb 19.0* 18.9* 18.3*   Hematocrit 53.8* 55.5* 54.1*   Platelets 268 245 286     Recent Labs   Lab 07/11/19  0442 07/10/19  1421 07/10/19  0427  07/09/19  0104 07/08/19  2212   Sodium 132* 129* 133*  More results in Results Review 140 138   Potassium 3.8 3.8 3.9  More results in Results Review 4.2 4.8  Chloride 94* 91* 95*  More results in Results Review 104 98*   CO2 24 22 19*  More results in Results Review 18* 18*   BUN 34.0* 44.0* 45.0*  More results in Results Review 35.0* 34.0*   Creatinine 1.5* 2.0* 2.6*  More results in Results Review 4.6* 5.2*   Calcium 10.2 10.3 10.3  More results in Results Review 10.1 12.0*   Albumin  --   --   --   --   --  6.3*   Phosphorus  --   --   --   --  6.3*  --    Magnesium  --   --   --   --  2.2  --    Glucose 105* 118* 105*  More results in Results Review 114* 110*   EGFR >60.0 48.4 35.7  More results in Results Review 18.5 16.1   More results in Results Review = values in this interval not displayed.       Recent Labs   Lab 07/11/19  1000 07/09/19  0425   Urine Type Clean Catch Urine, Clean Ca   Color, UA Yellow Amber*   Clarity, UA Clear Cloudy*   Specific Gravity UA 1.016 1.026   Urine pH 6.0 5.0   Nitrite, UA Negative Negative   Ketones UA Negative Negative   Urobilinogen, UA Normal Normal   Bilirubin, UA Negative Negative   Blood, UA Negative Small*   RBC, UA  --  0 - 2   WBC, UA  --  0 - 5     Recent Labs   Lab 07/11/19  0533 07/10/19  0105   Urine Protein/Creatinine Ratio  --  0.4   Urine Sodium Random <20  --    Urine Creatinine, Random 191.8 406.0       Imaging personally  reviewed, including: No results found.        Signed by: Glenard Haring, MD

## 2019-07-11 NOTE — Progress Notes (Signed)
Attending Attestation:     I have seen and personally examined the patient.  I agree with the findings and exam as documented by the resident with following caveats:     Hospital course:  This is a 27-year-old male with a history significant for TBI, bipolar disorder, marijuana abuse, subsequent cannabis hyperemesis syndrome presenting with abdominal pain nausea and vomiting.  He has not used marijuana for 5 days as he was feeling nauseated and having increased vomiting.  He presented to the emergency room tachypneic, tachycardic, with a leukocytosis and acute renal failure with a creatinine of 5.2.  Though patient met SIRS criteria sepsis was not ruled in as there was no obvious source.  Possible that the nausea and vomiting was actually from renal failure as it is not as likely that he would have the significant of renal failure with just a few days of nausea and vomiting with significant proteinuria.  He does have an uncle that was placed on dialysis in his 50s but he is not sure why.  He thinks that he himself may have had some sort of renal insufficiency in the past but is unable to articulate any more about it.  Patient CK was also elevated in the thousands is trending down now and he is urinating. Per renal ok to [place]. Pt is able to tolerate po and ready for .       Cedrick Partain J Keasia Dubose, MD

## 2019-07-11 NOTE — Discharge Instr - AVS First Page (Addendum)
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 In someone your age this type of kidney damage often gets better over time, but with the decrease in your kidney function and decrease in amount you were peeing we kept you in the hospital for an extensive medical workup. After performing an extensive workup the most likely cause of your kidney injury is likely from some form of muscle damage (can be from severe dehydration, drug side effect or unknown cause) which releases a small molecule that can temporarily damage your kidney. However, we still have multiple medical tests and studies that need to be follow up with through our kidney doctor, Dr. Allena Katz.     Instructions for after your discharge:  We have made no changes to your overall medications. While you were in the hospital we saw on your heart scan for the rhythm of your heart (EKG) that there was a lengthening of on part of your rhythm which makes it difficult to give you anti-nausea medications. What we would suggest to help with nausea once you leave the hospital would be to avoid marijuana which seems to cause these symptoms.   -We have otherwise not changed your home medications at this time but would strongly suggest close follow up with both your primary care provider and your psychiatrist  We know you recently have not had a primary care provider and would like you to follow up with our clinic as an outpatient within one week. Their information is as above and we will help to connect you to the appropriate care.   Follow up with Dr. Allena Katz our kidney doctor within 1  week for further management of your acute kidney damage that has slowly been improving.       If your symptoms worsen please contact you PCP immediately. If you feel your symptoms need immediate attention please seek care at an emergency room or call 911.      Sincerely,  Your Medical Team at [facility]

## 2019-07-11 NOTE — Progress Notes (Signed)
Attending Attestation:     I have seen and personally examined the patient.  I agree with the findings and exam as documented by the resident with following caveats:     Hospital course:  This is a 28 year old male with a history significant for TBI, bipolar disorder, marijuana abuse, subsequent cannabis hyperemesis syndrome presenting with abdominal pain nausea and vomiting.  He has not used marijuana for 5 days as he was feeling nauseated and having increased vomiting.  He presented to the emergency room tachypneic, tachycardic, with a leukocytosis and acute renal failure with a creatinine of 5.2.  Though patient met SIRS criteria sepsis was not ruled in as there was no obvious source.  Possible that the nausea and vomiting was actually from renal failure as it is not as likely that he would have the significant of renal failure with just a few days of nausea and vomiting with significant proteinuria.  He does have an uncle that was placed on dialysis in his 58s but he is not sure why.  He thinks that he himself may have had some sort of renal insufficiency in the past but is unable to articulate any more about it.  Patient CK was also elevated in the thousands is trending down now and he is urinating. Per renal ok to [place]. Pt is able to tolerate po and ready for [place].       Barbette Hair, MD

## 2019-07-11 NOTE — Progress Notes (Signed)
07/11/19 1538   Discharge Disposition   Patient preference/choice provided? N/A   Physical Discharge Disposition Home   Mode of Transportation Car   Patient/Family/POA notified of transfer plan Yes   Patient agreeable to discharge plan/expected d/c date? Yes   Family/POA agreeable to discharge plan/expected d/c date? Yes   Bedside nurse notified of transport plan? Yes   CM Interventions   Follow up appointment scheduled? Yes   Reason no follow up scheduled? Referred to [facility] D/C clinic  (TCM appt to get estab with PCP)   Referral made for home health RN visit? Does not meet home bound criteria   Multidisciplinary rounds/family meeting before d/c? No   Medicare Checklist   Is this a Medicare patient? No

## 2019-07-11 NOTE — Progress Notes (Signed)
[facility]    Received a referral to schedule a follow up appointment with the [facility].  Appointment scheduled for Monday 07/16/2019 ay 10:30 am    Clinic address is as follows:    [address] [place], [address]      Please notify patient to arrive 15 minutes early to the appointment and bring the following materials with them:    Insurance card (if insured) and photo ID  Medications in their original bottles  Glucometer/blood sugar log (if diabetic)  Weight log (if heart failure)  Proof of income (to enroll in medication assistance programs-first two pages of signed 1040 tax forms or last 2 months of pay stubs)      Celine Mans   Patient Access II  [facility]  P: [phone]  F: [phone]

## 2019-07-12 LAB — ERYTHROPOIETIN: Erythropoietin: 2.2 — ABNORMAL LOW (ref 2.6–18.5)

## 2019-07-12 LAB — ANCA PANEL FOR VASCULITIS
Myeloperoxidase Antibody: <0.2 U
Proteinase 3 Antibody: <0.2 U

## 2019-07-12 LAB — URINE OSMOLALITY: Urine Osmolality: 786 mosm/kg (ref 300–1094)

## 2019-07-13 ENCOUNTER — Emergency Department
Admission: EM | Admit: 2019-07-13 | Discharge: 2019-07-13 | Disposition: A | Discharge status: Home / Self Care | Payer: Medicaid HMO | Attending: Emergency Medicine | Admitting: Emergency Medicine

## 2019-07-13 ENCOUNTER — Emergency Department: Payer: Medicaid HMO

## 2019-07-13 DIAGNOSIS — N179 Acute kidney failure, unspecified: Secondary | ICD-10-CM | POA: Insufficient documentation

## 2019-07-13 DIAGNOSIS — R112 Nausea with vomiting, unspecified: Secondary | ICD-10-CM | POA: Insufficient documentation

## 2019-07-13 DIAGNOSIS — R079 Chest pain, unspecified: Secondary | ICD-10-CM

## 2019-07-13 DIAGNOSIS — R7989 Other specified abnormal findings of blood chemistry: Secondary | ICD-10-CM | POA: Insufficient documentation

## 2019-07-13 DIAGNOSIS — Z87828 Personal history of other (healed) physical injury and trauma: Secondary | ICD-10-CM

## 2019-07-13 DIAGNOSIS — R9431 Abnormal electrocardiogram [ECG] [EKG]: Secondary | ICD-10-CM | POA: Insufficient documentation

## 2019-07-13 DIAGNOSIS — Z87820 Personal history of traumatic brain injury: Secondary | ICD-10-CM | POA: Insufficient documentation

## 2019-07-13 DIAGNOSIS — Z87891 Personal history of nicotine dependence: Secondary | ICD-10-CM | POA: Insufficient documentation

## 2019-07-13 LAB — CBC AND DIFFERENTIAL
Absolute NRBC: 0 10*3/uL (ref 0.00–0.00)
Basophils Absolute Automated: 0.02 10*3/uL (ref 0.00–0.08)
Basophils Automated: 0.2 %
Eosinophils Absolute Automated: 0.01 10*3/uL (ref 0.00–0.44)
Eosinophils Automated: 0.1 %
Hematocrit: 52.9 % — ABNORMAL HIGH (ref 37.6–49.6)
Hgb: 18.1 g/dL — ABNORMAL HIGH (ref 12.5–17.1)
Immature Granulocytes Absolute: 0.03 10*3/uL (ref 0.00–0.07)
Immature Granulocytes: 0.3 %
Lymphocytes Absolute Automated: 1.61 10*3/uL (ref 0.42–3.22)
Lymphocytes Automated: 15.7 %
MCH: 30.2 pg (ref 25.1–33.5)
MCHC: 34.2 g/dL (ref 31.5–35.8)
MCV: 88.3 fL (ref 78.0–96.0)
MPV: 11.2 fL (ref 8.9–12.5)
Monocytes Absolute Automated: 0.59 10*3/uL (ref 0.21–0.85)
Monocytes: 5.8 %
Neutrophils Absolute: 7.97 10*3/uL — ABNORMAL HIGH (ref 1.10–6.33)
Neutrophils: 77.9 %
Nucleated RBC: 0 /100 WBC (ref 0.0–0.0)
Platelets: 266 10*3/uL (ref 142–346)
RBC: 5.99 10*6/uL — ABNORMAL HIGH (ref 4.20–5.90)
RDW: 12 % (ref 11–15)
WBC: 10.23 10*3/uL — ABNORMAL HIGH (ref 3.10–9.50)

## 2019-07-13 LAB — BASIC METABOLIC PANEL
Anion Gap: 15 (ref 5.0–15.0)
BUN: 33 mg/dL — ABNORMAL HIGH (ref 9.0–28.0)
CO2: 28 mEq/L (ref 22–29)
Calcium: 10.8 mg/dL — ABNORMAL HIGH (ref 8.5–10.5)
Chloride: 88 mEq/L — ABNORMAL LOW (ref 100–111)
Creatinine: 1.5 mg/dL — ABNORMAL HIGH (ref 0.7–1.3)
Glucose: 109 mg/dL — ABNORMAL HIGH (ref 70–100)
Potassium: 3.9 mEq/L (ref 3.5–5.1)
Sodium: 131 mEq/L — ABNORMAL LOW (ref 136–145)

## 2019-07-13 LAB — ECG 12-LEAD
Atrial Rate: 97 {beats}/min
P Axis: 62 degrees
P-R Interval: 124 ms
Q-T Interval: 418 ms
QRS Duration: 114 ms
QTC Calculation (Bezet): 530 ms
R Axis: 97 degrees
T Axis: 63 degrees
Ventricular Rate: 97 {beats}/min

## 2019-07-13 LAB — HEPATIC FUNCTION PANEL
ALT: 21 U/L (ref 0–55)
AST (SGOT): 24 U/L (ref 5–34)
Albumin/Globulin Ratio: 1.5 (ref 0.9–2.2)
Albumin: 5.4 g/dL — ABNORMAL HIGH (ref 3.5–5.0)
Alkaline Phosphatase: 63 U/L (ref 38–106)
Bilirubin Direct: 0.6 mg/dL — ABNORMAL HIGH (ref 0.0–0.5)
Bilirubin Indirect: 1 mg/dL (ref 0.2–1.0)
Bilirubin, Total: 1.6 mg/dL — ABNORMAL HIGH (ref 0.2–1.2)
Globulin: 3.6 g/dL (ref 2.0–3.6)
Protein, Total: 9 g/dL — ABNORMAL HIGH (ref 6.0–8.3)

## 2019-07-13 LAB — GFR: EGFR: >60

## 2019-07-13 LAB — LIPASE: Lipase: 25 U/L (ref 8–78)

## 2019-07-13 LAB — CK: Creatine Kinase (CK): 405 U/L — ABNORMAL HIGH (ref 47–267)

## 2019-07-13 MED ORDER — ACETAMINOPHEN 500 MG PO TABS
1000.0000 mg | ORAL_TABLET | Freq: Once | ORAL | Status: DC
Start: 2019-07-13 — End: 2019-07-13

## 2019-07-13 MED ORDER — SODIUM CHLORIDE 0.9 % IV BOLUS
1000.00 mL | Freq: Once | INTRAVENOUS | Status: AC
Start: 2019-07-13 — End: 2019-07-13
  Administered 2019-07-13: 13:00:00 1000 mL via INTRAVENOUS

## 2019-07-13 MED ORDER — SODIUM CHLORIDE 0.9 % IV SOLN
12.5000 mg | Freq: Once | INTRAVENOUS | Status: AC
Start: 2019-07-13 — End: 2019-07-13
  Administered 2019-07-13: 13:00:00 12.5 mg via INTRAVENOUS
  Filled 2019-07-13: qty 0.5

## 2019-07-13 MED ORDER — PROMETHAZINE HCL 25 MG RE SUPP
12.50 mg | Freq: Three times a day (TID) | RECTAL | 0 refills | Status: DC | PRN
Start: 2019-07-13 — End: 2019-07-16

## 2019-07-13 MED ORDER — MORPHINE SULFATE 4 MG/ML IJ/IV SOLN (WRAP)
4.0000 mg | Freq: Once | Status: AC
Start: 2019-07-13 — End: 2019-07-13
  Administered 2019-07-13: 13:00:00 4 mg via INTRAVENOUS
  Filled 2019-07-13: qty 1

## 2019-07-13 MED ORDER — LORAZEPAM 1 MG PO TABS
0.5000 mg | ORAL_TABLET | Freq: Two times a day (BID) | ORAL | 0 refills | Status: DC | PRN
Start: 2019-07-13 — End: 2020-04-23

## 2019-07-13 MED ORDER — ONDANSETRON HCL 4 MG/2ML IJ SOLN
4.00 mg | Freq: Three times a day (TID) | INTRAMUSCULAR | Status: DC | PRN
Start: 2019-07-13 — End: 2019-07-13
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
## 2019-07-13 NOTE — ED Provider Notes (Signed)
Attending Note:     The patient was seen and examined by the resident and myself. I agree with the plan as it was presented to me.   I assessed this patient at 10:48 AM. I reviewed the vital signs, nursing notes, past medical history, past surgical history, family history and social history. I am the first attending provider for this patient.   - Alonni Heimsoth Josem Kaufmann, MD    Hx: 28 y.o. male with hx significant for TBI, bipolar disorder, marijuana abuse, subsequent cannabis hyperemesis syndrome with recent admit for same returns with n/v. States he is not using MJ but does not have an anti-em, was told not to take Zofran d/t his QT interval. Denies change in sx, worsening abd pain, fever. AKI noted on prev eval that had impr upon d/c.    ROS: All others reviewed and negative unless specified in HPI.     PE:  Constitutional: Vital signs reviewed. Pt is labile with occasionally erratic behavior but in NAD.   Head: Normocephalic, atraumatic  Eyes: No conjunctival injection. No discharge.  ENT: Mucous membranes moist  Neck: Normal range of motion. Non-tender.  Respiratory/Chest: Clear to auscultation. No respiratory distress.   Cardiovascular: Regular rate and rhythm. No murmur.   Abdomen: Soft and non-tender. No guarding. No masses or hepatosplenomegaly.  Back: Nrml appearance, no flank ttp, no midline ttp or masses.   LowerExtremity: No edema. No cyanosis.  Neurological: No focal motor deficits by observation. Speech normal. Memory normal.  Msk: Nrml ROM, non-ttp  Skin: Warm and dry. No rash.  Lymphatic: No cervical lymphadenopathy.  Psychiatric: Labile affect. Poor concentration.    Labs:     Results     Procedure Component Value Units Date/Time    GFR [[id number]] Collected: 07/13/19 1213     Updated: 07/13/19 1250     EGFR >60.0    Narrative:      Replace urinary catheter prior to obtaining the urine culture  if it has been in place for greater than or equal to 14  days:->N/A No Foley  Indications for U/A Reflex to Micro  - Reflex to  Culture:->Suprapubic Pain/Tenderness or Dysuria    Hepatic function panel (LFT) [[id number]]  (Abnormal) Collected: 07/13/19 1213    Specimen: Blood Updated: 07/13/19 1250     Bilirubin, Total 1.6 mg/dL      Bilirubin Direct 0.6 mg/dL      Bilirubin Indirect 1.0 mg/dL      AST (SGOT) 24 U/L      ALT 21 U/L      Alkaline Phosphatase 63 U/L      Protein, Total 9.0 g/dL      Albumin 5.4 g/dL      Globulin 3.6 g/dL      Albumin/Globulin Ratio 1.5    Narrative:      Replace urinary catheter prior to obtaining the urine culture  if it has been in place for greater than or equal to 14  days:->N/A No Foley  Indications for U/A Reflex to Micro - Reflex to  Culture:->Suprapubic Pain/Tenderness or Dysuria    Lipase [[id number]] Collected: 07/13/19 1213    Specimen: Blood Updated: 07/13/19 1250     Lipase 25 U/L     Narrative:      Replace urinary catheter prior to obtaining the urine culture  if it has been in place for greater than or equal to 14  days:->N/A No Foley  Indications for U/A Reflex to Micro - Reflex to  Culture:->Suprapubic Pain/Tenderness  or Dysuria    Basic Metabolic Panel [[id number]]  (Abnormal) Collected: 07/13/19 1213    Specimen: Blood Updated: 07/13/19 1250     Glucose 109 mg/dL      BUN 82.9 mg/dL      Creatinine 1.5 mg/dL      Calcium 56.2 mg/dL      Sodium 130 mEq/L      Potassium 3.9 mEq/L      Chloride 88 mEq/L      CO2 28 mEq/L      Anion Gap 15.0    Narrative:      Replace urinary catheter prior to obtaining the urine culture  if it has been in place for greater than or equal to 14  days:->N/A No Foley  Indications for U/A Reflex to Micro - Reflex to  Culture:->Suprapubic Pain/Tenderness or Dysuria    Creatine Kinase (CK) [[id number]]  (Abnormal) Collected: 07/13/19 1213    Specimen: Blood Updated: 07/13/19 1249     Creatine Kinase (CK) 405 U/L     CBC and differential [[id number]]  (Abnormal) Collected: 07/13/19 1213    Specimen: Blood Updated: 07/13/19 1235     WBC 10.23 x10 3/uL      Hgb  18.1 g/dL      Hematocrit 44.0 %      Platelets 266 x10 3/uL      RBC 5.99 x10 6/uL      MCV 88.3 fL      MCH 30.2 pg      MCHC 34.2 g/dL      RDW 12 %      MPV 11.2 fL      Neutrophils 77.9 %      Lymphocytes Automated 15.7 %      Monocytes 5.8 %      Eosinophils Automated 0.1 %      Basophils Automated 0.2 %      Immature Granulocytes 0.3 %      Nucleated RBC 0.0 /100 WBC      Neutrophils Absolute 7.97 x10 3/uL      Lymphocytes Absolute Automated 1.61 x10 3/uL      Monocytes Absolute Automated 0.59 x10 3/uL      Eosinophils Absolute Automated 0.01 x10 3/uL      Basophils Absolute Automated 0.02 x10 3/uL      Immature Granulocytes Absolute 0.03 x10 3/uL      Absolute NRBC 0.00 x10 3/uL     Narrative:      Replace urinary catheter prior to obtaining the urine culture  if it has been in place for greater than or equal to 14  days:->N/A No Foley  Indications for U/A Reflex to Micro - Reflex to  Culture:->Suprapubic Pain/Tenderness or Dysuria          Rads:     XR Chest AP Portable   Final Result   . Hyperinflation with no focal pulmonary opacity      Marty Heck, MD    07/13/2019 1:14 PM          Medical Decision Making:   I reviewed the vital signs, nursing notes, past medical history, past surgical history, family history and social history.  Vital Signs -   No data found.    Clinical course and plan:   Sx impr in ED with safe anti-em, pt put mom on speakerphone which calmed him down and allowed [place] to discuss, make plan, re-eval pt who appears to be improved. There has been no further worsening of his renal  fxn. Rx Phen supp with GI f/u, encouraged to cont to obst from MJ.    Clinical impression:   1. Non-intractable vomiting with nausea, unspecified vomiting type    2. Acute renal failure, unspecified acute renal failure type    3. History of traumatic head injury    4. Abnormal LFTs    5. Prolonged Q-T interval on ECG      Disposition:   ED Disposition     ED Disposition Condition Date/Time Comment    Discharge   Fri Jul 13, 2019  2:45 PM [name]. discharge to home/self care.    Condition at disposition: Stable            Procedures:  Procedures    DDx: Tamala Bari hyper syndr, gastropar, acute abd, other    Rhythm Strip Interpretation / Cardiac Monitor Analysis: Yes: SR 70's    Pulse Ox Analysis: Yes: saturation: 97 %; Oxygen use: room air; Interpretation: Normal      Critical Care Time: No Critical Care Time     Splint or Radiology: N/A    Final Diagnosis:     Final diagnoses:  Final diagnoses:   Acute renal failure, unspecified acute renal failure type   History of traumatic head injury   Abnormal LFTs   Non-intractable vomiting with nausea, unspecified vomiting type   Prolonged Q-T interval on ECG       ED Disposition:   ED Disposition     ED Disposition Condition Date/Time Comment    Discharge  Fri Jul 13, 2019  2:45 PM [name]. discharge to home/self care.    Condition at disposition: Stable          Medications   sodium chloride 0.9 % bolus 1,000 mL (1,000 mLs Intravenous New Bag 07/13/19 1238)   promethazine (PHENERGAN) 12.5 mg in sodium chloride 0.9 % 50 mL IVPB (12.5 mg Intravenous Given 07/13/19 1317)   morphine injection 4 mg (4 mg Intravenous Given 07/13/19 1238)     _______________________________      Signed by: Jeralyn Ruths. Joseph Art, MD     Leitha Schuller, MD  07/19/19 [phone]

## 2019-07-13 NOTE — Discharge Instructions (Signed)
Dear Mr. Babbit:    Thank you for choosing the [facility] Emergency Department, the premier emergency department in the [place] area.  I hope your visit today was EXCELLENT. You will receive a survey via text message that will give you the opportunity to provide feedback to your team about your visit. Please do not hesitate to reach out with any questions!    Specific instructions for your visit today:    Please follow up with your primary care doctor and nephrology as previously scheduled  You are prescribed phenergan suppository (pill to place inside of rectum) to prevent nausea. You can take this medication up to three times a day as needed for nausea.  Additionally, you can take over the counter benadryl (25 or 50mg ) up to twice daily as needed for nausea if phenergan does not work. Of note, this medication can make you sleepy.   Additionally, you can take ativan for nausea if phenergan does not work. But you have a limited number of pills, so use cautiously.       IF YOU DO NOT CONTINUE TO IMPROVE OR YOUR CONDITION WORSENS, PLEASE CONTACT YOUR DOCTOR OR RETURN IMMEDIATELY TO THE EMERGENCY DEPARTMENT.    Sincerely,  Leitha Schuller, MD  Attending Emergency Physician  [facility] Emergency Department    ONSITE PHARMACY  Our full service onsite pharmacy is located in the ER waiting room.  Open 7 days a week from 9 am to 9 pm.  We accept all major insurances and prices are competitive with major retailers.  Ask your provider to print your prescriptions down to the pharmacy to speed you on your way home.    OBTAINING A PRIMARY CARE APPOINTMENT    Primary care physicians (PCPs, also known as primary care doctors) are either internists or family medicine doctors. Both types of PCPs focus on health promotion, disease prevention, patient education and counseling, and treatment of acute and chronic medical conditions.    Call for an appointment with a primary care doctor.  Ask to see who is taking new  patients.     [facility]  telephone:  [phone]  [email or web address]    DOCTOR REFERRALS  Call [phone] (available 24 hours a day, 7 days a week) if you need any further referrals and we can help you find a primary care doctor or specialist.  Also, available online at:  [email or web address]    YOUR CONTACT INFORMATION  Before leaving please check with registration to make sure we have an up-to-date contact number.  You can call registration at [phone] to update your information.  For questions about your hospital bill, please call [phone].  For questions about your Emergency Dept Physician bill please call [phone].      FREE HEALTH SERVICES  If you need help with health or social services, please call 2-1-1 for a free referral to resources in your area.  2-1-1 is a free service connecting people with information on health insurance, free clinics, pregnancy, mental health, dental care, food assistance, housing, and substance abuse counseling.  Also, available online at:  http://www.[facility].org    MEDICAL RECORDS AND TESTS  Certain laboratory test results do not come back the same day, for example urine cultures.   We will contact you if other important findings are noted.  Radiology films are often reviewed again to ensure accuracy.  If there is any discrepancy, we will notify you.      Please call [phone] to pick up  a complimentary CD of any radiology studies performed.  If you or your doctor would like to request a copy of your medical records, please call [phone].      ORTHOPEDIC INJURY   Please know that significant injuries can exist even when an initial x-ray is read as normal or negative.  This can occur because some fractures (broken bones) are not initially visible on x-rays.  For this reason, close outpatient follow-up with your primary care doctor or bone specialist (orthopedist) is required.    MEDICATIONS AND FOLLOWUP  Please be aware  that some prescription medications can cause drowsiness.  Use caution when driving or operating machinery.    The examination and treatment you have received in our Emergency Department is provided on an emergency basis, and is not intended to be a substitute for your primary care physician.  It is important that your doctor checks you again and that you report any new or remaining problems at that time.      24 HOUR PHARMACIES  The nearest 24 hour pharmacy is:    [facility]  [address]  [place], [place] [address]  [phone]      ASSISTANCE WITH INSURANCE    Affordable Care Act  [facility])  Call to start or finish an application, compare plans, enroll or ask a question.  [phone]  TTY: [phone]  Web:  Healthcare.gov    Help Enrolling in [facility]  [employer] [place]  [phone] (TOLL-FREE)  [phone] (TTY)  Web:  Http://www.coverva.org    Local Help Enrolling in the [facility]  [place] [facility]  [phone] (MAIN)  Email:  health-help@nvfs .org  Web:  BlackjackMyths.is  Address:  [address] [place], [place] [address]    SEDATING MEDICATIONS  Sedating medications include strong pain medications (e.g. narcotics), muscle relaxers, benzodiazepines (used for anxiety and as muscle relaxers), Benadryl/diphenhydramine and other antihistamines for allergic reactions/itching, and other medications.  If you are unsure if you have received a sedating medication, please ask your physician or nurse.  If you received a sedating medication: DO NOT drive a car. DO NOT operate machinery. DO NOT perform jobs where you need to be alert.  DO NOT drink alcoholic beverages while taking this medicine.     If you get dizzy, sit or lie down at the first signs. Be careful going up and down stairs.  Be extra careful to prevent falls.     Never give this medicine to others.     Keep this medicine out of reach of children.     Do not take or save old medicines. Throw them away when  outdated.     Keep all medicines in a cool, dry place. DO NOT keep them in your bathroom medicine cabinet or in a cabinet above the stove.    MEDICATION REFILLS  Please be aware that we cannot refill any prescriptions through the ER. If you need further treatment from what is provided at your ER visit, please follow up with your primary care doctor or your pain management specialist.    FREESTANDING EMERGENCY DEPARTMENTS OF [facility]  Did you know [facility] has two freestanding ERs located just a few miles away?  [facility] [place] and [facility] [place] have short wait times, easy free parking directly in front of the building and top patient satisfaction scores - and the same Board Certified Emergency Medicine doctors as [facility].  Nausea    You have been seen for nausea.    Nausea is the feeling that you are going to vomit (throw up). Nausea is not a disease. It is a symptom of another problem. For example, nausea, vomiting and diarrhea are symptoms of a stomach virus (the "stomach flu").    You may or may not vomit when you have nausea.     The nausea itself is not dangerous but it can be very uncomfortable.    We might not be able to find out today what is causing your nausea. It is VERY IMPORTANT to see your doctor who can watch for any serious problems.    There are many treatments for nausea. The medical staff will discuss these with you. Some common medicines used to help with nausea are   Promethazine (Phenergan), prochlorperazine (Compazine), metoclopramide (Reglan), ondansetron (Zofran), and many others.    Follow a clear liquid diet. Drink water, broth, 7-Up, Sprite, or other clear caffeine-free soft drinks or sports drinks until you feel better. This might help the nausea and keep you from vomiting.     If your nausea lasts for longer than a few days, or if you have new symptoms, we STRONGLY RECOMMEND that you go to see your family doctor,  specialist or clinic. If you cannot get an appointment or do not have a doctor you can always return here or go to the nearest emergency department to be seen again.    YOU SHOULD SEEK MEDICAL ATTENTION IMMEDIATELY, EITHER HERE OR AT THE NEAREST EMERGENCY DEPARTMENT, IF ANY OF THE FOLLOWING OCCURS:   You vomit often.   You have abdominal (belly) pain.   You vomit blood or anything that looks like coffee grounds.   You have a headache.   You have any new symptoms or concerns.   You feel more unwell.      Acute Kidney Injury    You have been diagnosed with an acute kidney injury (AKI).    You have an acute kidney injury when something damages your kidneys. Most of the time, this damage is not permanent, and your kidney will get better on its own. In some cases, though, the damage is more serious. This kind of damage can be permanent and impossible to cure.    Many things can damage your kidneys. Some of these things are:   Infections.    Certain medicines.    Anything that lowers blood flow to your kidneys.    Anything that blocks the flow of urine (pee) from your kidneys to your bladder.    Most often, an acute kidney injury is caused by:   Dehydration.   A decrease in your hearts ability to pump blood to your kidneys.   Pain medicines like NSAIDs (non-steroidal anti-inflammatory medications), for example ibuprofen or naproxen.    Blood pressure medicines like ace-inhibitors, for example lisinopril or enalapril.    Water pills (diuretics) that are taken for high blood pressure.    Large kidney stones. These can keep your kidney's urine (pee) flow blocked for weeks at a time.   An enlarged prostate. This can keep urine from leaving your bladder.    To check for an acute kidney injury, your doctor will test your creatinine levels. This is a common lab test. This test is an indirect way of measuring how well your kidneys keep your blood clean. Your doctor might also take a urine (pee) sample  or get an ultrasound of your kidneys.  The doctor might also test how much creatinine, sodium, and potassium there is in your urine (pee).     Most often, an acute kidney injury is not permanent. Your kidneys will go back to normal after your doctor has fixed the problem.    To help your symptoms, you should do the following at home:   Drink a lot of water. This will keep you hydrated.   Dont take NSAID pain medicines like ibuprofen or naproxen.   Follow up with your primary doctor or nephrologist (kidney doctor). If one of your medicines isnt working, you might be told to stop taking it.    Though we dont believe your condition is serious right now, it is important to be careful. Sometimes a problem that seems mild can become serious later. This is why it is very important that you return here or go to the nearest Emergency Department if you are not improving or your symptoms are getting worse.    YOU SHOULD SEEK MEDICAL ATTENTION IMMEDIATELY, EITHER HERE OR AT THE NEAREST EMERGENCY DEPARTMENT, IF ANY OF THE FOLLOWING OCCUR:     You feel very weak or short of breath.   You have nausea (feel sick to your stomach), or you are vomiting (throwing up).    You cant keep food or water down.   You have signs of infection. Some of these signs are:   Fever (temperature higher than 100.61F or 38C).   Heavy diarrhea (watery poop).    Back pain that you feel whenever you urinate (pee).    If you can't follow up with your doctor, or if at any time you feel you need to be rechecked or seen again, come back here or go to the nearest emergency department.

## 2019-07-13 NOTE — ED Triage Notes (Signed)
Pt c/o of N/V, Last vomited this morning; pt states color was green/yellow. Abdominal pain diffused. Pt also states SOB.

## 2019-07-15 LAB — STAGE II (REFLEX)
J0-Antibody: <1
Sjogren's Antibody (SS-A): <1
Sjogren's Antibody (SS-B): <1
scl-70 Antibody: <1

## 2019-07-15 LAB — CASCADING REFLEX FOR ANAQQ
Chromatin (Nucleosomal) Antibody: <1
DNA (ds) Double Stranded Antibody: <1 (ref <=4)
RNP Antibody: <1
Sm Antibody: <1
Sm/RNP Antibody: <1

## 2019-07-15 LAB — STAGE III (REFLEX)
Centromere B Antibody: <1
Ribosomal P Antibody: <1

## 2019-07-15 LAB — ANA IFA W/REFLEX TO TITER/PATTERN/AB: ANA Screen, IFA: POSITIVE — AB

## 2019-07-15 LAB — REFLEX ANA CASCADING INTERPRETATION

## 2019-07-15 LAB — ANA TITER AND PATTERN FOR QUEST: ANA Titer: 1:40 {titer} — AB

## 2019-07-16 ENCOUNTER — Encounter (INDEPENDENT_AMBULATORY_CARE_PROVIDER_SITE_OTHER): Payer: Self-pay

## 2019-07-16 ENCOUNTER — Ambulatory Visit (INDEPENDENT_AMBULATORY_CARE_PROVIDER_SITE_OTHER): Payer: Medicaid HMO | Admitting: Nurse Practitioner

## 2019-07-16 ENCOUNTER — Encounter (INDEPENDENT_AMBULATORY_CARE_PROVIDER_SITE_OTHER): Payer: Self-pay | Admitting: Nurse Practitioner

## 2019-07-16 VITALS — BP 144/92 | HR 97 | Temp 97.7°F | Resp 16 | Wt 125.2 lb

## 2019-07-16 DIAGNOSIS — N179 Acute kidney failure, unspecified: Secondary | ICD-10-CM

## 2019-07-16 DIAGNOSIS — E559 Vitamin D deficiency, unspecified: Secondary | ICD-10-CM

## 2019-07-16 DIAGNOSIS — F419 Anxiety disorder, unspecified: Secondary | ICD-10-CM

## 2019-07-16 DIAGNOSIS — N529 Male erectile dysfunction, unspecified: Secondary | ICD-10-CM

## 2019-07-16 DIAGNOSIS — N489 Disorder of penis, unspecified: Secondary | ICD-10-CM

## 2019-07-16 DIAGNOSIS — F1020 Alcohol dependence, uncomplicated: Secondary | ICD-10-CM

## 2019-07-16 LAB — ECG 12-LEAD
Atrial Rate: 86 {beats}/min
P Axis: 85 degrees
P-R Interval: 124 ms
Q-T Interval: 386 ms
QRS Duration: 120 ms
QTC Calculation (Bezet): 461 ms
R Axis: 93 degrees
T Axis: 74 degrees
Ventricular Rate: 86 {beats}/min

## 2019-07-16 MED ORDER — GABAPENTIN 100 MG PO CAPS
100.00 mg | ORAL_CAPSULE | Freq: Three times a day (TID) | ORAL | 0 refills | Status: DC
Start: 2019-07-16 — End: 2020-06-14

## 2019-07-16 MED ORDER — TRAZODONE HCL 50 MG PO TABS
50.0000 mg | ORAL_TABLET | Freq: Every evening | ORAL | 1 refills | Status: DC
Start: 2019-07-16 — End: 2021-04-26

## 2019-07-16 MED ORDER — SERTRALINE HCL 25 MG PO TABS
25.0000 mg | ORAL_TABLET | Freq: Every day | ORAL | 1 refills | Status: DC
Start: 2019-07-16 — End: 2020-04-23

## 2019-07-16 MED ORDER — PROMETHAZINE HCL 25 MG RE SUPP
12.50 mg | Freq: Three times a day (TID) | RECTAL | 0 refills | Status: AC | PRN
Start: 2019-07-16 — End: 2019-07-30

## 2019-07-16 MED ORDER — VITAMIN D (ERGOCALCIFEROL) 1.25 MG (50000 UT) PO CAPS
50000.00 [IU] | ORAL_CAPSULE | ORAL | 1 refills | Status: DC
Start: 2019-07-16 — End: 2020-06-14

## 2019-07-16 NOTE — Patient Instructions (Signed)
Alcohol Abuse  Alcoholic drinks harm you when you have too many of them. No set number of drinks defines too much. Drinking that affects your life or your health is called alcohol abuse. Alcohol abuse can hurt your relationships with others. You may lose friends, a spouse, or even your job. You may be abusing alcohol if any of the following are true for you:    Duties at home or with child care suffer because of drinking.   Duties at work or in school suffer because of drinking.   You have missed work or school because of drinking.   You use alcohol while driving or using machinery.   You have legal problems such as arrests because of drinking.   You keep drinking even though it causes serious problems in your life.  Health problems   Alcohol abuse causes many health problems.Sometimes this can happen after only drinking a "little." The effects depend on how much you drink at one time, and how often you drink.Alcohol affects all parts of your body   Brain  Alcohol affects the central nervous system. It can damage parts of the brain that control your balance and gait, memory, thinking, and emotions. It can cause:    Memory loss   Blackouts   Depression   Agitation   Sleep problems   Seizures  These changes may be long term (permanent).   Heart and blood vessels  Alcohol can damage heart muscle (cardiomyopathy). This can lead to:    Trouble breathing   Irregular heartbeat   Atrial fibrillation   Leg swelling   Heart failure  Alcohol also makes the blood vessels stiff. This causes high blood pressure.   All of these problems raise your risk of having a heart attack or stroke.   Liver  Alcohol causes fat to build up in the liver. This affects how the liver works. Alcohol also raises the risk for hepatitis. It can cause:    Belly (abdominal) pain   Belly swelling   Loss of appetite   Yellowed eyes or skin (jaundice)   Bleeding problems   Cirrhosis  This can affect your ability to fight off  infections and can cause diabetes.   The liver changes keep it from removing toxins in your blood that can cause brain disease (encephalopathy). This condition cause:    Confusion   Changed level of consciousness   Personality changes   Memory loss   Seizures, coma, and death  The liver changes can also cause the veins in your esophagus and stomach to become thin and swollen with blood (varices). This can cause bleeding and vomiting of blood.   Pancreas  Alcohol can cause swelling (inflammation) of the pancreas (pancreatitis). This can cause belly pain, fever, and diabetes.   Immune system  Alcohol weakens your immune system. This makes it harder for you to fight infections and colds. It also increases the chance of getting pneumonia and tuberculosis.   Cancer  Alcohol raises the risk for several types of cancer. These include cancer of the mouth, esophagus, pharynx, larynx, liver, and breast.   Sexual function  Alcohol can lead to sexual problems.   Home care   These guidelines will help you deal with alcohol abuse:   Admit you have a problem with alcohol.   Ask for help from your healthcare provider. Also ask for help from trusted family members or close friends.   Get help from people trained in dealing with alcohol abuse. This   may be one-on-one counseling or group therapy. Or it may be an alcohol treatment program.   Join a self-help group for alcohol abuse such as Alcoholics Anonymous.   Stay away from people who abuse alcohol or tempt you to drink.  Follow-up care   Follow up with your healthcare provider, or as advised. Contact these groups to get help:    Alcoholics Anonymous (AA).Go to www.aa.org. Or check the phone book for meetings near you.   National Alcohol and Substance Abuse Information Center (NASAIC). 800-784-6776, www.addictioncareoptions.com   National Council on Alcoholism and Drug Dependence (NCADD). 800-NCA-CALL (800-622-2255), www.ncadd.org  Call 911  Call 911 if any of these  occur:    Trouble breathing or slow, irregular breathing   Chest pain   Sudden weakness on one side of your body or sudden trouble speaking   Heavy bleeding or vomiting blood   Very drowsy or trouble awakening   Fainting or loss of consciousness   Rapid heart rate   Seizure  When to seek medical care   Call your healthcare provider right away if any of these occur:    Confusion   Seeing, hearing, or feeling things that aren't there (hallucinations)   Pain in your upper belly that gets worse   Vomiting that continues, vomiting with blood, or black or tarry stools   Severe shakiness  StayWell last reviewed this educational content on 03/30/2017   2000-2020 [employer]. [address], [place] [address]. All rights reserved. This information is not intended as a substitute for professional medical care. Always follow your healthcare professional's instructions.
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
## 2019-07-17 LAB — BASIC METABLOIC PANEL, 7 WITH EGFR AND WITHOUT CO2, SERUM
BUN / Creatinine Ratio: 21 — ABNORMAL HIGH (ref 9–20)
BUN: 22 mg/dL — ABNORMAL HIGH (ref 6–20)
Calcium: 10.4 mg/dL — ABNORMAL HIGH (ref 8.7–10.2)
Chloride: 87 mmol/L — ABNORMAL LOW (ref 96–106)
Creatinine: 1.07 mg/dL (ref 0.76–1.27)
EGFR: 109 mL/min/{1.73_m2} (ref >59)
EGFR: 94 mL/min/{1.73_m2} (ref >59)
Glucose: 107 mg/dL — ABNORMAL HIGH (ref 65–99)
Potassium: 3.8 mmol/L (ref 3.5–5.2)
Sodium: 134 mmol/L (ref 134–144)

## 2019-07-17 LAB — CK: Creatinine Kinase, Total: 719 U/L (ref 49–439)

## 2019-07-18 NOTE — Discharge Summary (Signed)
MEDICINE DISCHARGE SUMMARY    Date Time: 07/18/19 6:24 PM  Patient Name: Joel [name], Joel [name].  Attending Physician: No att. providers found  Primary Care Physician: Pcp, None, MD    Date of Admission: 07/08/2019  Date of Discharge: 07/11/2019    Discharge Diagnoses:   Rhabdomyolysis resulting in AKI   TBI  Bipolar disorder   Cannabis hyperemesis syndrome      Disposition:      Home with family    Pending Results, Recommendations & Instructions to providers after discharge:     1. Micro / Labs / Path pending:   Unresulted Labs     Procedure . . . Date/Time    Calcium, ionized [[id number]] Collected: 07/09/19 0206    Specimen: Blood Updated: 07/09/19 0206    Narrative:      SST also acceptable            Recent Labs:       Results     Procedure Component Value Units Date/Time    Rapid drug screen, urine [[id number]]  (Abnormal) Collected: 07/11/19 1000    Specimen: Urine Updated: 07/11/19 1049     Urine Amphetamine Screen Negative     Barbiturate Screen, UR Negative     Benzodiazepine Screen, UR Negative     Cannabinoid Screen, UR Positive     Cocaine, UR Negative     Opiate Screen, UR Negative     PCP Screen, UR Negative    UA, Reflex to Microscopic - No Culture [[id number]] Collected: 07/11/19 1000    Specimen: Urine Updated: 07/11/19 1041     Urine Type Clean Catch     Color, UA Yellow     Clarity, UA Clear     Specific Gravity UA 1.016     Urine pH 6.0     Leukocyte Esterase, UA Negative     Nitrite, UA Negative     Protein, UR Negative     Glucose, UA Negative     Ketones UA Negative     Urobilinogen, UA Normal mg/dL      Bilirubin, UA Negative     Blood, UA Negative    Urine Sodium Random [[id number]] Collected: 07/11/19 0533    Specimen: Urine Updated: 07/11/19 0958     Urine Sodium Random <20 mEq/L     Urine Creatinine Random [[id number]] Collected: 07/11/19 0533    Specimen: Urine Updated: 07/11/19 0854     Urine Creatinine, Random 191.8 mg/dL     Vitamin D,66 OH, Total [[id number]]  (Abnormal) Collected:  07/11/19 0442    Specimen: Blood Updated: 07/11/19 0749     Vitamin D, 25 OH, Total <8 ng/mL     Hemolysis index [[id number]]  (Abnormal) Collected: 07/11/19 0442     Updated: 07/11/19 0726     Hemolysis Index 26    Basic Metabolic Panel [[id number]]  (Abnormal) Collected: 07/11/19 0442    Specimen: Blood Updated: 07/11/19 0603     Glucose 105 mg/dL      BUN 16.6 mg/dL      Creatinine 1.5 mg/dL      Calcium 06.3 mg/dL      Sodium 016 mEq/L      Potassium 3.8 mEq/L      Chloride 94 mEq/L      CO2 24 mEq/L      Anion Gap 14.0    Creatine Kinase (CK) [[id number]]  (Abnormal) Collected: 07/11/19 0442    Specimen: Blood Updated: 07/11/19 0603     Creatine Kinase (CK) 600  U/L     GFR [[id number]] Collected: 07/11/19 0442     Updated: 07/11/19 0603     EGFR >60.0    Calcium, ionized [[id number]] Collected: 07/11/19 0533    Specimen: Blood Updated: 07/11/19 0557     Calcium, Ionized 2.46 mEq/L     CBC and differential [[id number]]  (Abnormal) Collected: 07/11/19 0442    Specimen: Blood Updated: 07/11/19 0525     WBC 10.15 x10 3/uL      Hgb 19.0 g/dL      Hematocrit 96.2 %      Platelets 268 x10 3/uL      RBC 6.14 x10 6/uL      MCV 87.6 fL      MCH 30.9 pg      MCHC 35.3 g/dL      RDW 13 %      MPV 11.6 fL      Neutrophils 79.0 %      Lymphocytes Automated 15.0 %      Monocytes 5.4 %      Eosinophils Automated 0.1 %      Basophils Automated 0.2 %      Immature Granulocytes 0.3 %      Nucleated RBC 0.0 /100 WBC      Neutrophils Absolute 8.02 x10 3/uL      Lymphocytes Absolute Automated 1.52 x10 3/uL      Monocytes Absolute Automated 0.55 x10 3/uL      Eosinophils Absolute Automated 0.01 x10 3/uL      Basophils Absolute Automated 0.02 x10 3/uL      Immature Granulocytes Absolute 0.03 x10 3/uL      Absolute NRBC 0.00 x10 3/uL     IFE Review, Serum [[id number]] Collected: 07/09/19 1212     Updated: 07/10/19 2016     IFE Serum Review See Note    Immunofixation electrophoresis, Serum [[id number]] Collected: 07/09/19 1212    Specimen:  Blood Updated: 07/10/19 2015     IFE Serum Interp See IFE Review    GFR [[id number]] Collected: 07/10/19 1421     Updated: 07/10/19 1448     EGFR 48.4    Basic Metabolic Panel [[id number]]  (Abnormal) Collected: 07/10/19 1421    Specimen: Blood Updated: 07/10/19 1448     Glucose 118 mg/dL      BUN 64.3 mg/dL      Creatinine 2.0 mg/dL      Calcium 32.9 mg/dL      Sodium 518 mEq/L      Potassium 3.8 mEq/L      Chloride 91 mEq/L      CO2 22 mEq/L      Anion Gap 16.0    Creatine Kinase (CK) [[id number]]  (Abnormal) Collected: 07/10/19 1421    Specimen: Blood Updated: 07/10/19 1448     Creatine Kinase (CK) 807 U/L     Creatine Kinase (CK) [[id number]]  (Abnormal) Collected: 07/10/19 0427    Specimen: Blood Updated: 07/10/19 0729     Creatine Kinase (CK) 1,047 U/L     CBC WITH MANUAL DIFFERENTIAL [[id number]]  (Abnormal) Collected: 07/10/19 0427     Updated: 07/10/19 0618     WBC 14.48 x10 3/uL      Hgb 18.9 g/dL      Hematocrit 42.7 %      Platelets 245 x10 3/uL      RBC 6.13 x10 6/uL      MCV 90.5 fL      MCH 30.8 pg      MCHC  34.1 g/dL      RDW 13 %      MPV 11.4 fL      Nucleated RBC 0.0 /100 WBC      Absolute NRBC 0.00 x10 3/uL      Segmented Neutrophils 79 %      Band Neutrophils 0 %      Lymphocytes Manual 14 %      Monocytes Manual 5 %      Eosinophils Manual 0 %      Basophils Manual 0 %      Atypical Lymphocytes % 2 %      Neutrophils Absolute Manual 11.44 x10 3/uL      Band Neutrophils Absolute 0.00 x10 3/uL      Lymphocytes Absolute Manual 2.03 x10 3/uL      Monocytes Absolute 0.72 x10 3/uL      Eosinophils Absolute Manual 0.00 x10 3/uL      Basophils Absolute Manual 0.00 x10 3/uL      Atypical Lymphocytes Absolute 0.29 x10 3/uL      Cell Morphology Normal     Platelet Estimate Normal     Giant Platelets Present    GFR [[id number]] Collected: 07/10/19 0427     Updated: 07/10/19 0532     EGFR 35.7    Basic Metabolic Panel [[id number]]  (Abnormal) Collected: 07/10/19 0427    Specimen: Blood Updated: 07/10/19 0532      Glucose 105 mg/dL      BUN 78.2 mg/dL      Creatinine 2.6 mg/dL      Calcium 95.6 mg/dL      Sodium 213 mEq/L      Potassium 3.9 mEq/L      Chloride 95 mEq/L      CO2 19 mEq/L      Anion Gap 19.0    Protein / creatinine ratio, urine [[id number]]  (Abnormal) Collected: 07/10/19 0105    Specimen: Urine Updated: 07/10/19 0528     Urine Protein Random 156.4 mg/dL      Urine Creatinine, Random 406.0 mg/dL      Urine Protein/Creatinine Ratio 0.4    GFR [[id number]] Collected: 07/09/19 1727     Updated: 07/09/19 1758     EGFR 34.2    Basic Metabolic Panel [[id number]]  (Abnormal) Collected: 07/09/19 1727    Specimen: Blood Updated: 07/09/19 1758     Glucose 99 mg/dL      BUN 53.6 mg/dL      Creatinine 2.7 mg/dL      Calcium 64.4 mg/dL      Sodium 034 mEq/L      Potassium 4.7 mEq/L      Chloride 97 mEq/L      CO2 23 mEq/L      Anion Gap 17.0    Hepatitis B (HBV) Core antibody, IgM [[id number]] Collected: 07/09/19 1212    Specimen: Blood Updated: 07/09/19 1515     Hepatitis B Core IgM Nonreactive    Syphilis Screen IgG and IgM [[id number]] Collected: 07/09/19 1212     Updated: 07/09/19 1514     Syphilis Screen IgG and IgM Nonreactive    Hepatitis C (HCV) antibody, Total [[id number]] Collected: 07/09/19 1212    Specimen: Blood Updated: 07/09/19 1514     Hepatitis C, AB Non-Reactive    Hepatitis A antibody, IgM [[id number]] Collected: 07/09/19 1212    Specimen: Blood Updated: 07/09/19 1514     Hep A IgM Nonreactive    Hepatitis B (HBV) Surface Antigen [[id number]] Collected: 07/09/19  1212    Specimen: Blood Updated: 07/09/19 1514     Hepatitis B Surface Antigen Non-Reactive    C4 complement [[id number]] Collected: 07/09/19 1212    Specimen: Blood Updated: 07/09/19 1452     C4 Complement 28.8 mg/dL     C3 Complement [[id number]] Collected: 07/09/19 1212    Specimen: Blood Updated: 07/09/19 1452     C3 Complement 131 mg/dL     Hemolysis index [[id number]]  (Abnormal) Collected: 07/09/19 1212     Updated: 07/09/19 1444     Hemolysis Index  59    Sedimentation rate (ESR) [[id number]] Collected: 07/09/19 1212    Specimen: Blood Updated: 07/09/19 1257     Sed Rate 4 mm/Hr     Lipase [[id number]] Collected: 07/09/19 1212    Specimen: Blood Updated: 07/09/19 1242     Lipase 70 U/L     Creatine Kinase (CK) [[id number]]  (Abnormal) Collected: 07/09/19 1212    Specimen: Blood Updated: 07/09/19 1242     Creatine Kinase (CK) 1,297 U/L     C Reactive Protein [[id number]] Collected: 07/09/19 1212    Specimen: Blood Updated: 07/09/19 1242     C-Reactive Protein <0.1 mg/dL     UA Reflex to Micro - Reflex to Culture [[id number]]  (Abnormal) Collected: 07/09/19 0425    Specimen: Urine, Clean Catch Updated: 07/09/19 0518     Urine Type Urine, Clean Ca     Color, UA Amber     Clarity, UA Cloudy     Specific Gravity UA 1.026     Urine pH 5.0     Leukocyte Esterase, UA Negative     Nitrite, UA Negative     Protein, UR >300     Glucose, UA Negative     Ketones UA Negative     Urobilinogen, UA Normal mg/dL      Bilirubin, UA Negative     Blood, UA Small     RBC, UA 0 - 2 /hpf      WBC, UA 0 - 5 /hpf      Squamous Epithelial Cells, Urine 0 - 5 /hpf      Hyaline Casts, UA 11 - 25 /lpf      Urine Mucus Present     Oval Fat Bodies, UA Rare /hpf     Narrative:      Replace urinary catheter prior to obtaining the urine culture  if it has been in place for greater than or equal to 14  days:->N/A No Foley  Indications for U/A Reflex to Micro - Reflex to  Culture:->Suprapubic Pain/Tenderness or Dysuria    Rapid drug screen, urine [[id number]]  (Abnormal) Collected: 07/09/19 0426    Specimen: Urine Updated: 07/09/19 0507     Urine Amphetamine Screen Negative     Barbiturate Screen, UR Negative     Benzodiazepine Screen, UR Negative     Cannabinoid Screen, UR Positive     Cocaine, UR Negative     Opiate Screen, UR Positive     PCP Screen, UR Negative    PTH, Intact [[id number]]  (Abnormal) Collected: 07/09/19 0125    Specimen: Blood Updated: 07/09/19 0416     PTH Intact 141.2 pg/mL     HIV  Ag/Ab 4th generation [[id number]] Collected: 07/09/19 0125     Updated: 07/09/19 0416     HIV Ag/Ab, 4th Generation Non-Reactive    CBC and differential [[id number]]  (Abnormal) Collected: 07/09/19 0143    Specimen: Blood Updated: 07/09/19 0217  WBC 19.27 x10 3/uL      Hgb 18.3 g/dL      Hematocrit 76.2 %      Platelets 286 x10 3/uL      RBC 6.02 x10 6/uL      MCV 89.9 fL      MCH 30.4 pg      MCHC 33.8 g/dL      RDW 14 %      MPV 11.5 fL      Neutrophils 84.1 %      Lymphocytes Automated 10.5 %      Monocytes 4.8 %      Eosinophils Automated 0.0 %      Basophils Automated 0.2 %      Immature Granulocytes 0.4 %      Nucleated RBC 0.0 /100 WBC      Neutrophils Absolute 16.22 x10 3/uL      Lymphocytes Absolute Automated 2.02 x10 3/uL      Monocytes Absolute Automated 0.93 x10 3/uL      Eosinophils Absolute Automated 0.00 x10 3/uL      Basophils Absolute Automated 0.03 x10 3/uL      Immature Granulocytes Absolute 0.07 x10 3/uL      Absolute NRBC 0.00 x10 3/uL     Calcium, ionized [[id number]] Collected: 07/09/19 0206    Specimen: Blood Updated: 07/09/19 0206    Narrative:      SST also acceptable    Blood gas, venous [[id number]] Collected: 07/09/19 0143    Specimen: Blood Updated: 07/09/19 0156     pH, Ven 7.342     pCO2, Venous 42.0 mmHg      pO2, Venous 171.0 mmHg      HCO3, Ven 22.2 mEq/L      Venous Total CO2 23.5 mEq/L      Base Excess, Ven -3.0 mEq/L      O2 Sat, Venous 98.6 %      Temperature 37.0    TSH [[id number]] Collected: 07/09/19 0104    Specimen: Blood Updated: 07/09/19 0153     TSH 0.37 uIU/mL     Creatine Kinase (CK) [[id number]]  (Abnormal) Collected: 07/09/19 0104    Specimen: Blood Updated: 07/09/19 0137     Creatine Kinase (CK) 1,263 U/L     GFR [[id number]] Collected: 07/09/19 0104     Updated: 07/09/19 0137     EGFR 18.5    Basic Metabolic Panel [[id number]]  (Abnormal) Collected: 07/09/19 0104    Specimen: Blood Updated: 07/09/19 0137     Glucose 114 mg/dL      BUN 51.0 mg/dL      Creatinine 4.6  mg/dL      Calcium 25.8 mg/dL      Sodium 527 mEq/L      Potassium 4.2 mEq/L      Chloride 104 mEq/L      CO2 18 mEq/L      Anion Gap 18.0    Magnesium [[id number]] Collected: 07/09/19 0104    Specimen: Blood Updated: 07/09/19 0137     Magnesium 2.2 mg/dL     Phosphorus [[id number]]  (Abnormal) Collected: 07/09/19 0104    Specimen: Blood Updated: 07/09/19 0137     Phosphorus 6.3 mg/dL     Osmolality [[id number]]  (Abnormal) Collected: 07/08/19 2212    Specimen: Blood Updated: 07/09/19 0034     Osmolality 311 mosm/kg     Lactic Acid [[id number]] Collected: 07/08/19 2349    Specimen: Blood Updated: 07/09/19 0000     Lactic Acid 1.2  mmol/L     GFR [[id number]] Collected: 07/08/19 2212     Updated: 07/08/19 2240     EGFR 16.1    Comprehensive metabolic panel [[id number]]  (Abnormal) Collected: 07/08/19 2212    Specimen: Blood Updated: 07/08/19 2240     Glucose 110 mg/dL      BUN 78.2 mg/dL      Creatinine 5.2 mg/dL      Sodium 956 mEq/L      Potassium 4.8 mEq/L      Chloride 98 mEq/L      CO2 18 mEq/L      Calcium 12.0 mg/dL      Protein, Total 21.3 g/dL      Albumin 6.3 g/dL      AST (SGOT) 38 U/L      ALT 33 U/L      Alkaline Phosphatase 80 U/L      Bilirubin, Total 1.2 mg/dL      Globulin 4.3 g/dL      Albumin/Globulin Ratio 1.5     Anion Gap 22.0    Lipase [[id number]] Collected: 07/08/19 2212    Specimen: Blood Updated: 07/08/19 2240     Lipase 58 U/L     CBC and differential [[id number]]  (Abnormal) Collected: 07/08/19 2212    Specimen: Blood Updated: 07/08/19 2233     WBC 19.75 x10 3/uL      Hgb 19.8 g/dL      Hematocrit 24.4 %      Platelets 330 x10 3/uL      RBC 6.39 x10 6/uL      MCV 88.6 fL      MCH 31.0 pg      MCHC 35.0 g/dL      RDW 15 %      MPV 11.9 fL      Neutrophils 83.7 %      Lymphocytes Automated 9.7 %      Monocytes 5.3 %      Eosinophils Automated 0.1 %      Basophils Automated 0.4 %      Immature Granulocytes 0.8 %      Nucleated RBC 0.0 /100 WBC      Neutrophils Absolute 16.57 x10 3/uL      Lymphocytes  Absolute Automated 1.91 x10 3/uL      Monocytes Absolute Automated 1.04 x10 3/uL      Eosinophils Absolute Automated 0.01 x10 3/uL      Basophils Absolute Automated 0.07 x10 3/uL      Immature Granulocytes Absolute 0.15 x10 3/uL      Absolute NRBC 0.00 x10 3/uL           Procedures/Radiology performed:   Radiology: all results from this admission  Ct Abd/ Pelvis Without Contrast    Result Date: 07/08/2019   CT scan of the abdomen and pelvis shows no acute abnormality. The examination shows no definite explanation for the reported symptoms. Note the limitations of this study which was performed without contrast. There is no evidence of bowel obstruction. No renal or ureteral calculi are seen and there is no evidence of obstructive uropathy. Miguel Dibble, MD  07/08/2019 11:15 PM    Xr Chest 2 Views    Result Date: 07/09/2019   No acute cardiopulmonary abnormality. Eloise Harman, MD  07/09/2019 1:43 AM    US Renal Kidney    Result Date: 07/10/2019  Mildly increased renal cortical echogenicity suggestive of medical renal disease. No hydronephrosis.  Eloise Harman, MD  07/10/2019 12:09 AM  Xr Chest Ap Portable    Result Date: 07/13/2019  . Hyperinflation with no focal pulmonary opacity Marty Heck, MD  07/13/2019 1:14 PM       Hospital Course:     Reason for admission/ HPI: Acute abdominal pain with ongoing nausea and emesis      Hospital Course:   28 year old male with history of Traumatic brain injury, bipolar disorder, marijuana use, cannabis hyperemesis syndrome who presents with severe abdominal pain, nausea, and vomiting after cessation of marijuana use 5 days ago found to have tachypnea and tachycardia with signficant leukocytosis 19k, acute renale failure with Cr. 5.2 with anion gap metabolic acidosis (AG 22). In the setting of urine protein/creatinine ratio of 0.4 with CK of >1400 continued improvement of creatinine in setting of aggressive rehydration most consistent with rhabdomyolysis likely secondary to ongoing  vomiting and severely decreased PO. At time of discharge abdominal pain had largely resolved and creatinine function had returned towards baseline with most recent being 1.5.      Abdominal pain, nausea, and vomiting: Most likely consistent with acute hyperemesis syndrome in the setting of of recent marijuana use and history vs viral/bacterial colitis in the setting of leukocytosis however negative CT abdomen and lack of fever make an infectious process less likely.   -CT Abdomen/Pelvis performed showing no evidence of obstruction, colitis, or other GI pathology   -Continued IV fluids (plasmalyte 125 cc/hr) throughout hospital course with return to appropriate PO prior to discharge.     #Acute kidney injury: Concerning for rhabdomysis in the setting of elevated CK. Continued rule out for nephrotic syndrome (etiology unclear FSGS vs membranous vs minimal change disease) with protein >300 in UA and markedly elevated Cr (5.2) all less likely.   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 With small 2x2 cm areas of flat, white, and well circumscribed lesion on the sole of right foot and midfoot of left foot      Consultations:     Treatment Team:   Consulting Physician: Barbette Hair, MD  Resident: Alanson Puls, MD  Resident: Bari Mantis, MD  Resident: Delora Fuel, MD  Consulting Physician: Gentry Roch, MD    Discharge Condition:     Stable     Discharge Instructions & Follow Up Plan for Patient:     Diet: Regular Diet     Activity/Weight Bearing Status: No change to activity  level       Patient was instructed to follow up with:     Follow-up Information     Glenard Haring, MD. Schedule an appointment as soon as possible for a visit.    Specialties: Nephrology, Internal Medicine  Why: please follow up within 1 week for finalized kidney results and further managment of you kidneys   Contact information:  [address]  [address]  [place] [address]  [phone]             [facility]. Schedule an appointment as soon as possible for a visit.    Why: Please follow up within 1 week after discharge   Contact information:  [address], [address]  [place] [address]  [phone]           Pcp, None, MD .                   Discharge Code Status: Full Code   Patient Emergency Contact:Extended Emergency Contact Information  Primary Emergency Contact: Nicole [place]  Home Phone: [phone]  Mobile Phone: [phone]  Relation: Mother    Complete instructions and follow up are in the patient's After Visit Summary    Discharge Medications:        Discharge Medication List      Taking    acetaminophen 325 MG tablet  Dose: 650 mg  Commonly known as: TYLENOL  Take 2 tablets (650 mg total) by mouth every 4 (four) hours as needed for Pain        Stop taking these medications    ondansetron 4 MG tablet   Commonly known as: [facility]   Department of Medicine   P: [phone]   F: [phone]    Signed by: Alanson Puls, MD    CC: Pcp, None, MD

## 2019-07-31 ENCOUNTER — Telehealth (INDEPENDENT_AMBULATORY_CARE_PROVIDER_SITE_OTHER): Payer: Medicaid HMO | Admitting: Nurse Practitioner

## 2019-07-31 ENCOUNTER — Ambulatory Visit (INDEPENDENT_AMBULATORY_CARE_PROVIDER_SITE_OTHER): Payer: Medicaid HMO | Admitting: Hospitalist

## 2019-12-01 ENCOUNTER — Observation Stay
Admission: EM | Admit: 2019-12-01 | Discharge: 2019-12-02 | Disposition: A | Discharge status: Home / Self Care | Payer: Self-pay | Attending: Hospitalist | Admitting: Hospitalist

## 2019-12-01 ENCOUNTER — Other Ambulatory Visit: Payer: Self-pay

## 2019-12-01 ENCOUNTER — Observation Stay: Payer: Self-pay

## 2019-12-01 DIAGNOSIS — R079 Chest pain, unspecified: Secondary | ICD-10-CM

## 2019-12-01 DIAGNOSIS — F17210 Nicotine dependence, cigarettes, uncomplicated: Secondary | ICD-10-CM | POA: Insufficient documentation

## 2019-12-01 DIAGNOSIS — F1290 Cannabis use, unspecified, uncomplicated: Secondary | ICD-10-CM | POA: Insufficient documentation

## 2019-12-01 DIAGNOSIS — F1010 Alcohol abuse, uncomplicated: Secondary | ICD-10-CM | POA: Insufficient documentation

## 2019-12-01 DIAGNOSIS — N179 Acute kidney failure, unspecified: Secondary | ICD-10-CM

## 2019-12-01 DIAGNOSIS — E872 Acidosis: Secondary | ICD-10-CM | POA: Insufficient documentation

## 2019-12-01 DIAGNOSIS — D72829 Elevated white blood cell count, unspecified: Secondary | ICD-10-CM | POA: Insufficient documentation

## 2019-12-01 DIAGNOSIS — I248 Other forms of acute ischemic heart disease: Principal | ICD-10-CM | POA: Insufficient documentation

## 2019-12-01 DIAGNOSIS — N170 Acute kidney failure with tubular necrosis: Secondary | ICD-10-CM | POA: Insufficient documentation

## 2019-12-01 DIAGNOSIS — E860 Dehydration: Secondary | ICD-10-CM | POA: Insufficient documentation

## 2019-12-01 DIAGNOSIS — D751 Secondary polycythemia: Secondary | ICD-10-CM | POA: Insufficient documentation

## 2019-12-01 DIAGNOSIS — E870 Hyperosmolality and hypernatremia: Secondary | ICD-10-CM | POA: Insufficient documentation

## 2019-12-01 DIAGNOSIS — R112 Nausea with vomiting, unspecified: Secondary | ICD-10-CM | POA: Insufficient documentation

## 2019-12-01 DIAGNOSIS — R1013 Epigastric pain: Secondary | ICD-10-CM | POA: Insufficient documentation

## 2019-12-01 LAB — CBC AND DIFFERENTIAL
Absolute NRBC: 0 10*3/uL (ref 0.00–0.00)
Basophils Absolute Automated: 0.08 10*3/uL (ref 0.00–0.08)
Basophils Automated: 0.3 %
Eosinophils Absolute Automated: 0 10*3/uL (ref 0.00–0.44)
Eosinophils Automated: 0 %
Hematocrit: 56.6 % — ABNORMAL HIGH (ref 37.6–49.6)
Hgb: 19.2 g/dL — ABNORMAL HIGH (ref 12.5–17.1)
Immature Granulocytes Absolute: 0.23 10*3/uL — ABNORMAL HIGH (ref 0.00–0.07)
Immature Granulocytes: 0.9 %
Lymphocytes Absolute Automated: 1.34 10*3/uL (ref 0.42–3.22)
Lymphocytes Automated: 5 %
MCH: 30.6 pg (ref 25.1–33.5)
MCHC: 33.9 g/dL (ref 31.5–35.8)
MCV: 90.3 fL (ref 78.0–96.0)
MPV: 12 fL (ref 8.9–12.5)
Monocytes Absolute Automated: 1.71 10*3/uL — ABNORMAL HIGH (ref 0.21–0.85)
Monocytes: 6.4 %
Neutrophils Absolute: 23.48 10*3/uL — ABNORMAL HIGH (ref 1.10–6.33)
Neutrophils: 87.4 %
Nucleated RBC: 0 /100 WBC (ref 0.0–0.0)
Platelets: 323 10*3/uL (ref 142–346)
RBC: 6.27 10*6/uL — ABNORMAL HIGH (ref 4.20–5.90)
RDW: 15 % (ref 11–15)
WBC: 26.84 10*3/uL — ABNORMAL HIGH (ref 3.10–9.50)

## 2019-12-01 LAB — ECG 12-LEAD
Atrial Rate: 105 {beats}/min
P Axis: 83 degrees
P-R Interval: 122 ms
Q-T Interval: 328 ms
QRS Duration: 120 ms
QTC Calculation (Bezet): 433 ms
R Axis: 91 degrees
T Axis: 71 degrees
Ventricular Rate: 105 {beats}/min

## 2019-12-01 LAB — I-STAT EG7 VENOUS CARTRIDGE
Hematocrit I-Stat: 61 % — ABNORMAL HIGH (ref 42.0–52.0)
Hemoglobin I-Stat: 20.7 g/dL — ABNORMAL HIGH (ref 13.0–17.0)
Potassium I-Stat: 5.7 mEq/L — ABNORMAL HIGH (ref 3.5–4.9)
Sodium I-Stat: 147 mEq/L — ABNORMAL HIGH (ref 138–146)
i-STAT Base Excess Venous: 1 mEq/L
i-STAT Calcium Ionized: 2.3 mEq/L (ref 2.24–2.64)
i-STAT FIO2: 21
i-STAT HCO3 Bicarbonate Venous: 28.8 mEq/L
i-STAT O2 Saturation Venous: 53 %
i-STAT Patient Temperature: 98
i-STAT Total CO2 Venous: 30 mEq/L — ABNORMAL HIGH (ref 24.0–29.0)
i-STAT pCO2 Venous: 53.7
i-STAT pH Venous: 7.336
i-STAT pO2 Venous: 30

## 2019-12-01 LAB — URINALYSIS, REFLEX TO MICROSCOPIC EXAM IF INDICATED
Bilirubin, UA: NEGATIVE
Glucose, UA: NEGATIVE
Ketones UA: 5 — AB
Leukocyte Esterase, UA: NEGATIVE
Nitrite, UA: NEGATIVE
Protein, UR: 100 — AB
Specific Gravity UA: 1.018 (ref 1.001–1.035)
Urine pH: 5 (ref 5.0–8.0)
Urobilinogen, UA: NORMAL mg/dL (ref 0.2–2.0)

## 2019-12-01 LAB — COMPREHENSIVE METABOLIC PANEL
ALT: 44 U/L (ref 0–55)
AST (SGOT): 38 U/L — ABNORMAL HIGH (ref 5–34)
Albumin/Globulin Ratio: 1.7 (ref 0.9–2.2)
Albumin: 6.8 g/dL — ABNORMAL HIGH (ref 3.5–5.0)
Alkaline Phosphatase: 86 U/L (ref 38–106)
Anion Gap: 23 — ABNORMAL HIGH (ref 5.0–15.0)
BUN: 45 mg/dL — ABNORMAL HIGH (ref 9.0–28.0)
Bilirubin, Total: 0.8 mg/dL (ref 0.2–1.2)
CO2: 24 mEq/L (ref 22–29)
Calcium: 11.6 mg/dL — ABNORMAL HIGH (ref 8.5–10.5)
Chloride: 100 mEq/L (ref 100–111)
Creatinine: 5.7 mg/dL — ABNORMAL HIGH (ref 0.7–1.3)
Globulin: 3.9 g/dL — ABNORMAL HIGH (ref 2.0–3.6)
Glucose: 103 mg/dL — ABNORMAL HIGH (ref 70–100)
Potassium: 5.1 mEq/L (ref 3.5–5.1)
Protein, Total: 10.7 g/dL — ABNORMAL HIGH (ref 6.0–8.3)
Sodium: 147 mEq/L — ABNORMAL HIGH (ref 136–145)

## 2019-12-01 LAB — RAPID DRUG SCREEN, URINE
Barbiturate Screen, UR: NEGATIVE
Benzodiazepine Screen, UR: NEGATIVE
Cannabinoid Screen, UR: POSITIVE — AB
Cocaine, UR: NEGATIVE
Opiate Screen, UR: NEGATIVE
PCP Screen, UR: NEGATIVE
Urine Amphetamine Screen: NEGATIVE

## 2019-12-01 LAB — LIPASE: Lipase: 78 U/L (ref 8–78)

## 2019-12-01 LAB — TROPONIN I
Troponin I: 0.38 ng/mL (ref 0.00–0.05)
Troponin I: 0.23 ng/mL — ABNORMAL HIGH (ref 0.00–0.05)

## 2019-12-01 LAB — LACTIC ACID, PLASMA
Lactic Acid: 2.8 mmol/L — ABNORMAL HIGH (ref 0.2–2.0)
Lactic Acid: 2 mmol/L (ref 0.2–2.0)

## 2019-12-01 LAB — CK: Creatine Kinase (CK): 772 U/L — ABNORMAL HIGH (ref 47–267)

## 2019-12-01 LAB — GFR: EGFR: 14.4

## 2019-12-01 MED ORDER — SODIUM CHLORIDE 0.9 % IV BOLUS
1000.00 mL | Freq: Once | INTRAVENOUS | Status: AC
Start: 2019-12-01 — End: 2019-12-01
  Administered 2019-12-01: 16:00:00 1000 mL via INTRAVENOUS

## 2019-12-01 MED ORDER — THIAMINE (VITAMIN B1) 100 MG PO TABS (WRAP)
100.0000 mg | ORAL_TABLET | Freq: Every day | ORAL | Status: DC
Start: 2019-12-01 — End: 2019-12-02
  Administered 2019-12-01 – 2019-12-02 (×2): 100 mg via ORAL
  Filled 2019-12-01 (×2): qty 1

## 2019-12-01 MED ORDER — TAB-A-VITE/BETA CAROTENE PO TABS
1.0000 | ORAL_TABLET | Freq: Every day | ORAL | Status: DC
Start: 2019-12-01 — End: 2019-12-02
  Administered 2019-12-01 – 2019-12-02 (×2): 1 via ORAL
  Filled 2019-12-01 (×2): qty 1

## 2019-12-01 MED ORDER — ONDANSETRON HCL 4 MG/2ML IJ SOLN
4.00 mg | Freq: Four times a day (QID) | INTRAMUSCULAR | Status: DC | PRN
Start: 2019-12-01 — End: 2019-12-02
  Administered 2019-12-02: 05:00:00 4 mg via INTRAVENOUS
  Filled 2019-12-01: qty 2

## 2019-12-01 MED ORDER — LACTATED RINGERS IV SOLN
INTRAVENOUS | Status: DC
Start: 2019-12-01 — End: 2019-12-02

## 2019-12-01 MED ORDER — HEPARIN SODIUM (PORCINE) 5000 UNIT/ML IJ SOLN
5000.00 [IU] | Freq: Three times a day (TID) | INTRAMUSCULAR | Status: DC
Start: 2019-12-01 — End: 2019-12-02
  Administered 2019-12-01 – 2019-12-02 (×2): 5000 [IU] via SUBCUTANEOUS
  Filled 2019-12-01 (×2): qty 1

## 2019-12-01 MED ORDER — ONDANSETRON 4 MG PO TBDP
4.00 mg | ORAL_TABLET | Freq: Four times a day (QID) | ORAL | Status: DC | PRN
Start: 2019-12-01 — End: 2019-12-02

## 2019-12-01 MED ORDER — FOLIC ACID 1 MG PO TABS
1.0000 mg | ORAL_TABLET | Freq: Every day | ORAL | Status: DC
Start: 2019-12-01 — End: 2019-12-02
  Administered 2019-12-01 – 2019-12-02 (×2): 1 mg via ORAL
  Filled 2019-12-01 (×2): qty 1

## 2019-12-01 MED ORDER — LACTATED RINGERS IV BOLUS
1000.00 mL | Freq: Once | INTRAVENOUS | Status: AC
Start: 2019-12-01 — End: 2019-12-01
  Administered 2019-12-01: 17:00:00 1000 mL via INTRAVENOUS

## 2019-12-01 MED ORDER — ACETAMINOPHEN 325 MG PO TABS
650.0000 mg | ORAL_TABLET | Freq: Four times a day (QID) | ORAL | Status: DC | PRN
Start: 2019-12-01 — End: 2019-12-02

## 2019-12-01 MED ORDER — NALOXONE HCL 0.4 MG/ML IJ SOLN (WRAP)
0.20 mg | INTRAMUSCULAR | Status: DC | PRN
Start: 2019-12-01 — End: 2019-12-02

## 2019-12-01 MED ORDER — ONDANSETRON HCL 4 MG/2ML IJ SOLN
4.00 mg | Freq: Once | INTRAMUSCULAR | Status: AC
Start: 2019-12-01 — End: 2019-12-01
  Administered 2019-12-01: 16:00:00 4 mg via INTRAVENOUS
  Filled 2019-12-01: qty 2

## 2019-12-01 MED ORDER — ACETAMINOPHEN 650 MG RE SUPP
650.00 mg | Freq: Four times a day (QID) | RECTAL | Status: DC | PRN
Start: 2019-12-01 — End: 2019-12-02

## 2019-12-01 MED ORDER — ENOXAPARIN SODIUM 40 MG/0.4ML SC SOLN
40.00 mg | Freq: Every day | SUBCUTANEOUS | Status: DC
Start: 2019-12-02 — End: 2019-12-01

## 2019-12-01 MED ORDER — LACTATED RINGERS IV SOLN
INTRAVENOUS | Status: DC
Start: 2019-12-01 — End: 2019-12-01
  Administered 2019-12-01: 17:00:00 1000 mL via INTRAVENOUS
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
## 2019-12-01 NOTE — ED Notes (Signed)
Writer spoke to lab processing to inquire about add-on CK that has not been processed yet. State that they will begin processing now and should result in approx. 15 min.

## 2019-12-01 NOTE — ED Notes (Signed)
Bed: S 13  Expected date:   Expected time:   Means of arrival:   Comments:  D here

## 2019-12-01 NOTE — Progress Notes (Signed)
12/01/19 1909   CM Review   Acknowledgment of Outpatient/Observation Observation letter given       PPE    [x]  Procedural Mask    []  Respirator    []  Gloves    []  Gown    []  Goggles or Face Shield

## 2019-12-01 NOTE — ED Notes (Signed)
[facility] EMERGENCY DEPT  ED NURSING NOTE FOR THE RECEIVING INPATIENT NURSE   ED NURSE Harland German   [facility] [id number], [phone]   ED CHARGE RN [phone]   ADMISSION INFORMATION   Joshwa Hemric. is a 29 y.o. male admitted with an ED diagnosis of:    1. Acute renal failure         Isolation: None   Allergies: Pollen extract   Holding Orders confirmed? Yes   Belongings Documented? Yes   Home medications sent to pharmacy confirmed? N/A   NURSING CARE   Patient Comes From:   Mental Status: Home Independent  alert and oriented   ADL: Independent with all ADLs   Ambulation: no difficulty   Pertinent Information  and Safety Concerns: patient on CIWA protocol but denies regular drinking. states he binges every 3 months. similar situation has happened previously, can be found in chart. patient drinks heavily, becomes dehydrated, gets rhabdo/AKI. he is alert and oriented and not complaining of any discomort at this time.      CT / NIH   CT Head ordered on this patient?  N/A   NIH/Dysphagia assessment done prior to admission? N/A   VITAL SIGNS (at the time of this note)      Vitals:    12/01/19 1840   BP: 145/85   Pulse: 93   Resp: 15   Temp: 97.7 F (36.5 C)   SpO2: 98%
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
## 2019-12-02 ENCOUNTER — Observation Stay: Payer: Self-pay

## 2019-12-02 DIAGNOSIS — R7989 Other specified abnormal findings of blood chemistry: Secondary | ICD-10-CM

## 2019-12-02 DIAGNOSIS — E870 Hyperosmolality and hypernatremia: Secondary | ICD-10-CM

## 2019-12-02 DIAGNOSIS — R1013 Epigastric pain: Secondary | ICD-10-CM

## 2019-12-02 DIAGNOSIS — M6282 Rhabdomyolysis: Secondary | ICD-10-CM

## 2019-12-02 DIAGNOSIS — D72829 Elevated white blood cell count, unspecified: Secondary | ICD-10-CM

## 2019-12-02 DIAGNOSIS — F1210 Cannabis abuse, uncomplicated: Secondary | ICD-10-CM

## 2019-12-02 DIAGNOSIS — F1010 Alcohol abuse, uncomplicated: Secondary | ICD-10-CM

## 2019-12-02 DIAGNOSIS — R45851 Suicidal ideations: Secondary | ICD-10-CM

## 2019-12-02 DIAGNOSIS — N179 Acute kidney failure, unspecified: Secondary | ICD-10-CM

## 2019-12-02 DIAGNOSIS — I248 Other forms of acute ischemic heart disease: Principal | ICD-10-CM

## 2019-12-02 LAB — BASIC METABOLIC PANEL
Anion Gap: 11 (ref 5.0–15.0)
BUN: 35 mg/dL — ABNORMAL HIGH (ref 9.0–28.0)
CO2: 25 mEq/L (ref 22–29)
Calcium: 9 mg/dL (ref 8.5–10.5)
Chloride: 107 mEq/L (ref 100–111)
Creatinine: 2.5 mg/dL — ABNORMAL HIGH (ref 0.7–1.3)
Glucose: 82 mg/dL (ref 70–100)
Potassium: 4.5 mEq/L (ref 3.5–5.1)
Sodium: 143 mEq/L (ref 136–145)

## 2019-12-02 LAB — CBC AND DIFFERENTIAL
Absolute NRBC: 0 10*3/uL (ref 0.00–0.00)
Basophils Absolute Automated: 0.06 10*3/uL (ref 0.00–0.08)
Basophils Automated: 0.3 %
Eosinophils Absolute Automated: 0 10*3/uL (ref 0.00–0.44)
Eosinophils Automated: 0 %
Hematocrit: 42.6 % (ref 37.6–49.6)
Hgb: 14.1 g/dL (ref 12.5–17.1)
Immature Granulocytes Absolute: 0.09 10*3/uL — ABNORMAL HIGH (ref 0.00–0.07)
Immature Granulocytes: 0.5 %
Lymphocytes Absolute Automated: 2.58 10*3/uL (ref 0.42–3.22)
Lymphocytes Automated: 12.9 %
MCH: 30.4 pg (ref 25.1–33.5)
MCHC: 33.1 g/dL (ref 31.5–35.8)
MCV: 91.8 fL (ref 78.0–96.0)
MPV: 12.1 fL (ref 8.9–12.5)
Monocytes Absolute Automated: 1.42 10*3/uL — ABNORMAL HIGH (ref 0.21–0.85)
Monocytes: 7.1 %
Neutrophils Absolute: 15.8 10*3/uL — ABNORMAL HIGH (ref 1.10–6.33)
Neutrophils: 79.2 %
Nucleated RBC: 0 /100 WBC (ref 0.0–0.0)
Platelets: 221 10*3/uL (ref 142–346)
RBC: 4.64 10*6/uL (ref 4.20–5.90)
RDW: 15 % (ref 11–15)
WBC: 19.95 10*3/uL — ABNORMAL HIGH (ref 3.10–9.50)

## 2019-12-02 LAB — URINALYSIS WITH MICROSCOPIC
Bilirubin, UA: NEGATIVE
Blood, UA: NEGATIVE
Glucose, UA: NEGATIVE
Ketones UA: NEGATIVE
Leukocyte Esterase, UA: NEGATIVE
Nitrite, UA: NEGATIVE
Protein, UR: NEGATIVE
Specific Gravity UA: 1.017 (ref 1.001–1.035)
Urine pH: 6 (ref 5.0–8.0)
Urobilinogen, UA: NORMAL mg/dL (ref 0.2–2.0)

## 2019-12-02 LAB — MAGNESIUM: Magnesium: 2.6 mg/dL (ref 1.6–2.6)

## 2019-12-02 LAB — GFR: EGFR: 37.3

## 2019-12-02 LAB — PROCALCITONIN: Procalcitonin: 0.26 — ABNORMAL HIGH (ref 0.00–0.10)

## 2019-12-02 LAB — PHOSPHORUS: Phosphorus: 3.3 mg/dL (ref 2.3–4.7)

## 2019-12-02 MED ORDER — CAPSAICIN 0.025 % EX CREA
TOPICAL_CREAM | Freq: Three times a day (TID) | CUTANEOUS | Status: DC
Start: 2019-12-02 — End: 2019-12-02
  Filled 2019-12-02: qty 60

## 2019-12-02 MED ORDER — LORAZEPAM 2 MG/ML IJ SOLN
1.00 mg | INTRAMUSCULAR | Status: DC | PRN
Start: 2019-12-02 — End: 2019-12-02
  Administered 2019-12-02: 05:00:00 2 mg via INTRAVENOUS
  Filled 2019-12-02: qty 1

## 2019-12-02 NOTE — Progress Notes (Signed)
Report taken from night shift. Pt is in shower and charge RN in the room with the pt.

## 2019-12-02 NOTE — Progress Notes (Addendum)
Night shift charge Charity fundraiser and day shift charge RN entered room d/t bed alarm going off. Found patient standing in bathroom, had removed tele and IV fluids and was attempting to shower. RN's discussed that patient cannot shower right now because continuous fluids and tele were required, and for safety he cannot be alone. RN's explained that if patient could wait 20 minutes until after shift change, we could check with MD if pt can come off tele and pause fluids and have staff member stay with him. Pt was not redirectable, would not sit down, continued to remove clothing and get in shower. Charge RN brought fall prevention contract to patient and discussed all safety strategies, patient refused all. Patient is a&ox4, refused all fall precautions despite education about safety, patient refused tele and IV fluids. Signed fall prevention contract voluntarily.  Pt was agreeable to resource RN Diona standing in room with patient during shower.     MD and NP made aware of patient's removing IV and tele. Sitter request faxed to nursing support.     0900 - Pt requesting another shower. Charge RN discussed again with patient the medical need for him to remain on tele and IVF. Pt stating "I need to wash my ass" and RN explained he just did that less than 1 hour ago. Patient becoming agitated, standing and coming close to RN, cursing and raising voice. RN offered medications, patient refused. Pt stating that he wants to leave AMA. NP and MD notified, IV's removed from patient for safety, MD and NP rounding in room.

## 2019-12-02 NOTE — Progress Notes (Signed)
ADULT OBSERVATION UNIT  DISCHARGE NOTE      Date Time: 12/02/19 10:12 AM  Patient Name: [name], Joel [name].  Attending Physician: No att. providers found      Date of Admission:   12/01/2019        Discharge Instructions:        Patient stable for discharge. Discharge instructions given, patient teachings done and verbalized understanding. All questions answered at this time. Aware to follow-up and schedule appointments as necessary. Discharge verbal and written  instruction to be given for mother .  IV access and cardiac monitor discontinued. Discharge to home   Wheelchair  transport requested. Pt off of the floor accompanied by RN and discharged without any difficulty.     Patient aware to follow-up with MD and PMD as needed.    Signed by: Dellis Filbert
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
## 2019-12-02 NOTE — Discharge Instr - AVS First Page (Addendum)
Reason for your Hospital Admission:  Abdominal pain, nausea, and vomiting. You were found to be dehydrated with signs of kidney injury and heart strain.         Instructions for after your discharge:  1.  Follow up with your primary care provider - at [facility] with Dr. Einar Gip. You will need blood work done in the next few days.   2.  Stop using marijuana and alcohol.  Seek resources to help you do this.  I have provided the CATs contact information. If you continue to drink, please do not drive while drinking.   3.  Stay well hydrated with non-alcoholic substances.  Resume a well balanced diet and activity as tolerated.   4.  Follow up with cardiology as an outpatient in 1-2 weeks.     Return to the nearest emergency department for chest pain, shortness of breath, fever >100.5 degrees, dizziness, syncope, profuse vomiting or diarrhea, or any other concerning symptoms.
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
## 2019-12-02 NOTE — Plan of Care (Signed)
NURSING ADMISSION NOTE    Date Time: 12/02/19 2:16 AM  Patient Name: Joel [name], Joel [name].  Attending Physician: Joanette Gula, MD    Date of Admission:   12/01/2019    Reason for Admission:   Acute renal failure [N17.9]    Admitted from:   ED    Nursing Note:   Patient AOx4, English speaking, and VSS. On tele, NSR, confirmed with [facility], and alerted Charge Nurse Aurea Graff. Oriented patient to the floor and POC. Bed to low position. Call bell within reach.     Patient scores as High Falls risk. Patient ambulates independently without assistive devices.  Bed alarm on and floor mats in place, seizure precautions in place. Patient belongings reviewed, home medications not at bedside. Patient skin intact with scattered tattoos, 4 eyes in 4 hours with RN Trinna Post. PIV (18 G LAC, 20 G RUE), flushed and infusing LR 250 mL/hr in LAC. Pt continent of bowel and bladder, LBM, per pt, was 11/28/2019 - denies abd pain or discomfort or constipation. CIWA score 0, will continue to monitor. Pt received shower upon arrival to unit. Patient reports pain as 0/10, safety interventions in place.    2330: Pt CIWA score 3 as pt was disoriented to situation when asked to explain reason for hospitalization. Pt stated "something with my dog" and continued with broken sentences that did not make sense. Pt requested cranberry juice and resumed watching TV. VSS at this time.    0327: Pt CIWA score 8 as pt was found to have disconnected his IVF, standing over the bed and requesting hot water and a shower. Consulting civil engineer and bedside RN attempted to explain to pt it will not be possible at this time and gave pt bath wipes. Pt was verbally aggressive and displeased with response. Pt resumed to laying down on the bed, VSS stable at this time.    1610: Pt CIWA score 16, with increased agitation and N/V. Pt had emesis occurrence and continued to request for a shower. Zofran and ativan 2 mg given. Pt stated he wants to leave and go home. SBP elevated (134/98).    0600:  Pt CIWA score 8, pt drowsy and sleepy. SBP elevated (136/91). N/V, anxiety and agitation has decreased. Will continue to monitor.    0700: Pt independently got up and was found in the shower. Pt refused all nursing interventions and was informed by Charge RN of safety precautions. Pt signed fall prevention contract, RN Diona remained in room with pt during shower.     Plan:  CIWA  Tele  IVF (LR 250 mL/hr)  Regular diet    Active Lines:    Patient Lines/Drains/Airways Status      Active Lines, Drains and Airways       Name:   Placement date:   Placement time:   Site:   Days:    Peripheral IV 12/01/19 18 G Left Antecubital   12/01/19    1540    Antecubital   less than 1    Peripheral IV 12/01/19 20 G Right Upper Arm   12/01/19    1635    Upper Arm   less than 1                      Vitals:    12/01/19 1840 12/01/19 2015 12/01/19 2050 12/01/19 2330   BP: 145/85 118/86 118/76 100/64   Pulse: 93 89 67 76   Resp: 15  18 17    Temp:  97.7 F (36.5 C)  98.2 F (36.8 C) 98.4 F (36.9 C)   TempSrc: Oral  Oral Oral   SpO2: 98% 98% 98% 98%   Weight:       Height:            Problem: Safety  Goal: Patient will be free from injury during hospitalization  Outcome: Progressing  Flowsheets (Taken 12/01/2019 2200)  Patient will be free from injury during hospitalization:   Assess patient's risk for falls and implement fall prevention plan of care per policy   Provide and maintain safe environment   Use appropriate transfer methods   Ensure appropriate safety devices are available at the bedside   Include patient/ family/ care giver in decisions related to safety   Hourly rounding  Goal: Patient will be free from infection during hospitalization  Outcome: Progressing  Flowsheets (Taken 12/01/2019 0220)  Free from Infection during hospitalization:   Assess and monitor for signs and symptoms of infection   Monitor lab/diagnostic results   Monitor all insertion sites (i.e. indwelling lines, tubes, urinary catheters, and drains)    Encourage patient and family to use good hand hygiene technique     Problem: Pain  Goal: Pain at adequate level as identified by patient  Outcome: Progressing  Flowsheets (Taken 12/01/2019 2200)  Pain at adequate level as identified by patient:   Identify patient comfort function goal   Assess for risk of opioid induced respiratory depression, including snoring/sleep apnea. Alert healthcare team of risk factors identified.   Assess pain on admission, during daily assessment and/or before any "as needed" intervention(s)   Reassess pain within 30-60 minutes of any procedure/intervention, per Pain Assessment, Intervention, Reassessment (AIR) Cycle   Evaluate if patient comfort function goal is met   Evaluate patient's satisfaction with pain management progress   Offer non-pharmacological pain management interventions   Include patient/patient care companion in decisions related to pain management as needed     Problem: Side Effects from Pain Analgesia  Goal: Patient will experience minimal side effects of analgesic therapy  Outcome: Progressing  Flowsheets (Taken 12/01/2019 2200)  Patient will experience minimal side effects of analgesic therapy:   Assess for changes in cognitive function   Monitor/assess patient's respiratory status (RR depth, effort, breath sounds)     Problem: Discharge Barriers  Goal: Patient will be discharged home or other facility with appropriate resources  Outcome: Progressing  Flowsheets (Taken 12/01/2019 2200)  Discharge to home or other facility with appropriate resources:   Provide appropriate patient education   Provide information on available health resources     Problem: Psychosocial and Spiritual Needs  Goal: Demonstrates ability to cope with hospitalization/illness  Outcome: Progressing  Flowsheets (Taken 12/01/2019 2200)  Demonstrates ability to cope with hospitalizations/illness:   Encourage verbalization of feelings/concerns/expectations   Provide quiet environment   Assist  patient to identify own strengths and abilities   Encourage patient to set small goals for self   Encourage participation in diversional activity   Include patient/ patient care companion in decisions     Problem: Moderate/High Fall Risk Score >5  Goal: Patient will remain free of falls  Outcome: Progressing  Flowsheets (Taken 12/01/2019 2200)  High (Greater than 13):   HIGH-Bed alarm on at all times while patient in bed   HIGH-Apply yellow "Fall Risk" arm band   HIGH-Initiate use of floor mats as appropriate   HIGH-Visual cue at entrance to patient's room   HIGH-Utilize chair pad alarm for patient while  in the chair   HIGH-Pharmacy to initiate evaluation and intervention per protocol   HIGH-Consider use of low bed     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 AS EVIDENCED BY:  Goal: Will identify long and short term goals based on individual needs and strengths  Outcome: Progressing  Flowsheets (Taken 12/01/2019 2200)  Pt will identify long and short term treatment goals based on individual needs and strengths:   Assist patient to define criteria for discharge   Assist to identify goals for treatment based on individual needs and strengths  Goal: Patient achieves a safe detoxification and management of withdrawal symptoms  Outcome: Progressing  Flowsheets (Taken 12/01/2019 2200)  Patient achieves a safe detoxification and management of withdrawal symptoms:   Assess withdrawal signs/symptoms according to identified protocol (e.g. CIWA/COWS)   Provide medication teaching including name, dosage, benefits, action, effect and side effects   Assess effectiveness (relief of withdrawal symptoms) and side effects of medication   Educate about health risks associated with withdrawal (e.g. seizures, DTs)   Ensure adequate hydration and nutrition during withdrawal   Educate about the importance of reporting dizziness or unsteadiness while ambulating to care providers (e.g. RN, LIP)  Goal: Patient  educated about the disease of addiction and recovery and identifies long-term goal  Outcome: Progressing  Flowsheets (Taken 12/01/2019 2200)  Patient educated about the disease of addiction and recovery and identifies long-term goal: Educate about the characteristics of alcohol/substance abuse/dependence  Goal: Patient develops a discharge plan  Outcome: Progressing  Flowsheets (Taken 12/01/2019 2200)  Patient develops a discharge plan: Review healthy diversion activities that promote abstinence from alcohol/substances     Problem: Renal Instability  Goal: Fluid and electrolyte balance are achieved/maintained  Outcome: Progressing  Flowsheets (Taken 12/01/2019 2200)  Fluid and electrolyte balance are achieved/maintained:   Monitor/assess lab values and report abnormal values   Provide adequate hydration   Assess for confusion/personality changes   Assess and reassess fluid and electrolyte status   Observe for seizure activity and initiate seizure precautions if indicated   Observe for cardiac arrhythmias   Monitor for muscle weakness  Goal: Nutritional intake is adequate  Outcome: Progressing  Flowsheets (Taken 12/01/2019 2200)  Nutritional intake is adequate:   Allow adequate time for meals   Assist patient with meals/food selection   Assess anorexia, appetite, and amount of meal/food tolerated   Include patient/patient care companion in decisions related to nutrition  Goal: Free from infection  Outcome: Progressing  Flowsheets (Taken 12/01/2019 2200)  Free from infection: Monitor/assess for signs and symptoms of infection

## 2019-12-03 ENCOUNTER — Emergency Department
Admission: EM | Admit: 2019-12-03 | Discharge: 2019-12-04 | Disposition: A | Discharge status: Home / Self Care | Payer: Self-pay | Attending: Emergency Medicine | Admitting: Emergency Medicine

## 2019-12-03 DIAGNOSIS — R112 Nausea with vomiting, unspecified: Secondary | ICD-10-CM | POA: Insufficient documentation

## 2019-12-03 DIAGNOSIS — E860 Dehydration: Secondary | ICD-10-CM

## 2019-12-03 DIAGNOSIS — N179 Acute kidney failure, unspecified: Secondary | ICD-10-CM | POA: Insufficient documentation

## 2019-12-03 LAB — CBC AND DIFFERENTIAL
Absolute NRBC: 0 10*3/uL (ref 0.00–0.00)
Basophils Absolute Automated: 0.04 10*3/uL (ref 0.00–0.08)
Basophils Automated: 0.3 %
Eosinophils Absolute Automated: 0 10*3/uL (ref 0.00–0.44)
Eosinophils Automated: 0 %
Hematocrit: 56 % — ABNORMAL HIGH (ref 37.6–49.6)
Hgb: 18.2 g/dL — ABNORMAL HIGH (ref 12.5–17.1)
Immature Granulocytes Absolute: 0.03 10*3/uL (ref 0.00–0.07)
Immature Granulocytes: 0.2 %
Lymphocytes Absolute Automated: 1.73 10*3/uL (ref 0.42–3.22)
Lymphocytes Automated: 12.9 %
MCH: 29.4 pg (ref 25.1–33.5)
MCHC: 32.5 g/dL (ref 31.5–35.8)
MCV: 90.6 fL (ref 78.0–96.0)
MPV: 11.6 fL (ref 8.9–12.5)
Monocytes Absolute Automated: 0.84 10*3/uL (ref 0.21–0.85)
Monocytes: 6.2 %
Neutrophils Absolute: 10.82 10*3/uL — ABNORMAL HIGH (ref 1.10–6.33)
Neutrophils: 80.4 %
Nucleated RBC: 0 /100 WBC (ref 0.0–0.0)
Platelets: 275 10*3/uL (ref 142–346)
RBC: 6.18 10*6/uL — ABNORMAL HIGH (ref 4.20–5.90)
RDW: 14 % (ref 11–15)
WBC: 13.46 10*3/uL — ABNORMAL HIGH (ref 3.10–9.50)

## 2019-12-03 LAB — COMPREHENSIVE METABOLIC PANEL
ALT: 46 U/L (ref 0–55)
AST (SGOT): 46 U/L — ABNORMAL HIGH (ref 5–34)
Albumin/Globulin Ratio: 1.5 (ref 0.9–2.2)
Albumin: 5.7 g/dL — ABNORMAL HIGH (ref 3.5–5.0)
Alkaline Phosphatase: 72 U/L (ref 38–106)
Anion Gap: 18 — ABNORMAL HIGH (ref 5.0–15.0)
BUN: 24 mg/dL (ref 9.0–28.0)
Bilirubin, Total: 1.1 mg/dL (ref 0.2–1.2)
CO2: 24 mEq/L (ref 22–29)
Calcium: 11.3 mg/dL — ABNORMAL HIGH (ref 8.5–10.5)
Chloride: 103 mEq/L (ref 100–111)
Creatinine: 1.7 mg/dL — ABNORMAL HIGH (ref 0.7–1.3)
Globulin: 3.9 g/dL — ABNORMAL HIGH (ref 2.0–3.6)
Glucose: 91 mg/dL (ref 70–100)
Potassium: 4.3 mEq/L (ref 3.5–5.1)
Protein, Total: 9.6 g/dL — ABNORMAL HIGH (ref 6.0–8.3)
Sodium: 145 mEq/L (ref 136–145)

## 2019-12-03 LAB — LIPASE: Lipase: 24 U/L (ref 8–78)

## 2019-12-03 LAB — GFR: EGFR: 58.2

## 2019-12-03 LAB — ETHANOL: Alcohol: NOT DETECTED mg/dL

## 2019-12-03 MED ORDER — ONDANSETRON HCL 4 MG/2ML IJ SOLN
8.00 mg | Freq: Once | INTRAMUSCULAR | Status: AC
Start: 2019-12-03 — End: 2019-12-03
  Administered 2019-12-03: 8 mg via INTRAVENOUS
  Filled 2019-12-03: qty 4

## 2019-12-03 MED ORDER — SODIUM CHLORIDE 0.9 % IV SOLN
12.5000 mg | Freq: Once | INTRAVENOUS | Status: AC
Start: 2019-12-04 — End: 2019-12-04
  Administered 2019-12-04: 12.5 mg via INTRAVENOUS
  Filled 2019-12-03: qty 1

## 2019-12-03 MED ORDER — SODIUM CHLORIDE 0.9 % IV BOLUS
1000.00 mL | Freq: Once | INTRAVENOUS | Status: AC
Start: 2019-12-03 — End: 2019-12-04
  Administered 2019-12-03: 1000 mL via INTRAVENOUS

## 2019-12-03 NOTE — ED Provider Notes (Signed)
[facility] EMERGENCY DEPARTMENT ATTENDING PHYSICIAN NOTE     CLINICAL SUMMARY          Diagnosis:    .     Final diagnoses:   Nausea and vomiting, intractability of vomiting not specified, unspecified vomiting type   Dehydration   AKI (acute kidney injury)         Disposition:       ED Disposition     ED Disposition Condition Date/Time Comment    Discharge  Tue Dec 04, 2019 12:18 AM Joel [name]. discharge to home/self care.    Condition at disposition: Stable             Discharge Prescriptions     Medication Sig Dispense Auth. Provider    promethazine (PHENERGAN) 12.5 MG tablet Take 1 tablet (12.5 mg total) by mouth every 8 (eight) hours as needed for Nausea 6 tablet Delorse Lek, MD                                            CLINICAL INFORMATION        HPI:         29 y.o. male p/w vomiting  Ongoing for a day  Moderate intensity  No alleviating factors  No urinary symptoms  Gradual onset    Admitted to medicine service for same a few days ago b/c he had renal failure that improved w/ IV fluids    History obtained from:  Patient          ROS:      Pertinent Positives and Negatives noted in the HPI.    All other systems reviewed and are negative        Physical Exam:      Pulse (!) 108   BP (!) 152/113   Resp 20   SpO2 100 %   Temp 98.6 F (37 C)    Nursing note and vitals reviewed.    Constitutional:  Uncomfortable. Full sentences.   Psych:  Normal affect. Cooperative.   Eyes: No conjunctival injection. No discharge.  Ears, Nose, Mouth and Throat:Tolerating secretions.  No hoarseness.  Respiratory: No respiratory distress. No retractions.    Cardiovascular: No murmurs. Regular rhythm.   Abdomen: Soft. Minimal epigastric tenderness. No peritoneal signs.   Musculoskeletal:           Neck:           Back:            Upper Extremity:           Lower Extremity:   Neurological: awake. Alert. Oriented. Clear speech.   Integumentary:   GU:   Lymphatic:                PAST HISTORY        Primary Care  Provider: Pcp, None, MD        PMH/PSH:      Joel [name]. 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  He has a past surgical history that includes Appendectomy.      Social/Family History:      He reports that he has quit smoking. His smoking use included cigarettes.  He has never used smokeless tobacco. He reports current alcohol use. He reports current drug use. Drug: Marijuana.  His family history is not on file.      Listed Medications on Arrival:    .     Home Medications     Med List Status: In Progress Set By: Novella Olive, RN at 12/03/2019 11:26 PM                acetaminophen (TYLENOL) 325 MG tablet     Take 2 tablets (650 mg total) by mouth every 4 (four) hours as needed for Pain     gabapentin (Neurontin) 100 MG capsule     Take 1 capsule (100 mg total) by mouth 3 (three) times daily     LORazepam (ATIVAN) 1 MG tablet     Take 0.5 tablets (0.5 mg total) by mouth 2 (two) times daily as needed (vomiting)     sertraline (ZOLOFT) 25 MG tablet     Take 1 tablet (25 mg total) by mouth daily     traZODone (DESYREL) 50 MG tablet     Take 1 tablet (50 mg total) by mouth nightly     vitamin D, ergocalciferol, (DRISDOL) 50000 UNIT Cap     Take 1 capsule (50,000 Units total) by mouth once a week        Allergies: He is allergic to pollen extract.            VISIT INFORMATION        Clinical Course & Medical Decision Making:      At 11:35 PM:  29 y.o. male p/w vomiting as described above. In ER, pt is afebrile, satting well on RA w/ a SBP of 150s and a HR of 100s.  Benign abdomen. Looks dry. Will give IV fluids and IV anti-emetics. He had an abdominal CT scan for this pain 2 days ago and it was negative. NPO. Re-eval.     At 12:27 AM:  Pt feels much better  Abdomen  remains soft and benign  Renal function improving (Cr 5 --> 1.7) over the past few days  Will d/c  ER return instructions given  Pt a/w plan.       Medications Given in the ED:    .     ED Medication Orders (From admission, onward)    Start Ordered     Status Ordering Provider    12/04/19 0030 12/03/19 2330  promethazine (PHENERGAN) 12.5 mg in sodium chloride 0.9 % 50 mL IVPB  Once     Route: Intravenous  Ordered Dose: 12.5 mg     Last MAR action: Given Wilbert Schouten    12/03/19 2331 12/03/19 2330  sodium chloride 0.9 % bolus 1,000 mL  Once     Route: Intravenous  Ordered Dose: 1,000 mL     Last MAR action: New Bag Kianna Billet    12/03/19 2331 12/03/19 2330  ondansetron (ZOFRAN) injection 8 mg  Once     Route: Intravenous  Ordered Dose: 8 mg     Last MAR action: Given Raffaella Edison            Procedures:      Procedures      Interpretations:                  RESULTS        Lab Results:      Results     Procedure Component Value Units Date/Time  CBC and differential [[id number]]  (Abnormal) Collected: 12/03/19 2310    Specimen: Blood Updated: 12/03/19 2355     WBC 13.46 x10 3/uL      Hgb 18.2 g/dL      Hematocrit 40.9 %      Platelets 275 x10 3/uL      RBC 6.18 x10 6/uL      MCV 90.6 fL      MCH 29.4 pg      MCHC 32.5 g/dL      RDW 14 %      MPV 11.6 fL      Neutrophils 80.4 %      Lymphocytes Automated 12.9 %      Monocytes 6.2 %      Eosinophils Automated 0.0 %      Basophils Automated 0.3 %      Immature Granulocytes 0.2 %      Nucleated RBC 0.0 /100 WBC      Neutrophils Absolute 10.82 x10 3/uL      Lymphocytes Absolute Automated 1.73 x10 3/uL      Monocytes Absolute Automated 0.84 x10 3/uL      Eosinophils Absolute Automated 0.00 x10 3/uL      Basophils Absolute Automated 0.04 x10 3/uL      Immature Granulocytes Absolute 0.03 x10 3/uL      Absolute NRBC 0.00 x10 3/uL     Narrative:      Replace urinary catheter prior to obtaining the urine culture  if it has been in place for greater than or equal to 14  days:->N/A  No Foley  Indications for U/A Reflex to Micro - Reflex to Culture:->N/A  Pediatric Patient    Lipase [[id number]] Collected: 12/03/19 2310    Specimen: Blood Updated: 12/03/19 2346     Lipase 24 U/L     Narrative:      Replace urinary catheter prior to obtaining the urine culture  if it has been in place for greater than or equal to 14  days:->N/A No Foley  Indications for U/A Reflex to Micro - Reflex to Culture:->N/A  Pediatric Patient    GFR [[id number]] Collected: 12/03/19 2310     Updated: 12/03/19 2346     EGFR 58.2    Narrative:      Replace urinary catheter prior to obtaining the urine culture  if it has been in place for greater than or equal to 14  days:->N/A No Foley  Indications for U/A Reflex to Micro - Reflex to Culture:->N/A  Pediatric Patient    Comprehensive metabolic panel [[id number]]  (Abnormal) Collected: 12/03/19 2310    Specimen: Blood Updated: 12/03/19 2346     Glucose 91 mg/dL      BUN 52.8 mg/dL      Creatinine 1.7 mg/dL      Sodium 413 mEq/L      Potassium 4.3 mEq/L      Chloride 103 mEq/L      CO2 24 mEq/L      Calcium 11.3 mg/dL      Protein, Total 9.6 g/dL      Albumin 5.7 g/dL      AST (SGOT) 46 U/L      ALT 46 U/L      Alkaline Phosphatase 72 U/L      Bilirubin, Total 1.1 mg/dL      Globulin 3.9 g/dL      Albumin/Globulin Ratio 1.5     Anion Gap 18.0    Narrative:  Replace urinary catheter prior to obtaining the urine culture  if it has been in place for greater than or equal to 14  days:->N/A No Foley  Indications for U/A Reflex to Micro - Reflex to Culture:->N/A  Pediatric Patient    Ethanol (Alcohol) Level [[id number]] Collected: 12/03/19 2310    Specimen: Blood Updated: 12/03/19 2343     Alcohol None Detected mg/dL               Radiology Results:      No orders to display     CT Abd/Pelvis without Contrast [[id number]] (Order [id number])  Status: Final result   Study Result    HISTORY: Epigastric pain     COMPARISON: CT abdomen/pelvis dated 07/08/2019    TECHNIQUE: CT abdomen and  pelvis without intravenous contrast.     FINDINGS:    Clear lung bases. Limited evaluation of the solid organs of the abdomen  without IV contrast. Subcentimeter hypodense lesions in the right  hepatic lobe too small for characterization. Unremarkable noncontrast  spleen, pancreas, gallbladder, adrenals, and kidneys.    Normal aortic caliber. No enlarged lymph nodes, free air, or free fluid.  Small clips in the right pelvis, possibly secondary to prior  appendectomy. No evidence of bowel obstruction. Normally distended  bladder. Nonenlarged prostate. Unremarkable osseous structures.    IMPRESSION:      The cause the patient's abdominal pain is not evident on this  noncontrast exam.    Carlynn Spry, MD   12/01/2019 6:43 PM               Scribe Attestation:      No scribe was involved in the care of this patient.          Delorse Lek, MD  12/04/19 [phone]

## 2019-12-03 NOTE — ED Triage Notes (Signed)
Pt. Presenting to ED c/o of abd pain and N/V rt to marijuana use. Pt. States he was recently discharged but came back rt to pain management issues.

## 2019-12-04 ENCOUNTER — Telehealth (INDEPENDENT_AMBULATORY_CARE_PROVIDER_SITE_OTHER): Payer: Self-pay

## 2019-12-04 LAB — ECG 12-LEAD
Atrial Rate: 102 {beats}/min
P-R Interval: 136 ms
Q-T Interval: 358 ms
QRS Duration: 118 ms
QTC Calculation (Bezet): 466 ms
R Axis: 93 degrees
T Axis: 62 degrees
Ventricular Rate: 102 {beats}/min

## 2019-12-04 MED ORDER — PROMETHAZINE HCL 12.5 MG PO TABS
12.5000 mg | ORAL_TABLET | Freq: Three times a day (TID) | ORAL | 0 refills | Status: DC | PRN
Start: 2019-12-04 — End: 2020-04-23

## 2019-12-04 NOTE — Telephone Encounter (Signed)
[facility] ([facility]    Received a referral to schedule a follow up appointment with the [facility].  Left patient a voicemail and provided main clinic phone number.  Requested patient return call to schedule a follow up appointment as soon as possible.       Ingrid Benitez  Transitional Services   Sched Reg Rep II  T [phone]  F [phone]

## 2019-12-04 NOTE — Discharge Instructions (Signed)
Dear Joel [name]:    Thank you for choosing the [facility] Emergency Department for your healthcare needs.     Specific instructions for your visit today:    You were evaluated in the ER for your nausea and vomiting.  Your kidney function is still low, but improved from a few days ago.  Please keep up with your fluid intake.  Return to the ER if, in the next 6-8 hours, you have any fevers, abdominal pain, back pain, headache, dizziness or if your vomiting returns.      Vomiting    You have been seen for vomiting.    Vomiting (throwing-up) can be caused by many different things. Most of the time the cause IS NOT serious. The doctor feels it is OK for you to go home today.    Common causes of vomiting include the following:   Gastroenteritis (stomach flu), usually with diarrhea.   Other illnesses. Sometimes medical conditions like diabetes, heart problems, headaches, or infections can make someone throw up.    Bowel obstructions (blockages) can cause vomiting and make patients unable to have bowel movements (stool) or pass gas.   Vomiting can be a symptom of appendicitis, especially if there is also pain in the right lower abdomen (belly).    Sometimes it is hard to find out what is causing the vomiting. Vomiting can be treated with anti-nausea medicines like promethazine (Phenergan), prochlorperazine (Compazine) or ondansetron (Zofran).    Try to drink liquids to avoid dehydration. Dont drink a lot of fluid all at once. Take small sips throughout the day.    YOU SHOULD SEEK MEDICAL ATTENTION IMMEDIATELY, EITHER HERE OR AT THE NEAREST EMERGENCY DEPARTMENT, IF ANY OF THE FOLLOWING OCCURS:   You cant stop vomiting or your vomiting doesnt get better with medication.   You cannot keep liquids down.   You have severe sudden chest or belly pain after vomiting.   You have abdominal pain.                        Delorse Lek, MD, MPH  Attending Physician  Department of Emergency  Medicine      BEFORE LEAVING THE EMERGENCY DEPARTMENT, PLEASE CHECK WITH REGISTRATION TO MAKE SURE WE HAVE AN UP-TO-DATE CONTACT TELEPHONE NUMBER FOR YOU.     Certain laboratory test results do not come back the same day.   Radiology films are often reviewed again to ensure accuracy.  We will contact you by telephone if any of these laboratory tests or radiology studies are abnormal.        IF YOU DO NOT CONTINUE TO IMPROVE OR IF YOUR CONDITION WORSENS, PLEASE CONTACT YOUR PRIMARY DOCTOR OR RETURN IMMEDIATELY TO THE EMERGENCY DEPARTMENT FOR A RE-EVALUATION.      IF YOU DO NOT HAVE A PRIMARY DOCTOR TO FOLLOW-UP WITH PLEASE CONTACT THE [facility] TO FIND ONE:    [facility]  Telephone:  [phone]  Website: [email or web address]

## 2019-12-14 ENCOUNTER — Emergency Department
Admission: EM | Admit: 2019-12-14 | Discharge: 2019-12-14 | Disposition: A | Discharge status: Home / Self Care | Payer: Medicaid Other | Attending: Emergency Medicine | Admitting: Emergency Medicine

## 2019-12-14 ENCOUNTER — Emergency Department: Payer: Medicaid Other

## 2019-12-14 DIAGNOSIS — E878 Other disorders of electrolyte and fluid balance, not elsewhere classified: Secondary | ICD-10-CM | POA: Insufficient documentation

## 2019-12-14 DIAGNOSIS — E872 Acidosis: Secondary | ICD-10-CM | POA: Insufficient documentation

## 2019-12-14 DIAGNOSIS — R000 Tachycardia, unspecified: Secondary | ICD-10-CM | POA: Insufficient documentation

## 2019-12-14 DIAGNOSIS — E8729 Other acidosis: Secondary | ICD-10-CM

## 2019-12-14 DIAGNOSIS — E871 Hypo-osmolality and hyponatremia: Secondary | ICD-10-CM | POA: Insufficient documentation

## 2019-12-14 DIAGNOSIS — R9431 Abnormal electrocardiogram [ECG] [EKG]: Secondary | ICD-10-CM

## 2019-12-14 DIAGNOSIS — N179 Acute kidney failure, unspecified: Secondary | ICD-10-CM | POA: Insufficient documentation

## 2019-12-14 DIAGNOSIS — I4510 Unspecified right bundle-branch block: Secondary | ICD-10-CM

## 2019-12-14 DIAGNOSIS — R451 Restlessness and agitation: Secondary | ICD-10-CM | POA: Insufficient documentation

## 2019-12-14 LAB — BASIC METABOLIC PANEL
Anion Gap: 11 (ref 5.0–15.0)
BUN: 33 mg/dL — ABNORMAL HIGH (ref 9.0–28.0)
CO2: 22 mEq/L (ref 22–29)
Calcium: 7.7 mg/dL — ABNORMAL LOW (ref 8.5–10.5)
Chloride: 97 mEq/L — ABNORMAL LOW (ref 100–111)
Creatinine: 1.4 mg/dL — ABNORMAL HIGH (ref 0.7–1.3)
Glucose: 122 mg/dL — ABNORMAL HIGH (ref 70–100)
Potassium: 3.6 mEq/L (ref 3.5–5.1)
Sodium: 130 mEq/L — ABNORMAL LOW (ref 136–145)

## 2019-12-14 LAB — GFR
EGFR: 54.5
EGFR: >60

## 2019-12-14 LAB — ECG 12-LEAD
Atrial Rate: 102 {beats}/min
P Axis: 85 degrees
P-R Interval: 122 ms
Q-T Interval: 398 ms
QRS Duration: 120 ms
QTC Calculation (Bezet): 518 ms
R Axis: 97 degrees
T Axis: 73 degrees
Ventricular Rate: 102 {beats}/min

## 2019-12-14 LAB — RAPID DRUG SCREEN, URINE
Barbiturate Screen, UR: NEGATIVE
Benzodiazepine Screen, UR: NEGATIVE
Cannabinoid Screen, UR: POSITIVE — AB
Cocaine, UR: NEGATIVE
Opiate Screen, UR: NEGATIVE
PCP Screen, UR: NEGATIVE
Urine Amphetamine Screen: NEGATIVE

## 2019-12-14 LAB — CBC AND DIFFERENTIAL
Absolute NRBC: 0 10*3/uL (ref 0.00–0.00)
Basophils Absolute Automated: 0.06 10*3/uL (ref 0.00–0.08)
Basophils Automated: 0.5 %
Eosinophils Absolute Automated: 0.01 10*3/uL (ref 0.00–0.44)
Eosinophils Automated: 0.1 %
Hematocrit: 56.5 % — ABNORMAL HIGH (ref 37.6–49.6)
Hgb: 20 g/dL — ABNORMAL HIGH (ref 12.5–17.1)
Immature Granulocytes Absolute: 0.04 10*3/uL (ref 0.00–0.07)
Immature Granulocytes: 0.3 %
Lymphocytes Absolute Automated: 3.17 10*3/uL (ref 0.42–3.22)
Lymphocytes Automated: 25 %
MCH: 30.3 pg (ref 25.1–33.5)
MCHC: 35.4 g/dL (ref 31.5–35.8)
MCV: 85.7 fL (ref 78.0–96.0)
MPV: 11.3 fL (ref 8.9–12.5)
Monocytes Absolute Automated: 0.81 10*3/uL (ref 0.21–0.85)
Monocytes: 6.4 %
Neutrophils Absolute: 8.58 10*3/uL — ABNORMAL HIGH (ref 1.10–6.33)
Neutrophils: 67.7 %
Nucleated RBC: 0 /100 WBC (ref 0.0–0.0)
Platelets: 397 10*3/uL — ABNORMAL HIGH (ref 142–346)
RBC: 6.59 10*6/uL — ABNORMAL HIGH (ref 4.20–5.90)
RDW: 13 % (ref 11–15)
WBC: 12.67 10*3/uL — ABNORMAL HIGH (ref 3.10–9.50)

## 2019-12-14 LAB — COMPREHENSIVE METABOLIC PANEL
ALT: 27 U/L (ref 0–55)
AST (SGOT): 32 U/L (ref 5–34)
Albumin/Globulin Ratio: 1.4 (ref 0.9–2.2)
Albumin: 5.7 g/dL — ABNORMAL HIGH (ref 3.5–5.0)
Alkaline Phosphatase: 65 U/L (ref 38–106)
Anion Gap: 19 — ABNORMAL HIGH (ref 5.0–15.0)
BUN: 38 mg/dL — ABNORMAL HIGH (ref 9.0–28.0)
Bilirubin, Total: 1.4 mg/dL — ABNORMAL HIGH (ref 0.2–1.2)
CO2: 26 mEq/L (ref 22–29)
Calcium: 10.6 mg/dL — ABNORMAL HIGH (ref 8.5–10.5)
Chloride: 82 mEq/L — ABNORMAL LOW (ref 100–111)
Creatinine: 1.8 mg/dL — ABNORMAL HIGH (ref 0.7–1.3)
Globulin: 4 g/dL — ABNORMAL HIGH (ref 2.0–3.6)
Glucose: 95 mg/dL (ref 70–100)
Potassium: 4.7 mEq/L (ref 3.5–5.1)
Protein, Total: 9.7 g/dL — ABNORMAL HIGH (ref 6.0–8.3)
Sodium: 127 mEq/L — ABNORMAL LOW (ref 136–145)

## 2019-12-14 LAB — URINALYSIS, REFLEX TO MICROSCOPIC EXAM IF INDICATED
Bilirubin, UA: NEGATIVE
Blood, UA: NEGATIVE
Glucose, UA: NEGATIVE
Ketones UA: 5 — AB
Leukocyte Esterase, UA: NEGATIVE
Nitrite, UA: NEGATIVE
Protein, UR: 100 — AB
Specific Gravity UA: 1.028 (ref 1.001–1.035)
Urine pH: 5 (ref 5.0–8.0)
Urobilinogen, UA: 2 mg/dL (ref 0.2–2.0)

## 2019-12-14 LAB — TROPONIN I: Troponin I: <0.01 ng/mL (ref 0.00–0.05)

## 2019-12-14 LAB — PT AND APTT
PT INR: 1.2 — ABNORMAL HIGH (ref 0.9–1.1)
PT: 14.1 s — ABNORMAL HIGH (ref 10.1–12.9)
PTT: 37 s (ref 27–39)

## 2019-12-14 LAB — CK: Creatine Kinase (CK): 341 U/L — ABNORMAL HIGH (ref 47–267)

## 2019-12-14 MED ORDER — LORAZEPAM 2 MG/ML IJ SOLN
2.00 mg | Freq: Once | INTRAMUSCULAR | Status: AC
Start: 2019-12-14 — End: 2019-12-14
  Administered 2019-12-14: 18:00:00 2 mg via INTRAMUSCULAR
  Filled 2019-12-14: qty 1

## 2019-12-14 MED ORDER — SODIUM CHLORIDE 0.9 % IV BOLUS
1000.00 mL | Freq: Once | INTRAVENOUS | Status: AC
Start: 2019-12-14 — End: 2019-12-14
  Administered 2019-12-14: 13:00:00 1000 mL via INTRAVENOUS

## 2019-12-14 MED ORDER — SODIUM CHLORIDE 0.9 % IV BOLUS
1000.00 mL | Freq: Once | INTRAVENOUS | Status: AC
Start: 2019-12-14 — End: 2019-12-14
  Administered 2019-12-14: 15:00:00 1000 mL via INTRAVENOUS
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
## 2019-12-14 NOTE — ED Notes (Signed)
Patient asking if he can order pizza to ED and keep it in his room. Nurse educated patient on need to keep NPO status until all results are back, including UA (not yet collected). Pt states, "I won't eat it, I just want to keep it here on the counter next to my bed and let the doctors have some too." This RN verbalized understanding, again re-educating patient on need to maintain NPO status but otherwise OK to order pizza.
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
## 2019-12-14 NOTE — Discharge Instructions (Signed)
Dear [name]:    Thank you for choosing the [facility] Emergency Department, the premier emergency department in the [place] area.  I hope your visit today was EXCELLENT. You will receive a survey via text message that will give you the opportunity to provide feedback to your team about your visit. Please do not hesitate to reach out with any questions!    Specific instructions for your visit today:      Acute Kidney Injury    You have been diagnosed with an acute kidney injury (AKI).    You have an acute kidney injury when something damages your kidneys. Most of the time, this damage is not permanent, and your kidney will get better on its own. In some cases, though, the damage is more serious. This kind of damage can be permanent and impossible to cure.    Many things can damage your kidneys. Some of these things are:   Infections.    Certain medicines.    Anything that lowers blood flow to your kidneys.    Anything that blocks the flow of urine (pee) from your kidneys to your bladder.    Most often, an acute kidney injury is caused by:   Dehydration.   A decrease in your hearts ability to pump blood to your kidneys.   Pain medicines like NSAIDs (non-steroidal anti-inflammatory medications), for example ibuprofen or naproxen.    Blood pressure medicines like ace-inhibitors, for example lisinopril or enalapril.    Water pills (diuretics) that are taken for high blood pressure.    Large kidney stones. These can keep your kidney's urine (pee) flow blocked for weeks at a time.   An enlarged prostate. This can keep urine from leaving your bladder.    To check for an acute kidney injury, your doctor will test your creatinine levels. This is a common lab test. This test is an indirect way of measuring how well your kidneys keep your blood clean. Your doctor might also take a urine (pee) sample or get an ultrasound of your kidneys. The doctor might also test how much creatinine, sodium, and  potassium there is in your urine (pee).     Most often, an acute kidney injury is not permanent. Your kidneys will go back to normal after your doctor has fixed the problem.    To help your symptoms, you should do the following at home:   Drink a lot of water. This will keep you hydrated.   Dont take NSAID pain medicines like ibuprofen or naproxen.   Follow up with your primary doctor or nephrologist (kidney doctor). If one of your medicines isnt working, you might be told to stop taking it.    Though we dont believe your condition is serious right now, it is important to be careful. Sometimes a problem that seems mild can become serious later. This is why it is very important that you return here or go to the nearest Emergency Department if you are not improving or your symptoms are getting worse.    YOU SHOULD SEEK MEDICAL ATTENTION IMMEDIATELY, EITHER HERE OR AT THE NEAREST EMERGENCY DEPARTMENT, IF ANY OF THE FOLLOWING OCCUR:     You feel very weak or short of breath.   You have nausea (feel sick to your stomach), or you are vomiting (throwing up).    You cant keep food or water down.   You have signs of infection. Some of these signs are:   Fever (temperature higher than  100.32F or 38C).   Heavy diarrhea (watery poop).    Back pain that you feel whenever you urinate (pee).    If you can't follow up with your doctor, or if at any time you feel you need to be rechecked or seen again, come back here or go to the nearest emergency department.                 IF YOU DO NOT CONTINUE TO IMPROVE OR YOUR CONDITION WORSENS, PLEASE CONTACT YOUR DOCTOR OR RETURN IMMEDIATELY TO THE EMERGENCY DEPARTMENT.    Sincerely,  Joel [name], Joel Spittle, MD  Attending Emergency Physician  [facility] Emergency Department    ONSITE PHARMACY  Our full service onsite pharmacy is located in the ER waiting room.  Open 7 days a week from 9 am to 9 pm.  We accept all major insurances and prices are competitive with  major retailers.  Ask your provider to print your prescriptions down to the pharmacy to speed you on your way home.    OBTAINING A PRIMARY CARE APPOINTMENT    Primary care physicians (PCPs, also known as primary care doctors) are either internists or family medicine doctors. Both types of PCPs focus on health promotion, disease prevention, patient education and counseling, and treatment of acute and chronic medical conditions.    If you need a primary care doctor, please call the below number and ask who is receiving new patients.      Medical Group  Telephone:  [phone]  [email or web address]    DOCTOR REFERRALS  Call [phone] (available 24 hours a day, 7 days a week) if you need any further referrals and we can help you find a primary care doctor or specialist.  Also, available online at:  [email or web address]    YOUR CONTACT INFORMATION  Before leaving please check with registration to make sure we have an up-to-date contact number.  You can call registration at [phone] to update your information.  For questions about your hospital bill, please call [phone].  For questions about your Emergency Dept Physician bill please call [phone].      FREE HEALTH SERVICES  If you need help with health or social services, please call 2-1-1 for a free referral to resources in your area.  2-1-1 is a free service connecting people with information on health insurance, free clinics, pregnancy, mental health, dental care, food assistance, housing, and substance abuse counseling.  Also, available online at:  http://www.[facility].org    MEDICAL RECORDS AND TESTS  Certain laboratory test results do not come back the same day, for example urine cultures.   We will contact you if other important findings are noted.  Radiology films are often reviewed again to ensure accuracy.  If there is any discrepancy, we will notify you.      Please call [phone] to pick up a complimentary CD of  any radiology studies performed.  If you or your doctor would like to request a copy of your medical records, please call [phone].      ORTHOPEDIC INJURY   Please know that significant injuries can exist even when an initial x-ray is read as normal or negative.  This can occur because some fractures (broken bones) are not initially visible on x-rays.  For this reason, close outpatient follow-up with your primary care doctor or bone specialist (orthopedist) is required.    MEDICATIONS AND FOLLOWUP  Please be aware that some prescription medications can cause drowsiness.  Use caution when driving  or operating machinery.    The examination and treatment you have received in our Emergency Department is provided on an emergency basis, and is not intended to be a substitute for your primary care physician.  It is important that your doctor checks you again and that you report any new or remaining problems at that time.      24 HOUR PHARMACIES  The nearest 24 hour pharmacy is:    [facility]  [address]  [place], [place] [address]  [phone]      ASSISTANCE WITH INSURANCE    Affordable Care Act  [facility])  Call to start or finish an application, compare plans, enroll or ask a question.  [phone]  TTY: [phone]  Web:  Healthcare.gov    Help Enrolling in [facility]  [employer] [place]  [phone] (TOLL-FREE)  [phone] (TTY)  Web:  Http://www.coverva.org    Local Help Enrolling in the [facility]  [place] [facility]  [phone] (MAIN)  Email:  health-help@nvfs .org  Web:  [employer]jackMyths.is  Address:  [address], [address] [place], [place] [address]    SEDATING MEDICATIONS  Sedating medications include strong pain medications (e.g. narcotics), muscle relaxers, benzodiazepines (used for anxiety and as muscle relaxers), Benadryl/diphenhydramine and other antihistamines for allergic reactions/itching, and other medications.  If you are unsure if you have received a sedating  medication, please ask your physician or nurse.  If you received a sedating medication: DO NOT drive a car. DO NOT operate machinery. DO NOT perform jobs where you need to be alert.  DO NOT drink alcoholic beverages while taking this medicine.     If you get dizzy, sit or lie down at the first signs. Be careful going up and down stairs.  Be extra careful to prevent falls.     Never give this medicine to others.     Keep this medicine out of reach of children.     Do not take or save old medicines. Throw them away when outdated.     Keep all medicines in a cool, dry place. DO NOT keep them in your bathroom medicine cabinet or in a cabinet above the stove.    MEDICATION REFILLS  Please be aware that we cannot refill any prescriptions through the ER. If you need further treatment from what is provided at your ER visit, please follow up with your primary care doctor or your pain management specialist.    FREESTANDING EMERGENCY DEPARTMENTS OF [facility]  Did you know [facility] has two freestanding ERs located just a few miles away?  [facility] [place] and [facility] [place] have short wait times, easy free parking directly in front of the building and top patient satisfaction scores - and the same Board Certified Emergency Medicine doctors as [facility]

## 2019-12-14 NOTE — ED Notes (Signed)
Difficulty obtaining IV access - Verena Shawgo, RN as well as EMT attempted without success. Trey Paula, EMT made aware and will attempt IV access shortly

## 2019-12-14 NOTE — ED Notes (Addendum)
Pt's mother returned to ED. MD Chopard and PA Lauren at bedside to discuss plan of care and resulted blood work. Plan to be [place]'d and follow up with neurology. Security at bedside. [place] instructions reviewed with pt's mother. Safety plan made with pt and mother. Pt agreed not to harm self or others. Bilateral wrist restraints removed. Pt collected all personal belongings, [place] instructions, and ambulated out of the ED with a steady gait.
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
## 2019-12-14 NOTE — ED Notes (Signed)
Pt asked to provide urine sample after appx 750 mL infused of the NS bolus. Pt states he cannot pee at this time and needs another bag of fluids. MD aware.
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
## 2020-04-19 ENCOUNTER — Observation Stay: Payer: Medicaid Managed Care Other

## 2020-04-19 ENCOUNTER — Inpatient Hospital Stay
Admission: EM | Admit: 2020-04-19 | Discharge: 2020-04-23 | DRG: 469 | Disposition: A | Discharge status: Home / Self Care | Payer: Medicaid Managed Care Other | Attending: Hospitalist | Admitting: Hospitalist

## 2020-04-19 DIAGNOSIS — E872 Acidosis: Secondary | ICD-10-CM | POA: Diagnosis present

## 2020-04-19 DIAGNOSIS — R112 Nausea with vomiting, unspecified: Secondary | ICD-10-CM

## 2020-04-19 DIAGNOSIS — Z915 Personal history of self-harm: Secondary | ICD-10-CM

## 2020-04-19 DIAGNOSIS — E860 Dehydration: Secondary | ICD-10-CM | POA: Diagnosis present

## 2020-04-19 DIAGNOSIS — Z9049 Acquired absence of other specified parts of digestive tract: Secondary | ICD-10-CM

## 2020-04-19 DIAGNOSIS — Z9119 Patient's noncompliance with other medical treatment and regimen: Secondary | ICD-10-CM

## 2020-04-19 DIAGNOSIS — F1011 Alcohol abuse, in remission: Secondary | ICD-10-CM | POA: Diagnosis present

## 2020-04-19 DIAGNOSIS — E871 Hypo-osmolality and hyponatremia: Secondary | ICD-10-CM | POA: Diagnosis present

## 2020-04-19 DIAGNOSIS — Z634 Disappearance and death of family member: Secondary | ICD-10-CM

## 2020-04-19 DIAGNOSIS — Z5320 Procedure and treatment not carried out because of patient's decision for unspecified reasons: Secondary | ICD-10-CM | POA: Diagnosis not present

## 2020-04-19 DIAGNOSIS — M6282 Rhabdomyolysis: Secondary | ICD-10-CM | POA: Diagnosis present

## 2020-04-19 DIAGNOSIS — Z87820 Personal history of traumatic brain injury: Secondary | ICD-10-CM

## 2020-04-19 DIAGNOSIS — E875 Hyperkalemia: Secondary | ICD-10-CM | POA: Diagnosis present

## 2020-04-19 DIAGNOSIS — F319 Bipolar disorder, unspecified: Secondary | ICD-10-CM | POA: Diagnosis present

## 2020-04-19 DIAGNOSIS — R1110 Vomiting, unspecified: Secondary | ICD-10-CM

## 2020-04-19 DIAGNOSIS — N179 Acute kidney failure, unspecified: Principal | ICD-10-CM | POA: Diagnosis present

## 2020-04-19 DIAGNOSIS — F1290 Cannabis use, unspecified, uncomplicated: Secondary | ICD-10-CM

## 2020-04-19 DIAGNOSIS — F419 Anxiety disorder, unspecified: Secondary | ICD-10-CM

## 2020-04-19 DIAGNOSIS — D751 Secondary polycythemia: Secondary | ICD-10-CM | POA: Diagnosis present

## 2020-04-19 DIAGNOSIS — F17200 Nicotine dependence, unspecified, uncomplicated: Secondary | ICD-10-CM | POA: Diagnosis present

## 2020-04-19 DIAGNOSIS — E870 Hyperosmolality and hypernatremia: Secondary | ICD-10-CM | POA: Diagnosis present

## 2020-04-19 DIAGNOSIS — F12259 Cannabis dependence with psychotic disorder, unspecified: Secondary | ICD-10-CM | POA: Diagnosis present

## 2020-04-19 HISTORY — DX: Nausea with vomiting, unspecified: R11.2

## 2020-04-19 HISTORY — DX: Cannabis use, unspecified, uncomplicated: F12.90

## 2020-04-19 LAB — COMPREHENSIVE METABOLIC PANEL
ALT: 26 U/L (ref 0–55)
AST (SGOT): 46 U/L — ABNORMAL HIGH (ref 5–34)
Albumin/Globulin Ratio: 1.5 (ref 0.9–2.2)
Albumin: 6.5 g/dL — ABNORMAL HIGH (ref 3.5–5.0)
Alkaline Phosphatase: 86 U/L (ref 38–106)
Anion Gap: 23 — ABNORMAL HIGH (ref 5.0–15.0)
BUN: 43 mg/dL — ABNORMAL HIGH (ref 9.0–28.0)
Bilirubin, Total: 0.5 mg/dL (ref 0.2–1.2)
CO2: 21 mEq/L — ABNORMAL LOW (ref 22–29)
Calcium: 12.3 mg/dL — ABNORMAL HIGH (ref 8.5–10.5)
Chloride: 105 mEq/L (ref 100–111)
Creatinine: 6.5 mg/dL — ABNORMAL HIGH (ref 0.7–1.3)
Globulin: 4.2 g/dL — ABNORMAL HIGH (ref 2.0–3.6)
Glucose: 115 mg/dL — ABNORMAL HIGH (ref 70–100)
Potassium: 5.2 mEq/L — ABNORMAL HIGH (ref 3.5–5.1)
Protein, Total: 10.7 g/dL — ABNORMAL HIGH (ref 6.0–8.3)
Sodium: 149 mEq/L — ABNORMAL HIGH (ref 136–145)

## 2020-04-19 LAB — CBC AND DIFFERENTIAL
Absolute NRBC: 0 10*3/uL (ref 0.00–0.00)
Basophils Absolute Automated: 0.07 10*3/uL (ref 0.00–0.08)
Basophils Automated: 0.3 %
Eosinophils Absolute Automated: 0 10*3/uL (ref 0.00–0.44)
Eosinophils Automated: 0 %
Hematocrit: 54.3 % — ABNORMAL HIGH (ref 37.6–49.6)
Hgb: 18.6 g/dL — ABNORMAL HIGH (ref 12.5–17.1)
Immature Granulocytes Absolute: 0.23 10*3/uL — ABNORMAL HIGH (ref 0.00–0.07)
Immature Granulocytes: 0.8 %
Lymphocytes Absolute Automated: 1.26 10*3/uL (ref 0.42–3.22)
Lymphocytes Automated: 4.6 %
MCH: 31.4 pg (ref 25.1–33.5)
MCHC: 34.3 g/dL (ref 31.5–35.8)
MCV: 91.7 fL (ref 78.0–96.0)
MPV: 11.6 fL (ref 8.9–12.5)
Monocytes Absolute Automated: 1.47 10*3/uL — ABNORMAL HIGH (ref 0.21–0.85)
Monocytes: 5.4 %
Neutrophils Absolute: 24.31 10*3/uL — ABNORMAL HIGH (ref 1.10–6.33)
Neutrophils: 88.9 %
Nucleated RBC: 0 /100 WBC (ref 0.0–0.0)
Platelets: 332 10*3/uL (ref 142–346)
RBC: 5.92 10*6/uL — ABNORMAL HIGH (ref 4.20–5.90)
RDW: 15 % (ref 11–15)
WBC: 27.34 10*3/uL — ABNORMAL HIGH (ref 3.10–9.50)

## 2020-04-19 LAB — GFR: EGFR: 12.3

## 2020-04-19 LAB — LACTIC ACID, PLASMA: Lactic Acid: 2 mmol/L (ref 0.2–2.0)

## 2020-04-19 MED ORDER — HEPARIN SODIUM (PORCINE) 5000 UNIT/ML IJ SOLN
5000.0000 [IU] | Freq: Two times a day (BID) | INTRAMUSCULAR | Status: DC
Start: 2020-04-19 — End: 2020-04-23
  Administered 2020-04-19 – 2020-04-23 (×8): 5000 [IU] via SUBCUTANEOUS
  Filled 2020-04-19 (×8): qty 1

## 2020-04-19 MED ORDER — GABAPENTIN 100 MG PO CAPS
100.0000 mg | ORAL_CAPSULE | Freq: Three times a day (TID) | ORAL | Status: DC
Start: 2020-04-19 — End: 2020-04-23
  Administered 2020-04-19 – 2020-04-23 (×10): 100 mg via ORAL
  Filled 2020-04-19 (×10): qty 1

## 2020-04-19 MED ORDER — MELATONIN 3 MG PO TABS
6.0000 mg | ORAL_TABLET | Freq: Every evening | ORAL | Status: DC | PRN
Start: 2020-04-19 — End: 2020-04-22
  Administered 2020-04-20 – 2020-04-22 (×2): 6 mg via ORAL
  Filled 2020-04-19 (×2): qty 2

## 2020-04-19 MED ORDER — SODIUM CHLORIDE 0.9 % IV BOLUS
1000.00 mL | Freq: Once | INTRAVENOUS | Status: AC
Start: 2020-04-19 — End: 2020-04-19
  Administered 2020-04-19: 23:00:00 1000 mL via INTRAVENOUS

## 2020-04-19 MED ORDER — SODIUM CHLORIDE 0.9 % IV SOLN
INTRAVENOUS | Status: DC
Start: 2020-04-19 — End: 2020-04-20

## 2020-04-19 MED ORDER — ONDANSETRON 4 MG PO TBDP
4.0000 mg | ORAL_TABLET | Freq: Four times a day (QID) | ORAL | Status: DC | PRN
Start: 2020-04-19 — End: 2020-04-21

## 2020-04-19 MED ORDER — ACETAMINOPHEN 325 MG PO TABS
650.0000 mg | ORAL_TABLET | Freq: Four times a day (QID) | ORAL | Status: DC | PRN
Start: 2020-04-19 — End: 2020-04-23

## 2020-04-19 MED ORDER — ONDANSETRON HCL 4 MG/2ML IJ SOLN
4.00 mg | Freq: Once | INTRAMUSCULAR | Status: AC
Start: 2020-04-19 — End: 2020-04-19
  Administered 2020-04-19: 19:00:00 4 mg via INTRAVENOUS
  Filled 2020-04-19: qty 2

## 2020-04-19 MED ORDER — TRAZODONE HCL 50 MG PO TABS
50.0000 mg | ORAL_TABLET | Freq: Every evening | ORAL | Status: DC
Start: 2020-04-19 — End: 2020-04-20
  Administered 2020-04-19: 23:00:00 50 mg via ORAL
  Filled 2020-04-19: qty 1

## 2020-04-19 MED ORDER — GLUCAGON 1 MG IJ SOLR (WRAP)
1.0000 mg | INTRAMUSCULAR | Status: DC | PRN
Start: 2020-04-19 — End: 2020-04-23

## 2020-04-19 MED ORDER — SERTRALINE HCL 25 MG PO TABS
25.0000 mg | ORAL_TABLET | Freq: Every day | ORAL | Status: DC
Start: 2020-04-20 — End: 2020-04-20
  Administered 2020-04-20: 11:00:00 25 mg via ORAL
  Filled 2020-04-19 (×2): qty 1

## 2020-04-19 MED ORDER — SODIUM CHLORIDE 0.9 % IV BOLUS
1000.00 mL | Freq: Once | INTRAVENOUS | Status: AC
Start: 2020-04-19 — End: 2020-04-20
  Administered 2020-04-20: 03:00:00 1000 mL via INTRAVENOUS

## 2020-04-19 MED ORDER — DEXTROSE 50 % IV SOLN
12.5000 g | INTRAVENOUS | Status: DC | PRN
Start: 2020-04-19 — End: 2020-04-23

## 2020-04-19 MED ORDER — NALOXONE HCL 0.4 MG/ML IJ SOLN (WRAP)
0.2000 mg | INTRAMUSCULAR | Status: DC | PRN
Start: 2020-04-19 — End: 2020-04-23

## 2020-04-19 MED ORDER — ACETAMINOPHEN 650 MG RE SUPP
650.0000 mg | Freq: Four times a day (QID) | RECTAL | Status: DC | PRN
Start: 2020-04-19 — End: 2020-04-23

## 2020-04-19 MED ORDER — GLUCOSE 40 % PO GEL
15.0000 g | ORAL | Status: DC | PRN
Start: 2020-04-19 — End: 2020-04-23

## 2020-04-19 MED ORDER — ONDANSETRON HCL 4 MG/2ML IJ SOLN
4.0000 mg | Freq: Four times a day (QID) | INTRAMUSCULAR | Status: DC | PRN
Start: 2020-04-19 — End: 2020-04-21
  Administered 2020-04-20 (×2): 4 mg via INTRAVENOUS
  Filled 2020-04-19 (×2): qty 2

## 2020-04-19 MED ORDER — SODIUM CHLORIDE 0.9 % IV BOLUS
1000.00 mL | Freq: Once | INTRAVENOUS | Status: AC
Start: 2020-04-19 — End: 2020-04-19
  Administered 2020-04-19: 19:00:00 1000 mL via INTRAVENOUS

## 2020-04-19 MED ORDER — LORAZEPAM 2 MG/ML IJ SOLN
1.00 mg | Freq: Once | INTRAMUSCULAR | Status: AC
Start: 2020-04-19 — End: 2020-04-19
  Administered 2020-04-19: 23:00:00 1 mg via INTRAVENOUS
  Filled 2020-04-19: qty 1

## 2020-04-19 NOTE — Discharge Instr - AVS First Page (Addendum)
Reason for your Hospital Admission:  -Intractable Nausea  and vomiting   -Abnormal kidney function and electrolytes (noted on lab results). Kidney function and electrolytes improved back to normal levels after you received IV fluids, supplements, and anti-nausea treatment     Instructions for after your discharge:  -- Schedule a follow up visit with your primary care provider - within 1 week of discharge from the hospital. If you do not have a PCP, you can follow-up with the [facility] clinic for reevaluation. They will see you regardless of insurance. Information listed below.   -- Please have labs rechecked in 1 week with your primary care team to monitor your electrolytes, kidney function.    -- NEW: Capsaicin cream to abdomen three times daily.  -- NEW: Reglan 5 mg three times daily before meals.   -- NEW: Scopolamine patch for nausea, use as directed.   -- NEW: Alternate Zofran, Phenergan as needed for nausea. See directions below and on prescription.   -- Resume your home medications as directed, no changes were made (see list below).   -- Please bring your medication in bottles or a list of medications, with name, dosage, and frequency to all your follow up appointments  -- Hydrate well, activity as tolerated. Advance diet as tolerated to bland, more information below.   -- Please stop using drugs and/or alcohol, we are concerned about your health. Contact information attached for detox programs through either the [facility], [employer], or the [facility] [employer]. Contact information for [facility] hospital behavioral health programs is also listed in case location is more convenient.   -- Return to the nearest emergency department for chest pain, shortness of breath, fever >100.5 degrees, dizziness, passing out, profuse vomiting or diarrhea, or any other concerning symptoms.
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
## 2020-04-19 NOTE — ED Notes (Addendum)
Pt up walking with steady gate to restroom at this time.
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
## 2020-04-19 NOTE — Progress Notes (Signed)
NURSING ADMISSION NOTE    Date Time: 04/19/20 11:11 PM  Patient Name: [name]  Attending Physician: Roxana Hires, MD    Date of Admission:   04/19/2020    Reason for Admission:   Dehydration [E86.0]  AKI (acute kidney injury) [N17.9]  Vomiting, intractability of vomiting not specified, presence of nausea not specified, unspecified vomiting type [R11.10]    Admitted from:   ED    Nursing Note:   Patient AOx4, English speaking, and VSS. On tele, NSR, confirmed with [facility], and alerted Charge Nurse Bekah. Oriented patient to the floor and POC. Bed to low position. Call bell within reach.     Patient scores as low/mod/high: Low Falls risk. Patient ambulates: independently with: no assistance.  Bed alarm on and floor mats in place. Patient skin intact with scattered tattoos, wounds present No, 4 eyes in 4 hours with RN Renee. Patient reports abd pain after emesis. Relieved with hot shower.        Active Lines and Wounds:      Patient Lines/Drains/Airways Status    Active Lines, Drains and Airways     Name:   Placement date:   Placement time:   Site:   Days:    Peripheral IV 04/19/20 20 G Anterior;Distal;Right Upper Arm   04/19/20    1805    Upper Arm   less than 1                   Vitals:    04/19/20 1456 04/19/20 1515 04/19/20 2105 04/19/20 2213   BP:  102/69 (!) 137/97 (!) 129/95   Pulse: 81 78 99 94   Resp:  18  18   Temp:  98.5 F (36.9 C)  97.7 F (36.5 C)   TempSrc:  Temporal  Oral   SpO2: 92% 94% 94% 99%   Weight:  56.7 kg (125 lb)     Height:  1.702 m (5\' 7" )

## 2020-04-19 NOTE — ED Notes (Signed)
[facility] HOSPITAL EMERGENCY DEPT  ED NURSING NOTE FOR THE RECEIVING INPATIENT NURSE   ED NURSE Shay/Rachel   [facility] [id number]   ED CHARGE RN Tawanna Cooler [phone]   ADMISSION INFORMATION   Joel [name]. is a 29 y.o. male admitted with an ED diagnosis of:    1. Dehydration    2. Vomiting, intractability of vomiting not specified, presence of nausea not specified, unspecified vomiting type    3. AKI (acute kidney injury)         Isolation: None   Allergies: Pollen extract   Holding Orders confirmed? N/A   Belongings Documented? No   Home medications sent to pharmacy confirmed? N/A   NURSING CARE   Patient Comes From:   Mental Status: Home Independent  alert and oriented   ADL: Independent with all ADLs   Ambulation: no difficulty   Pertinent Information  and Safety Concerns: PMHx of EtOH dependence, bipolar disorder, substance abuse who presents with dehydration and 1 episode of vomiting yesterday. Pt went to [facility] yesterday, was given fluids and dxed with cannabis induced hyperemesis. Pt requesting more fluids and c/o feeling dizzy. Pt notes that he has no kidney problems, but has not seen a doctor for it     CT / NIH   CT Head ordered on this patient?  N/A   NIH/Dysphagia assessment done prior to admission? N/A   VITAL SIGNS (at the time of this note)      Vitals:    04/19/20 1515   BP: 102/69   Pulse: 78   Resp: 18   Temp: 98.5 F (36.9 C)   SpO2: 94%
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
## 2020-04-19 NOTE — ED Triage Notes (Addendum)
Per pt's wife, pt vomited around 1730, pt has been dizzy , SOB, CP, +chills. Pt appears diaphoretic. Pt had negative Covid 19 swab 2 weeks ago. Received 1 dose of Moderna Covid 19 Vaccine.

## 2020-04-20 DIAGNOSIS — M6282 Rhabdomyolysis: Secondary | ICD-10-CM

## 2020-04-20 DIAGNOSIS — E875 Hyperkalemia: Secondary | ICD-10-CM

## 2020-04-20 DIAGNOSIS — R112 Nausea with vomiting, unspecified: Secondary | ICD-10-CM

## 2020-04-20 DIAGNOSIS — F1210 Cannabis abuse, uncomplicated: Secondary | ICD-10-CM

## 2020-04-20 DIAGNOSIS — F1290 Cannabis use, unspecified, uncomplicated: Secondary | ICD-10-CM

## 2020-04-20 DIAGNOSIS — F19959 Other psychoactive substance use, unspecified with psychoactive substance-induced psychotic disorder, unspecified: Secondary | ICD-10-CM

## 2020-04-20 DIAGNOSIS — F1994 Other psychoactive substance use, unspecified with psychoactive substance-induced mood disorder: Secondary | ICD-10-CM

## 2020-04-20 DIAGNOSIS — N179 Acute kidney failure, unspecified: Secondary | ICD-10-CM | POA: Diagnosis present

## 2020-04-20 LAB — BASIC METABOLIC PANEL
Anion Gap: 18 — ABNORMAL HIGH (ref 5.0–15.0)
Anion Gap: 12 (ref 5.0–15.0)
BUN: 49 mg/dL — ABNORMAL HIGH (ref 9.0–28.0)
BUN: 42 mg/dL — ABNORMAL HIGH (ref 9.0–28.0)
CO2: 18 mEq/L — ABNORMAL LOW (ref 22–29)
CO2: 22 mEq/L (ref 22–29)
Calcium: 10.4 mg/dL (ref 8.5–10.5)
Calcium: 10.1 mg/dL (ref 8.5–10.5)
Chloride: 113 mEq/L — ABNORMAL HIGH (ref 100–111)
Chloride: 112 mEq/L — ABNORMAL HIGH (ref 100–111)
Creatinine: 5.1 mg/dL — ABNORMAL HIGH (ref 0.7–1.3)
Creatinine: 2.8 mg/dL — ABNORMAL HIGH (ref 0.7–1.3)
Glucose: 99 mg/dL (ref 70–100)
Glucose: 106 mg/dL — ABNORMAL HIGH (ref 70–100)
Potassium: 5.7 mEq/L — ABNORMAL HIGH (ref 3.5–5.1)
Potassium: 4.3 mEq/L (ref 3.5–5.1)
Sodium: 149 mEq/L — ABNORMAL HIGH (ref 136–145)
Sodium: 146 mEq/L — ABNORMAL HIGH (ref 136–145)

## 2020-04-20 LAB — GFR
EGFR: 16.3
EGFR: 32.6

## 2020-04-20 LAB — CBC AND DIFFERENTIAL
Absolute NRBC: 0 10*3/uL (ref 0.00–0.00)
Basophils Absolute Automated: 0.05 10*3/uL (ref 0.00–0.08)
Basophils Automated: 0.2 %
Eosinophils Absolute Automated: 0 10*3/uL (ref 0.00–0.44)
Eosinophils Automated: 0 %
Hematocrit: 49.2 % (ref 37.6–49.6)
Hgb: 16.1 g/dL (ref 12.5–17.1)
Immature Granulocytes Absolute: 0.17 10*3/uL — ABNORMAL HIGH (ref 0.00–0.07)
Immature Granulocytes: 0.8 %
Lymphocytes Absolute Automated: 1.49 10*3/uL (ref 0.42–3.22)
Lymphocytes Automated: 7 %
MCH: 30.2 pg (ref 25.1–33.5)
MCHC: 32.7 g/dL (ref 31.5–35.8)
MCV: 92.3 fL (ref 78.0–96.0)
MPV: 11.9 fL (ref 8.9–12.5)
Monocytes Absolute Automated: 1.32 10*3/uL — ABNORMAL HIGH (ref 0.21–0.85)
Monocytes: 6.2 %
Neutrophils Absolute: 18.38 10*3/uL — ABNORMAL HIGH (ref 1.10–6.33)
Neutrophils: 85.8 %
Nucleated RBC: 0 /100 WBC (ref 0.0–0.0)
Platelets: 280 10*3/uL (ref 142–346)
RBC: 5.33 10*6/uL (ref 4.20–5.90)
RDW: 15 % (ref 11–15)
WBC: 21.41 10*3/uL — ABNORMAL HIGH (ref 3.10–9.50)

## 2020-04-20 LAB — RAPID DRUG SCREEN, URINE
Barbiturate Screen, UR: NEGATIVE
Benzodiazepine Screen, UR: NEGATIVE
Cannabinoid Screen, UR: POSITIVE — AB
Cocaine, UR: NEGATIVE
Opiate Screen, UR: NEGATIVE
PCP Screen, UR: NEGATIVE
Urine Amphetamine Screen: NEGATIVE

## 2020-04-20 LAB — URINALYSIS REFLEX TO MICROSCOPIC EXAM - REFLEX TO CULTURE
Bilirubin, UA: NEGATIVE
Glucose, UA: NEGATIVE
Ketones UA: NEGATIVE
Leukocyte Esterase, UA: NEGATIVE
Nitrite, UA: NEGATIVE
Protein, UR: 100 — AB
Specific Gravity UA: 1.021 (ref 1.001–1.035)
Urine pH: 5 (ref 5.0–8.0)
Urobilinogen, UA: NORMAL mg/dL (ref 0.2–2.0)

## 2020-04-20 LAB — CK: Creatine Kinase (CK): 2182 U/L — ABNORMAL HIGH (ref 47–267)

## 2020-04-20 LAB — PROTEIN / CREATININE RATIO, URINE
Urine Creatinine, Random: 232.8 mg/dL
Urine Protein Random: 31.9 mg/dL — ABNORMAL HIGH (ref 1.0–14.0)
Urine Protein/Creatinine Ratio: 0.1

## 2020-04-20 LAB — TSH: TSH: 0.57 u[IU]/mL (ref 0.35–4.94)

## 2020-04-20 LAB — MAGNESIUM: Magnesium: 2.9 mg/dL — ABNORMAL HIGH (ref 1.6–2.6)

## 2020-04-20 MED ORDER — LORAZEPAM 2 MG/ML IJ SOLN
1.0000 mg | Freq: Once | INTRAMUSCULAR | Status: AC
Start: 2020-04-20 — End: 2020-04-20
  Administered 2020-04-20: 08:00:00 1 mg via INTRAVENOUS
  Filled 2020-04-20: qty 1

## 2020-04-20 MED ORDER — SODIUM CHLORIDE 0.45 % IV SOLN
INTRAVENOUS | Status: AC
Start: 2020-04-20 — End: 2020-04-21

## 2020-04-20 MED ORDER — OLANZAPINE 5 MG PO TBDP
5.0000 mg | ORAL_TABLET | Freq: Two times a day (BID) | ORAL | Status: DC
Start: 2020-04-20 — End: 2020-04-21
  Administered 2020-04-20 – 2020-04-21 (×3): 5 mg via ORAL
  Filled 2020-04-20 (×4): qty 1

## 2020-04-20 MED ORDER — BISACODYL 10 MG RE SUPP
10.0000 mg | Freq: Every day | RECTAL | Status: DC | PRN
Start: 2020-04-20 — End: 2020-04-23

## 2020-04-20 MED ORDER — SODIUM POLYSTYRENE SULFONATE 15 GM/60ML PO SUSP
15.0000 g | Freq: Once | ORAL | Status: AC
Start: 2020-04-20 — End: 2020-04-20
  Administered 2020-04-20: 06:00:00 15 g via ORAL
  Filled 2020-04-20: qty 60

## 2020-04-20 MED ORDER — CAPSAICIN 0.025 % EX CREA
TOPICAL_CREAM | Freq: Three times a day (TID) | CUTANEOUS | Status: AC
Start: 2020-04-20 — End: 2020-04-21
  Filled 2020-04-20: qty 60

## 2020-04-20 MED ORDER — POLYETHYLENE GLYCOL 3350 17 G PO PACK
17.0000 g | PACK | Freq: Every day | ORAL | Status: DC
Start: 2020-04-20 — End: 2020-04-23
  Administered 2020-04-20 – 2020-04-23 (×4): 17 g via ORAL
  Filled 2020-04-20 (×4): qty 1

## 2020-04-20 MED ORDER — LORAZEPAM 2 MG/ML IJ SOLN
0.5000 mg | Freq: Four times a day (QID) | INTRAMUSCULAR | Status: DC | PRN
Start: 2020-04-20 — End: 2020-04-20
  Administered 2020-04-20: 15:00:00 0.5 mg via INTRAVENOUS
  Filled 2020-04-20: qty 1

## 2020-04-20 MED ORDER — OLANZAPINE 10 MG IM SOLR
5.0000 mg | Freq: Once | INTRAMUSCULAR | Status: DC | PRN
Start: 2020-04-20 — End: 2020-04-21
  Filled 2020-04-20: qty 5

## 2020-04-20 MED ORDER — SODIUM POLYSTYRENE SULFONATE 15 GM/60ML PO SUSP
15.0000 g | Freq: Once | ORAL | Status: AC
Start: 2020-04-20 — End: 2020-04-20
  Administered 2020-04-20: 04:00:00 15 g via ORAL
  Filled 2020-04-20: qty 60

## 2020-04-20 MED ORDER — PLASMA-LYTE A IV INFUSION
INTRAVENOUS | Status: DC
Start: 2020-04-20 — End: 2020-04-20

## 2020-04-20 NOTE — Plan of Care (Addendum)
Adult Observation Progress Note    Shift note:  IV in place, running NS @125 . Fall level low. Low suspicion of elopement.      General: Patient VSS, in no apparent distress at this time.  Neuro: Patient is A&Ox4. Calm and cooperative.     Cardio: Tele in place with NSR.  Resp: Patient on RA with lung sounds clear bilaterally on auscultation.   Integ: Intact with scattered tattoos.   MSK: Full movement in all extremities.  GI: Bowel sounds auscultated all 4 quadrants. Nausea and frequent vomiting. Relieved with hot showers.   GU: Patient is continent of urine.      BM during shift:  Yes/No: No     Pending Orders:   IVF  UDS  Monitor BMP    Discharge Plan:  tbd    Social/Family Visits:  n/a    POC update: Patient updated on plan of care, verbalized understanding.       Vitals:    04/19/20 1456 04/19/20 1515 04/19/20 2105 04/19/20 2213   BP:  102/69 (!) 137/97 (!) 129/95   Pulse: 81 78 99 94   Resp:  18  18   Temp:  98.5 F (36.9 C)  97.7 F (36.5 C)   TempSrc:  Temporal  Oral   SpO2: 92% 94% 94% 99%   Weight:  56.7 kg (125 lb)  56.7 kg (125 lb)   Height:  1.702 m (5\' 7" )  1.702 m (5\' 7" )       Patient Lines/Drains/Airways Status      Active Lines, Drains and Airways       Name:   Placement date:   Placement time:   Site:   Days:    Peripheral IV 04/19/20 20 G Anterior;Distal;Right Upper Arm   04/19/20    1805    Upper Arm   less than 1           Inactive Lines, Drains and Airways       None                   Problem: Safety  Goal: Patient will be free from injury during hospitalization  Outcome: Progressing  Flowsheets (Taken 04/20/2020 0054)  Patient will be free from injury during hospitalization:   Assess patient's risk for falls and implement fall prevention plan of care per policy   Provide and maintain safe environment   Include patient/ family/ care giver in decisions related to safety   Assess for patients risk for elopement and implement Elopement Risk Plan per policy  Goal: Patient will be free from  infection during hospitalization  Outcome: Progressing  Flowsheets (Taken 04/20/2020 0054)  Free from Infection during hospitalization:   Assess and monitor for signs and symptoms of infection   Monitor lab/diagnostic results   Monitor all insertion sites (i.e. indwelling lines, tubes, urinary catheters, and drains)     Problem: Pain  Goal: Pain at adequate level as identified by patient  Outcome: Progressing  Flowsheets (Taken 04/20/2020 0054)  Pain at adequate level as identified by patient:   Identify patient comfort function goal   Assess pain on admission, during daily assessment and/or before any "as needed" intervention(s)   Evaluate if patient comfort function goal is met   Include patient/patient care companion in decisions related to pain management as needed     Problem: Side Effects from Pain Analgesia  Goal: Patient will experience minimal side effects of analgesic therapy  Outcome: Progressing  Flowsheets (  Taken 04/20/2020 0054)  Patient will experience minimal side effects of analgesic therapy:   Monitor/assess patient's respiratory status (RR depth, effort, breath sounds)   Assess for changes in cognitive function     Problem: Discharge Barriers  Goal: Patient will be discharged home or other facility with appropriate resources  Outcome: Progressing  Flowsheets (Taken 04/20/2020 0054)  Discharge to home or other facility with appropriate resources:   Provide appropriate patient education   Provide information on available health resources     Problem: Psychosocial and Spiritual Needs  Goal: Demonstrates ability to cope with hospitalization/illness  Outcome: Progressing  Flowsheets (Taken 04/20/2020 0054)  Demonstrates ability to cope with hospitalizations/illness:   Encourage verbalization of feelings/concerns/expectations   Provide quiet environment   Include patient/ patient care companion in decisions   Encourage participation in diversional activity     Problem: Altered GI  Function  Goal: Fluid and electrolyte balance are achieved/maintained  Outcome: Progressing  Flowsheets (Taken 04/20/2020 0054)  Fluid and electrolyte balance are achieved/maintained:   Monitor/assess lab values and report abnormal values   Provide adequate hydration   Assess for confusion/personality changes   Assess and reassess fluid and electrolyte status   Observe for cardiac arrhythmias  Goal: Elimination patterns are normal or improving  Outcome: Progressing  Flowsheets (Taken 04/20/2020 0054)  Elimination patterns are normal or improving:   Report abnormal assessment to physician   Administer treatments as ordered  Goal: Mobility/Activity is maintained at optimal level for patient  Outcome: Progressing  Flowsheets (Taken 04/20/2020 0054)  Mobility/activity is maintained at optimal level for patient:   Increase mobility as tolerated/progressive mobility   Plan activities to conserve energy, plan rest periods   Assess for changes in respiratory status, level of consciousness and/or development of fatigue

## 2020-04-20 NOTE — Progress Notes (Signed)
MEDICINE PROGRESS NOTE    Date Time: 04/20/20 10:32 AM  Patient Name: [facility] [name]  Attending Physician: Roxana Hires, MD    CC: Upset about having to quit marijuana use    Assessment:   Hospital Course  Kycen Spalla. is a 29 y.o. male with a PMHx of ?Bipolar Disorder, TBI, ETOH Abuse-in remission, Daily Marijuana use, admitted 04/19/2020 to observation for intractable N/V, recently d/c'd from [facility] ER.  Admission, patient was found to have significant leukocytosis with WBC 27, hemoconcentrated with H/H 18.6/54.3, AKI with creatinine 6.5, hyponatremia NA 149, hyperkalemia K+ 5.2, anion gap metabolic acidosis.  UDS positive for cannabinoid.  Patient noted to have blood in UA.  CK checked showed mild rhabdomyolysis with CK 2000.  Patient was started on aggressive IV fluids.  Patient was noted to be agitated taking frequent showers and impulsive/manic behavior.  Psychiatry was consulted.  He was started on scheduled Zyprexa and sitter was ordered.    Interval History:  8/22: AKI and leukocytosis improving.  Continues aggressive IV fluids.  Psychiatry consulted.  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 Hx Bioplar Disorder  #History of TBI  #Substance abuse  -Appreciate psychiatry recommendations; recommends one-to-one sitter  -Hold home trazodone and Zoloft  -Start Zyprexa 5 mg twice daily  -Can use IM Zyprexa 5 to 10 mg as needed for agitation up to 2 times daily  -If agitated overnight would call patient's mom and she usually helps redirect patient  -If  attempts to leave would call [facility] for eval  -Psychiatry to reeval tomorrow    Expected : Discharge likely in 1-2 days pending clinical course  DVT ppx: HEPARIN and SCD  Interpreter: no, not indicated  Code Status: FULL CODE  Diet: Regular Diet    Please see attending note that follows this mid-level encounter note.   Review of Systems:   Review of Systems - Negative except Nausea/vomiting improved, complains of constipation, did not sleep overnight  ROS  Physical Exam:   Physical Exam  Vitals and nursing note reviewed.   HENT:      Head: Normocephalic and atraumatic.   Cardiovascular:      Rate and Rhythm: Normal rate and regular rhythm.   Pulmonary:      Effort: Pulmonary effort is normal. No respiratory distress.      Breath sounds: No wheezing.   Abdominal:      General: Bowel sounds are normal. There is no distension.      Palpations: Abdomen is soft.      Tenderness: There is no abdominal tenderness.   Musculoskeletal:      Cervical back: Neck supple.   Neurological:      Mental Status: He is alert.   Psychiatric:         Attention and Perception: He is inattentive. He perceives auditory hallucinations.         Mood and Affect: Affect normal.         Behavior: Behavior is hyperactive. Behavior is cooperative.         Judgment: Judgment is impulsive.  VITAL SIGNS   Temp:  [97.7 F (36.5 C)-98.5 F (36.9 C)] 97.9 F (36.6 C)  Heart Rate:  [78-99] 94  Resp Rate:  [16-18] 16  BP: (90-137)/(53-97) 90/53  Blood Glucose:      No intake or output data in the 24 hours ending 04/20/20 1032       Meds:     Current Facility-Administered Medications   Medication Dose Route Frequency    gabapentin  100 mg Oral TID    heparin (porcine)  5,000 Units Subcutaneous Q12H [facility]    sertraline  25 mg Oral Daily    traZODone  50 mg Oral QHS     Labs:     Recent Labs     04/19/20  1939   WBC 27.34*   Hgb 18.6*   Hematocrit 54.3*   Platelets 332   MCV 91.7     Recent Labs     04/20/20  0200 04/19/20  1723   Sodium 149* 149*    Potassium 5.7* 5.2*   Chloride 113* 105   CO2 18* 21*   BUN 49.0* 43.0*   Creatinine 5.1* 6.5*   Glucose 99 115*   Calcium 10.4 12.3*   Magnesium 2.9*  --      Recent Labs     04/19/20  1723   AST (SGOT) 46*   ALT 26   Alkaline Phosphatase 86   Protein, Total 10.7*   Albumin 6.5*     No results for input(s): PTT, PT, INR in the last 72 hours.  Imaging personally reviewed.    Safety Checklist:     DVT prophylaxis:  CHEST guideline (See page e199S) Chemical and Mechanical   Foley:  [place] [facility] Foley protocol Not present   IVs:  Peripheral IV   PT/OT: Not needed   Daily CBC & or Chem ordered:  SHM/ABIM guidelines (see #5) Yes, due to clinical and lab instability   Reference for approximate charges of common labs: CBC auto diff - $76   BMP - $99   Mg - $79  Disposition:   Today's date: 04/20/2020   Admit Date: 04/19/2020  5:07 PM  Anticipated medical stability for discharge:Yellow - maybe tomorrow  Service status: Inpatient non-IMC Status: risk of readmission and new onset of end-organ disease that would require in hospital work up  Reason for ongoing hospitalization: IVF, Psych eval, med adjust   Anticipated discharge needs: Pending psych eval   I have discussed with Attending: Roxana Hires, MD  Signed by: Janyth Pupa, FNP  Adult Observation Unit Nurse Practitioner  [phone] or [phone] on NT6   I have Reviewed the interval history, images and pertinent test results and personally examined the patient and confirmed the major physical findings of the preceding mid-levels note. Agree with above

## 2020-04-20 NOTE — Plan of Care (Addendum)
Potassium of 5.7, 15 gram kayexalate ordered.  Chloride level of 113, IVF changed from NS to plasmalyte.  Recheck BMP in am.

## 2020-04-20 NOTE — Plan of Care (Signed)
Problem: Safety  Goal: Patient will be free from injury during hospitalization  Outcome: Progressing  Flowsheets (Taken 04/20/2020 1313)  Patient will be free from injury during hospitalization:   Assess patient's risk for falls and implement fall prevention plan of care per policy   Use appropriate transfer methods   Include patient/ family/ care giver in decisions related to safety   Assess for patients risk for elopement and implement Elopement Risk Plan per policy  Goal: Patient will be free from infection during hospitalization  Outcome: Progressing  Flowsheets (Taken 04/20/2020 1313)  Free from Infection during hospitalization:   Monitor lab/diagnostic results   Monitor all insertion sites (i.e. indwelling lines, tubes, urinary catheters, and drains)     Problem: Pain  Goal: Pain at adequate level as identified by patient  Outcome: Progressing  Flowsheets (Taken 04/20/2020 1313)  Pain at adequate level as identified by patient:   Identify patient comfort function goal   Assess pain on admission, during daily assessment and/or before any "as needed" intervention(s)   Evaluate patient's satisfaction with pain management progress     Problem: Side Effects from Pain Analgesia  Goal: Patient will experience minimal side effects of analgesic therapy  Outcome: Progressing  Flowsheets (Taken 04/20/2020 1313)  Patient will experience minimal side effects of analgesic therapy:   Monitor/assess patient's respiratory status (RR depth, effort, breath sounds)   Assess for changes in cognitive function   Prevent/manage side effects per LIP orders (i.e. nausea, vomiting, pruritus, constipation, urinary retention, etc.)     Problem: Discharge Barriers  Goal: Patient will be discharged home or other facility with appropriate resources  Outcome: Progressing  Flowsheets (Taken 04/20/2020 1313)  Discharge to home or other facility with appropriate resources:   Provide appropriate patient education   Initiate discharge planning      Problem: Psychosocial and Spiritual Needs  Goal: Demonstrates ability to cope with hospitalization/illness  Outcome: Progressing  Flowsheets (Taken 04/20/2020 1313)  Demonstrates ability to cope with hospitalizations/illness: Provide quiet environment     Problem: Altered GI Function  Goal: Fluid and electrolyte balance are achieved/maintained  Outcome: Progressing  Flowsheets (Taken 04/20/2020 1313)  Fluid and electrolyte balance are achieved/maintained:   Monitor/assess lab values and report abnormal values   Assess for confusion/personality changes   Assess and reassess fluid and electrolyte status   Observe for seizure activity and initiate seizure precautions if indicated   Observe for cardiac arrhythmias   Monitor for muscle weakness  Goal: Elimination patterns are normal or improving  Outcome: Progressing  Flowsheets (Taken 04/20/2020 1313)  Elimination patterns are normal or improving: Report abnormal assessment to physician  Goal: Mobility/Activity is maintained at optimal level for patient  Outcome: Progressing  Flowsheets (Taken 04/20/2020 1313)  Mobility/activity is maintained at optimal level for patient: Plan activities to conserve energy, plan rest periods     Problem: Moderate/High Fall Risk Score >5  Goal: Patient will remain free of falls  Outcome: Progressing  Flowsheets (Taken 04/19/2020 2236 by Wu, Helen, RN)  Moderate Risk (6-13):   LOW-Fall Interventions Appropriate for Low Fall Risk   LOW-Anticoagulation education for injury risk   MOD-Consider activation of bed alarm if appropriate   MOD-Apply bed exit alarm if patient is confused   MOD-Floor mat at bedside (where available) if appropriate   MOD-Consider a move closer to Nurses Station   MOD-Remain with patient during toileting

## 2020-04-20 NOTE — Progress Notes (Signed)
Case Management Department        04/20/20 1554   CM Review   Acknowledgment of Outpatient/Observation Observation letter given       Patient Joel [name]. opt out of signing letter. CMA left a copy of the letter with patient.     Will De Burrs   Case Management Asistant  Case Management Department  [facility]  P: [phone]

## 2020-04-20 NOTE — Progress Notes (Addendum)
Adult Observation Progress Note      Shift Note:    Report received from outgoing RN at bedside.  Patient AOx4.  Patient extremely restless.  Requests a shower "to help him relax."  IV covered, shower taken.  Patient pacing in room, verbalizing he needs to walk the halls.  Walked with patient who verbalizes he needs to take another shower.    0730: Shower  0830: Shower   1100: Shower  1130: Shower  1245: Patient impulsive: disconnects IV fluids and  goes shower.  1330: Emesis w/o nausea.  Patient agreeable to taking Zofran before he eats and will let RN know before he eats.  1345: Zofran administered  1400: Shower  1530: Shower  1600: Emesis w/o nausea  1630: Shower  1700: Patient locks himself in bathroom.  Rate of IV fluids changed when he exited.  Rate returned to reflect order.  Charge notified and sitter ordered  Patient insistent on taking a shower and was told no more showers for the remainder of this shift.  Heating pads and warm blankets provided.    Patient agreeable to setting realistic expectations regarding shower frequency with their night nurse and technician.      BM during shift: No    Pending Orders: IV fluids 0.45% NaCl running continuous at 125 ml/hr.    Discharge Plan: Pending medical clearance    Social/Family Visits: None    POC update:  Plan of care updated with patient      Vitals:    04/20/20 0357 04/20/20 0818 04/20/20 1101 04/20/20 1346   BP: 100/64 90/53 (!) 144/101 (!) 143/92   Pulse: 98 94 79 87   Resp: 16 16     Temp: 97.9 F (36.6 C)      TempSrc: Oral      SpO2: 98% 100%     Weight:       Height:           Patient Lines/Drains/Airways Status    Active Lines, Drains and Airways     Name:   Placement date:   Placement time:   Site:   Days:    Peripheral IV 04/19/20 20 G Anterior;Distal;Right Upper Arm   04/19/20    1805    Upper Arm   less than 1

## 2020-04-20 NOTE — Plan of Care (Addendum)
Attending Attestation:   I have reviewed the interval history, images and pertinent test results and personally examined the patient, confirmed the major physical findings and discussed the assessment/plan with Annamarie Dawley, NP. Full note to follow.    Assessment:  2 Y M w/h/o TBI and chronic cannabis use, previously dx w/ cannabis hyperemesis syndrome and recurrent hospitalizations for N/V w/ dehydration and AKI admitted 8/21 w/ refractory N/V and AKI. Cr 6.5, K 5.2. Hemoconcentrated w/ Na 149, H/H 18/54. WBC 27. Admitted and started in IVF, antiemetics. Restless/agitated overnight, walking in halls unclothed, requesting to take frequent showers, manipulating IVF pump. Suspect underlying psychiatric disorder contributing to frequent cannabis use which has resulted in recurrent hyperemesis syndrome.      Acute kidney injury - suspect dehydration despite active UA. On prior admission ANCAs neg, hepatitis neg, ANA weakly positive but ANA panel and C3/C4 wnl. Historically resolves w/ 24-48h IVF.    Nausea/vomiting attributed to cannabis hyperemesis syndrome   Leukocytosis - inflammatory response. 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   - Advise Zyprexa 5mg  BID and prn. Change to depakote in 3 days if stable. Avoid benzos.   - Hold Zoloft/trazodone for now, can restart in 3 days if stable.   - 1:1 sitter for safety given impulsiveness per Psych  - Anticipate Cr will near baseline by AM at which point he could  medically be released. Per Psych's d/w NP, if pt wants to go prior, Psych recommends contacting [facility] to eval pt safety. Would ask pt's mom to help convince pt to stay for Psych re-eval in AM.       Roxana Hires, MD

## 2020-04-20 NOTE — UM Notes (Addendum)
04/19/20 2031    Adult Admit to Observation Once   Diagnosis: Dehydration   Level of Care: Acute   Patient Class: Observation       29 y.o. male who presents with nausea and vomiting found to have acute renal failure as well as other metabolic derangements likely from intractable vomiting    Intractable nausea vomiting  #Acute renal failure  #Hyponatremia  #Anion gap metabolic acidosis  #Leukocytosis  #Alcohol abuse patient states that he no longer drinks alcohol after his last admission here in April  #Marijuana use  #Bipolar disorder  #History of TBI    VS: 98.5, HR 78, RR 18, 102/69    LABS: WBC 27, hgb 18, plt 332,   Bun/cr 49/ 5.1 Na 149, k 5.7, chl 113, CO2 18   CK 2182    PLAN:   Telemetry  -Start IV fluids  -Trend BMP  -Check UDS  -Start as needed antiemetics  -Start other home medications        DAY 2, 04/20/20    Bun/cr 42/ 2.8  Na 146, K 4.3, chl 112     Psych consult:   RECOMMENDATIONS:   1. Safety:  Denied SI, HI, reported AH at points but no VH. Patient is exteremly impulsive , I recommend 1:1 sitter for unintentional self harming behaviors.       - Recommendations    Substance induced psychosis, Cannabis  Substance induced mood disoprder  Marijuana use disorder  Alcohol use disorder, in early  Remission  Historically TBI      - Zyprexa 5 mg Bid Daily as standing dose for 3 days and later PRN to control his mood, impulsivity and recurrent AH.  After 3 days, if patient is stable, switch to Depakote 250 mg BID and increase the dose accordingly  After 3 days , Zoloft can be continues if patient is stable  Patient has history of TBI, he may not benefit from long term use of Antipsychotic, but if psychosis continues we can adjust the dose (lower if possible) and continue Zyprexa but Depakote and Zoloft combination is  preferred.   He can be started on Trazodone for anxiety after 3 days, Trazodone does not have strong  anticholinergic effect.   Please avoid use of Benzodiazepine and strong  anticholinergic.      Cannabinoid hyperemesis syndrome, Marijuana induced hyperemesis  - Capsaicin  Patch, 3-4 patch per day. As patient is showering excessively, please notice that Capsacin patch must not be Applied immediately after shower      Patient is informed of and states understanding of potential risks and benefits of proposed treatment plan.    Disposition:   Patient should be re-evaluated after medically stable.       UTILIZATION REVIEW CONTACT: Name: Jarrett Soho, RN BSN  Clinical Case Manager  - Utilization Review  [facility]  Address:  [address] [place], [place]  [address]  NPI:   [id number]  Tax ID:  [id number]  Phone: [phone]  Fax: [phone]    Please use fax number [phone] to provide authorization for hospital services or to request additional information.
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
## 2020-04-20 NOTE — Progress Notes (Addendum)
0245: Patient found walking to the nurses station to ask for this RN regarding his UA - after unhooking his IV. This RN emphasized not to unhook his IV by himself.       0500: Patient later pressed call bell asking to increase the rate of the fluids. Charge RN answered saying we are not allowed to do that. This RN rounded on pt shortly after and found pt had increased IV rate to 900 mL/hr. RN once again told him that he is not allowed to change IV rate per MD's order.     RN will perform more frequent rounding.       1610: Patient seen walking the hallway with no shirt on, and once again changing the setting of the IV pump. After returning to the room, pt disconnected IV tubing and asked to take another shower. RN told patient once again that he is not allowed to touch IV tubing and pump.       0615: readministered kayexalate (pt vomited shortly after taking first dose). Will recheck BMP after pt has a BM.

## 2020-04-20 NOTE — Progress Notes (Signed)
0720:  Report received from outgoing RN at bedside.  Patient AOx4.  Patient extremely restless.  Requests a shower "to help him relax."  IV covered, shower taken.  Patient pacing in room, verbalizing he needs to walk the halls.  Walked with patient who verbalizes he needs to take another shower.    Attending notified and 1mg  Ativan requested.  Bedding and tubing changed while patient in shower.      Ativan administered.  IV reconnected to [employer].  Patient doses off to sleep 0820.

## 2020-04-21 DIAGNOSIS — R1115 Cyclical vomiting syndrome unrelated to migraine: Secondary | ICD-10-CM

## 2020-04-21 LAB — BASIC METABOLIC PANEL
Anion Gap: 11 (ref 5.0–15.0)
BUN: 31 mg/dL — ABNORMAL HIGH (ref 9.0–28.0)
CO2: 23 mEq/L (ref 22–29)
Calcium: 10 mg/dL (ref 8.5–10.5)
Chloride: 105 mEq/L (ref 100–111)
Creatinine: 1.5 mg/dL — ABNORMAL HIGH (ref 0.7–1.3)
Glucose: 105 mg/dL — ABNORMAL HIGH (ref 70–100)
Potassium: 3.7 mEq/L (ref 3.5–5.1)
Sodium: 139 mEq/L (ref 136–145)

## 2020-04-21 LAB — CBC AND DIFFERENTIAL
Absolute NRBC: 0 10*3/uL (ref 0.00–0.00)
Basophils Absolute Automated: 0.04 10*3/uL (ref 0.00–0.08)
Basophils Automated: 0.3 %
Eosinophils Absolute Automated: 0 10*3/uL (ref 0.00–0.44)
Eosinophils Automated: 0 %
Hematocrit: 45.8 % (ref 37.6–49.6)
Hgb: 15.1 g/dL (ref 12.5–17.1)
Immature Granulocytes Absolute: 0.07 10*3/uL (ref 0.00–0.07)
Immature Granulocytes: 0.5 %
Lymphocytes Absolute Automated: 1.69 10*3/uL (ref 0.42–3.22)
Lymphocytes Automated: 12.2 %
MCH: 30.3 pg (ref 25.1–33.5)
MCHC: 33 g/dL (ref 31.5–35.8)
MCV: 92 fL (ref 78.0–96.0)
MPV: 11.8 fL (ref 8.9–12.5)
Monocytes Absolute Automated: 0.61 10*3/uL (ref 0.21–0.85)
Monocytes: 4.4 %
Neutrophils Absolute: 11.4 10*3/uL — ABNORMAL HIGH (ref 1.10–6.33)
Neutrophils: 82.6 %
Nucleated RBC: 0 /100 WBC (ref 0.0–0.0)
Platelets: 249 10*3/uL (ref 142–346)
RBC: 4.98 10*6/uL (ref 4.20–5.90)
RDW: 15 % (ref 11–15)
WBC: 13.81 10*3/uL — ABNORMAL HIGH (ref 3.10–9.50)

## 2020-04-21 LAB — GFR: EGFR: >60

## 2020-04-21 LAB — MAGNESIUM: Magnesium: 2.7 mg/dL — ABNORMAL HIGH (ref 1.6–2.6)

## 2020-04-21 MED ORDER — ONDANSETRON 4 MG PO TBDP
4.0000 mg | ORAL_TABLET | Freq: Four times a day (QID) | ORAL | Status: DC
Start: 2020-04-21 — End: 2020-04-22
  Administered 2020-04-21 (×3): 4 mg via ORAL
  Filled 2020-04-21 (×4): qty 1

## 2020-04-21 MED ORDER — PROMETHAZINE HCL 25 MG PO TABS
12.5000 mg | ORAL_TABLET | Freq: Four times a day (QID) | ORAL | Status: DC | PRN
Start: 2020-04-21 — End: 2020-04-22
  Administered 2020-04-21: 14:00:00 12.5 mg via ORAL
  Filled 2020-04-21 (×2): qty 1

## 2020-04-21 MED ORDER — ONDANSETRON HCL 4 MG/2ML IJ SOLN
4.0000 mg | Freq: Four times a day (QID) | INTRAMUSCULAR | Status: DC
Start: 2020-04-21 — End: 2020-04-21

## 2020-04-21 MED ORDER — OLANZAPINE 5 MG PO TBDP
5.0000 mg | ORAL_TABLET | Freq: Every evening | ORAL | Status: DC
Start: 2020-04-22 — End: 2020-04-23
  Filled 2020-04-21 (×2): qty 1

## 2020-04-21 NOTE — Plan of Care (Signed)
Adult Observation Progress Note    Shift note: Patient received to shift at 1900. Bedside report received from outgoing RN.  Introduced self to patient and white board updated.  VSS and A&Ox4. No signs of distress at this time.  Pt rates pain 0/10. IV in place, infusing. Pt refusing tele. Fall level low with safety precautions in place. Will continue to monitor.     BM during shift:  Yes/No: No  If no; Date of Last BM: Pt states last BM was "2 days ago"    Pending Orders: IV fluids, psych reeval 1:1 sitter, AM labs    Discharge Plan:  TBD    Social/Family Visits:  N/A    POC update: Patient updated on plan of care, verbalized understanding.       Vitals:    04/20/20 1101 04/20/20 1346 04/20/20 1743 04/20/20 2027   BP: (!) 144/101 (!) 143/92 130/80 103/71   Pulse: 79 87 85 100   Resp:   15 17   Temp:   98.5 F (36.9 C) 99.4 F (37.4 C)   TempSrc:   Oral Oral   SpO2:   100% 99%   Weight:       Height:           Patient Lines/Drains/Airways Status      Active Lines, Drains and Airways       Name:   Placement date:   Placement time:   Site:   Days:    Peripheral IV 04/19/20 20 G Anterior;Distal;Right Upper Arm   04/19/20    1805    Upper Arm   1           Inactive Lines, Drains and Airways       None                     Problem: Safety  Goal: Patient will be free from injury during hospitalization  Outcome: Progressing  Flowsheets (Taken 04/20/2020 1313 by Renaye Rakers, RN)  Patient will be free from injury during hospitalization:   Assess patient's risk for falls and implement fall prevention plan of care per policy   Use appropriate transfer methods   Include patient/ family/ care giver in decisions related to safety   Assess for patients risk for elopement and implement Elopement Risk Plan per policy  Goal: Patient will be free from infection during hospitalization  Outcome: Progressing  Flowsheets (Taken 04/20/2020 1313 by Renaye Rakers, RN)  Free from Infection during hospitalization:   Monitor lab/diagnostic  results   Monitor all insertion sites (i.e. indwelling lines, tubes, urinary catheters, and drains)     Problem: Pain  Goal: Pain at adequate level as identified by patient  Outcome: Progressing  Flowsheets (Taken 04/20/2020 1313 by Renaye Rakers, RN)  Pain at adequate level as identified by patient:   Identify patient comfort function goal   Assess pain on admission, during daily assessment and/or before any "as needed" intervention(s)   Evaluate patient's satisfaction with pain management progress     Problem: Side Effects from Pain Analgesia  Goal: Patient will experience minimal side effects of analgesic therapy  Outcome: Progressing  Flowsheets (Taken 04/20/2020 1313 by Renaye Rakers, RN)  Patient will experience minimal side effects of analgesic therapy:   Monitor/assess patient's respiratory status (RR depth, effort, breath sounds)   Assess for changes in cognitive function   Prevent/manage side effects per LIP orders (i.e. nausea, vomiting, pruritus, constipation, urinary retention, etc.)  Problem: Discharge Barriers  Goal: Patient will be discharged home or other facility with appropriate resources  Outcome: Progressing  Flowsheets (Taken 04/20/2020 1313 by Renaye Rakers, RN)  Discharge to home or other facility with appropriate resources:   Provide appropriate patient education   Initiate discharge planning     Problem: Psychosocial and Spiritual Needs  Goal: Demonstrates ability to cope with hospitalization/illness  Outcome: Progressing  Flowsheets (Taken 04/20/2020 1313 by Renaye Rakers, RN)  Demonstrates ability to cope with hospitalizations/illness: Provide quiet environment     Problem: Altered GI Function  Goal: Fluid and electrolyte balance are achieved/maintained  Outcome: Progressing  Flowsheets (Taken 04/20/2020 1313 by Renaye Rakers, RN)  Fluid and electrolyte balance are achieved/maintained:   Monitor/assess lab values and report abnormal values   Assess for confusion/personality changes    Assess and reassess fluid and electrolyte status   Observe for seizure activity and initiate seizure precautions if indicated   Observe for cardiac arrhythmias   Monitor for muscle weakness  Goal: Elimination patterns are normal or improving  Outcome: Progressing  Flowsheets (Taken 04/20/2020 1313 by Renaye Rakers, RN)  Elimination patterns are normal or improving: Report abnormal assessment to physician  Goal: Mobility/Activity is maintained at optimal level for patient  Outcome: Progressing  Flowsheets (Taken 04/20/2020 1313 by Renaye Rakers, RN)  Mobility/activity is maintained at optimal level for patient: Plan activities to conserve energy, plan rest periods     Problem: Moderate/High Fall Risk Score >5  Goal: Patient will remain free of falls  Outcome: Progressing  Flowsheets (Taken 04/19/2020 2236 by Lajuan Lines, RN)  Moderate Risk (6-13):   LOW-Fall Interventions Appropriate for Low Fall Risk   LOW-Anticoagulation education for injury risk   MOD-Consider activation of bed alarm if appropriate   MOD-Apply bed exit alarm if patient is confused   MOD-Floor mat at bedside (where available) if appropriate   MOD-Consider a move closer to Nurses Station   MOD-Remain with patient during toileting

## 2020-04-21 NOTE — Progress Notes (Incomplete)
Adult Observation Progress Note      Shift Note:    Report received from outgoing RN at bedside. Patient in shower.  Patient AOx4.     Patient mildly hypertensive, otherwise VSS.  IV removed by patient.  Patient non-compliant with telemetry unit.      Patient showers 8x    Patient has 3 episodes of emesis in the morning and 2 episodes in the afternoon.  Patient placed on clear liquid diet.  Patient is very good about drinking ice water (drinking over 1.5L).    Patient verbalizes that the Zostrix topic cream provides relief.    BM during shift:  No (but patient has not been able to keep solids down)    Pending Orders:     Discharge Plan:    Social/Family Visits: Mother at bedside    POC update:       Vitals:    04/20/20 1743 04/20/20 2027 04/21/20 0827 04/21/20 1641   BP: 130/80 103/71 (!) 143/97 (!) 153/92   Pulse: 85 100 89 73   Resp: 15 17 16 17    Temp: 98.5 F (36.9 C) 99.4 F (37.4 C) 98.7 F (37.1 C) 100.3 F (37.9 C)   TempSrc: Oral Oral Oral Oral   SpO2: 100% 99% 99% 100%   Weight:       Height:           Patient Lines/Drains/Airways Status    Active Lines, Drains and Airways     None

## 2020-04-21 NOTE — Progress Notes (Signed)
Patient has emesis x2 (bile-like/ green in color) between 1200 and 1800.  Meal of clear liquids distributed at 1800.

## 2020-04-21 NOTE — Progress Notes (Addendum)
Pt is refusing AM labs and vitals. Refuses to come out of shower at this time.    IV was pulled out by patient. Two techs attempted to place new IV and draw AM labs. Pt is non-compliant and attempts were unsuccessful. Will inform day team.

## 2020-04-21 NOTE — Consults (Signed)
PSYCHIATRIC CONSULTATION PROGRESS NOTE    Patient name:  Joel [name], Joel [name]  Date of birth:  August 12, 1991  Age:  29 y.o.  MRN:  [id number]  CSN:  [id number]  Date of admission:  04/19/2020  Date of consultation:  04/21/2020  Attending/Referring physician:  Roxana Hires, MD  Consulting Attending: Dr. Sherril Croon  Consulting Fellow: Judie Bonus. Truett, DO  Consulting Resident: Ina Homes, MD  ==================================================  REASON FOR ADMISSION:  AKI    REASON FOR CONSULTATION:  Psychiatric Evaluation (concern for acute mania/psychosis)    Interval History:     Overnight Events: Patient has been pacing, walking the halls, sometimes partially dressed, and showering very frequently. He refused labs and vitals on at least one occasion, and has adjusted IV fluid pumps and pulled out IV lines with the intent to shower to alleviate symptoms.   PRN's Given in last 24 hours: IV crystalloids only    Pt prone under blanket in bed, appears somnolent and in NAD. He says he is in a different hospital (was woken from sleep to answer this), but is otherwise alert and oriented. Twice during the interview, he closed his eyes and required reawakening. He describes his mood as "fucked" due to his constant nausea (except in the shower), but reports that he is feeling better overall. Despite olanzapine initiation, he continues to report intermittent auditory hallucination of his grandmother calling his name (she passed away ~4 months ago), but denies distress from this or commands to hurt self/others. He denies any suicidal or homicidal ideation. He reports that he had been prescribed sertraline and trazodone during a previous hospitalization, but has not currently been taking either at home. He is ambivalent about stopping marijuana use, stating that his emesis and mood symptoms resolve when stopping and start again after approx three months of heavy use. He denies SI/HI.    Medications:   Current medications and  doses:    Current Facility-Administered Medications   Medication Dose Route Frequency Provider Last Rate Last Admin    acetaminophen (TYLENOL) tablet 650 mg  650 mg Oral Q6H PRN Dorcas Carrow, MD        Or    acetaminophen (TYLENOL) suppository 650 mg  650 mg Rectal Q6H PRN Dorcas Carrow, MD        bisacodyl (DULCOLAX) suppository 10 mg  10 mg Rectal Daily PRN Janyth Pupa, FNP        capsaicin (ZOSTRIX) 0.025 % topical cream   Topical TID Janyth Pupa, FNP   Given at 04/20/20 2135    dextrose (GLUCOSE) 40 % oral gel 15 g of glucose  15 g of glucose Oral PRN Dorcas Carrow, MD        And    dextrose 50 % bolus 12.5 g  12.5 g Intravenous PRN Dorcas Carrow, MD        And    glucagon (rDNA) (GLUCAGEN) injection 1 mg  1 mg Intramuscular PRN Dorcas Carrow, MD        gabapentin (NEURONTIN) capsule 100 mg  100 mg Oral TID Dorcas Carrow, MD   100 mg at 04/21/20 0555    heparin (porcine) injection 5,000 Units  5,000 Units Subcutaneous Q12H [facility] Dorcas Carrow, MD   5,000 Units at 04/21/20 8295    melatonin tablet 6 mg  6 mg Oral QHS PRN Dorcas Carrow, MD   6 mg at 04/20/20 2143    naloxone [facility]) injection 0.2 mg  0.2 mg Intravenous PRN Dorcas Carrow, MD        OLANZapine (ZyPREXA) injection 5 mg  5 mg Intramuscular Once PRN Janyth Pupa, FNP        OLANZapine zydis (ZyPREXA ZYDIS) disintegrating tablet 5 mg  5 mg Oral BID Janyth Pupa, FNP   5 mg at 04/21/20 0955    ondansetron (ZOFRAN-ODT) disintegrating tablet 4 mg  4 mg Oral Q6H Melany Guernsey, FNP   4 mg at 04/21/20 1478    Or    ondansetron (ZOFRAN) injection 4 mg  4 mg Intravenous Q6H Melany Guernsey, FNP        polyethylene glycol (MIRALAX) packet 17 g  17 g Oral Daily Janyth Pupa, FNP   17 g at 04/21/20 2956         EXAMINATION:  General Appearance:   African American male neatly groomed, appropriately and adequately nourished under blanket for duration of interview     Behavior:  Calm, Somnolent, Eye Contact is  Poor due to frequent eye closure   Speech:  Normal rate, normal volume, fluid, purposeful; interruptible with normal pitch   Mood:  " fucked "   Affect:  Appropriate/mood-congruent   Thought Process:  Linear, goal directed and Coherent/logical   Thought content: No suicidal thoughts, No homicidal thoughts, No delusions elicited   Perceptions:  +AH nondistressing, nonviolent     Orientation:  Oriented to self Oriented to time Oriented to situation Not oriented to specific hospital      Attention and Concentration:  grossly intact   Memory:  Remote intact and Recent intact    Language:  Intact     Fund of knowledge:  Intact   Insight:  Good   Judgment:  Good   Physical Exam:  Musculoskeletal:  Muscle bulk and tone are grossly intact; without Tremors or rigidity   Strength: not assessed   Neurological:   Sensory examination, Coordination, Reflexes: not assessed.   Gait:    Pt did not ambulate for team.   Most recent physical exam as documented in the medical record has been reviewed.    Review of Systems:   ROS positive for:  Pertinent items are noted in HPI..   All other pertinent systems were reviewed and are negative unless mentioned in HPI.   Also reviewed medical ROS documented by primary team and other services.     Vital Signs:     Vitals:    04/21/20 0827   BP: (!) 143/97   Pulse: 89   Resp: 16   Temp: 98.7 F (37.1 C)   SpO2: 99%        Laboratory Assessment:     Results     Procedure Component Value Units Date/Time    GFR [[id number]] Collected: 04/21/20 0831     Updated: 04/21/20 0902     EGFR >60.0    Basic Metabolic Panel [[id number]]  (Abnormal) Collected: 04/21/20 0831    Specimen: Blood Updated: 04/21/20 0902     Glucose 105 mg/dL      BUN 41.3 mg/dL      Creatinine 1.5 mg/dL      Calcium 24.4 mg/dL      Sodium 010 mEq/L      Potassium 3.7 mEq/L      Chloride 105 mEq/L      CO2 23 mEq/L      Anion Gap 11.0    Magnesium [[id number]]  (Abnormal) Collected: 04/21/20 0831    Specimen:  Blood Updated: 04/21/20  0902     Magnesium 2.7 mg/dL     CBC and differential [[id number]]  (Abnormal) Collected: 04/21/20 0831    Specimen: Blood Updated: 04/21/20 0859     WBC 13.81 x10 3/uL      Hgb 15.1 g/dL      Hematocrit 40.9 %      Platelets 249 x10 3/uL      RBC 4.98 x10 6/uL      MCV 92.0 fL      MCH 30.3 pg      MCHC 33.0 g/dL      RDW 15 %      MPV 11.8 fL      Neutrophils 82.6 %      Lymphocytes Automated 12.2 %      Monocytes 4.4 %      Eosinophils Automated 0.0 %      Basophils Automated 0.3 %      Immature Granulocytes 0.5 %      Nucleated RBC 0.0 /100 WBC      Neutrophils Absolute 11.40 x10 3/uL      Lymphocytes Absolute Automated 1.69 x10 3/uL      Monocytes Absolute Automated 0.61 x10 3/uL      Eosinophils Absolute Automated 0.00 x10 3/uL      Basophils Absolute Automated 0.04 x10 3/uL      Immature Granulocytes Absolute 0.07 x10 3/uL      Absolute NRBC 0.00 x10 3/uL     Protein / creatinine ratio, urine [[id number]]  (Abnormal) Collected: 04/20/20 1358    Specimen: Urine Updated: 04/20/20 1653     Urine Protein Random 31.9 mg/dL      Urine Creatinine, Random 232.8 mg/dL      Urine Protein/Creatinine Ratio 0.1        DIAGNOSTIC STUDIES:  No results found for any visits on 04/19/20.    QTc: 518 with a HR of 102 performed on [id number]    Assessment/Plan     DIAGNOSES:  Substance induced psychosis, Cannabis  Substance induced mood disorder (Cannabis induced)  Marijuana use disorder, severe  Cannabinoid hyperemesis syndrome, Marijuana induced hyperemesis  Alcohol use disorder, in early  Remission  History of TBI  Concern for bereavement     ASSESSMENT AND RECOMMENDATIONS:   [name]. is a 28 y.o.malew history of TBI, alcohol abuse, daily regular marijuana use, reported history of bipolar disorder (However, not supported by history and symptoms confounded with marijuana use)who presents with intractable nausea and vomiting; CL Team was consulted over the weekend for impulsivity, and Zyprexa 5mg  BID was  started for impulsivity.     He reported daily use of Marijuana 1gr at least per day regularly for "always", per mom for 10 years.  He reported mood symptoms such as impulsivity and irritability, anxiety and disturbed sleep but the Sxs did not match with mania and he always used substances while presented with Sxs. Recently (April 8) he lost a loving grandmother and has been hearing her voice sporadically, a symptom which did not resolve with antipsychotics, is ego syntonic, and is more consistent with bereavement than a primary psychotic disorder.   Rhabdomyolysis and AKI maybe due to hyperemesis and impulsive behaviors and have resolved with IVF.     23AUG: Patient remains nauseated outside of shower, but lab tests suggest AKI has nearly resolved. Patient's overall clinical picture (somnolence, lack of pressured speech) is not consistent with acute mania; agitation and interference with IV lines are likely due  to distressing symptoms of hyperemesis, with potential for some contribution of TBI causing impulsivity. Auditory hallucinations are consistent with bereavement given their featuring of recently deceased relative's voice, nondistressing nature, and failure to resolve with olanzapine. No need for acute inpatient psychiatric hospitalization at this time. Would hold off on antipsychotic medications at this time on discharge, but encourage substance avoidance as this will likely cause a return of the hyperemesis and lead to further impulsivity.    From UptoDate:  Hyperemesis syndromeCannabinoid hyperemesis syndrome is a well-defined but apparently relatively rare syndrome involving episodic severe nausea and vomiting and abdominal pain which is relieved by exposure to hot water (shower or bath) . Topical capsaicin has shown some benefit, but standard antiemetics and antidopamine agents are of little or no value . The pathophysiology remains unknown, but patients are almost always daily cannabis users for at  least one year and symptoms resolve within one to two days of cessation of cannabis use      RECOMMENDATIONS:   1. Safety:  Denied SI, HI, reported AH at points but no VH. Patient is impulsive, recommend 1:1 sitter for unintentional self harming behaviors until nursing feels comfortable that patient will not be disruptive or until medical condition improves/is ready for discharge.     2. Substance induced mood disorder  Marijuana use disorder  Alcohol use disorder, in early  Remission  Historically TBI    -Change Zyprexa 5 mg to QHS for remainder of stay for agitation/impulsivity, discontinue at discharge. Patient is not acutely manic at this time, suspect SUD causing impulsivity.   -Discontinue sertraline and trazodone as patient does not regularly take in O/P.   -Inpatient CATS consult due to >6 hospital stays for hyperemesis 2/2 marijuana use       3. Cannabinoid hyperemesis syndrome, Marijuana induced hyperemesis  - Consider Capsaicin  Patch, 3-4 patch per day. As patient is showering excessively, please notice that Capsacin patch must not be applied immediately after shower    Patient is informed of and states understanding of potential risks and benefits of proposed treatment plan.              Disposition:   No indication for acute inpatient psychiatric hospitalization at this time.   Psychiatry consult team will peripherally follow and will be available for questions or if acute issues arise   Thank you for allowing [place] to participate in the care of your patient.    Signed by:  Sheliah Hatch  Department of Consultation Liaison Psychiatry  Date/Time: 04/21/20 / 12:22 PM  Office: [phone]    J. Lindefjeld, MD  Psychiatry PGY-2  April 21, 2020       ADDENDUM:  Discussed case with the above PGY2 J. Lindefjeld, and I have interviewed the patient and verified the above information is correct and accurate as well as I have also reviewed the chart, records, and labs.  Suicide Risk Assessment  Performed:  Yes, see above.  I agree with the above note and recommendations, with edits made for clarification prior to signing the note.   Thank you for this consult.    Signed by:  Carma Lair, MD  Department of Consultation Liaison Psychiatry  Pager#: [phone]  Psychiatry Office: [phone] (8:30-4:30pm)  Date/Time: 04/21/20 5:04 PM

## 2020-04-21 NOTE — Plan of Care (Signed)
Problem: Safety  Goal: Patient will be free from injury during hospitalization  Outcome: Progressing  Flowsheets (Taken 04/20/2020 1313)  Patient will be free from injury during hospitalization:   Assess patient's risk for falls and implement fall prevention plan of care per policy   Use appropriate transfer methods   Include patient/ family/ care giver in decisions related to safety   Assess for patients risk for elopement and implement Elopement Risk Plan per policy  Goal: Patient will be free from infection during hospitalization  Outcome: Progressing  Flowsheets (Taken 04/20/2020 1313)  Free from Infection during hospitalization:   Monitor lab/diagnostic results   Monitor all insertion sites (i.e. indwelling lines, tubes, urinary catheters, and drains)     Problem: Pain  Goal: Pain at adequate level as identified by patient  Outcome: Progressing  Flowsheets (Taken 04/20/2020 1313)  Pain at adequate level as identified by patient:   Identify patient comfort function goal   Assess pain on admission, during daily assessment and/or before any "as needed" intervention(s)   Evaluate patient's satisfaction with pain management progress     Problem: Side Effects from Pain Analgesia  Goal: Patient will experience minimal side effects of analgesic therapy  Outcome: Progressing  Flowsheets (Taken 04/20/2020 1313)  Patient will experience minimal side effects of analgesic therapy:   Monitor/assess patient's respiratory status (RR depth, effort, breath sounds)   Assess for changes in cognitive function   Prevent/manage side effects per LIP orders (i.e. nausea, vomiting, pruritus, constipation, urinary retention, etc.)     Problem: Discharge Barriers  Goal: Patient will be discharged home or other facility with appropriate resources  Outcome: Progressing  Flowsheets (Taken 04/20/2020 1313)  Discharge to home or other facility with appropriate resources:   Provide appropriate patient education   Initiate discharge planning      Problem: Psychosocial and Spiritual Needs  Goal: Demonstrates ability to cope with hospitalization/illness  Outcome: Progressing  Flowsheets (Taken 04/20/2020 1313)  Demonstrates ability to cope with hospitalizations/illness: Provide quiet environment     Problem: Altered GI Function  Goal: Fluid and electrolyte balance are achieved/maintained  Outcome: Progressing  Flowsheets (Taken 04/20/2020 1313)  Fluid and electrolyte balance are achieved/maintained:   Monitor/assess lab values and report abnormal values   Assess for confusion/personality changes   Assess and reassess fluid and electrolyte status   Observe for seizure activity and initiate seizure precautions if indicated   Observe for cardiac arrhythmias   Monitor for muscle weakness  Goal: Elimination patterns are normal or improving  Outcome: Progressing  Flowsheets (Taken 04/20/2020 1313)  Elimination patterns are normal or improving: Report abnormal assessment to physician  Goal: Mobility/Activity is maintained at optimal level for patient  Outcome: Progressing  Flowsheets (Taken 04/20/2020 1313)  Mobility/activity is maintained at optimal level for patient: Plan activities to conserve energy, plan rest periods     Problem: Moderate/High Fall Risk Score >5  Goal: Patient will remain free of falls  Outcome: Progressing  Flowsheets (Taken 04/19/2020 2236 by Lajuan Lines, RN)  Moderate Risk (6-13):   LOW-Fall Interventions Appropriate for Low Fall Risk   LOW-Anticoagulation education for injury risk   MOD-Consider activation of bed alarm if appropriate   MOD-Apply bed exit alarm if patient is confused   MOD-Floor mat at bedside (where available) if appropriate   MOD-Consider a move closer to Nurses [facility]   MOD-Remain with patient during toileting

## 2020-04-21 NOTE — Plan of Care (Addendum)
Attending Attestation:   I have reviewed the interval history, images and pertinent test results and personally examined the patient, confirmed the major physical findings and discussed the assessment/plan with Shauna Hugh, NP. Full note to follow.    Assessment:  61 Y M w/h/o TBI and chronic cannabis use, previously dx w/ cannabis hyperemesis syndrome and recurrent hospitalizations for N/V w/ dehydration and AKI admitted 8/21 w/ refractory N/V and AKI. Cr 6.5, K 5.2. Hemoconcentrated w/ Na 149, H/H 18/54. WBC 27. Admitted and started in IVF, antiemetics. Restless/agitated during admission, walking in halls unclothed, requesting to take frequent showers, manipulating IVF pump. Has removed IV (despite sitter in place). Suspect underlying psychiatric disorder contributing to frequent cannabis use which has resulted in recurrent hyperemesis syndrome. While labs have improved (Cr trending down) and he is able to tolerate liquids, he is still having recurrent emesis putting him at risk for recurrent AKI.      Acute kidney injury - suspect dehydration despite active UA. On prior admission ANCAs neg, hepatitis neg, ANA weakly positive but ANA panel and C3/C4 wnl. Historically resolves w/ 24-48h IVF.    Nausea/vomiting attributed to cannabis hyperemesis syndrome - ongoing   Leukocytosis - inflammatory response, improved. 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 Encourage continued oral hydration.   - Schedule PO zofran q6h w/ oral phenergan prn  - Psych consulted,  appreciate recs.   - Advise Zyprexa 5mg  BID and prn. Change to depakote in 3 days if stable. Avoid benzos.   - Hold Zoloft/trazodone for now, can restart in after days if stable.   - 1:1 sitter for safety given impulsiveness per Psych  - If pt tolerates PO today could be medically released. Per psych note, psych to re-eval prior to discharge. Pt may benefit from inpt psychiatric stay for continued med titration, defer to psych      Roxana Hires, MD

## 2020-04-21 NOTE — Progress Notes (Signed)
MEDICINE PROGRESS NOTE    Date Time: 04/21/20 5:54 PM  Patient Name: [name]  Attending Physician: Roxana Hires, MD    CC: Upset about having to quit marijuana use    Assessment:   Hospital Course  Joel [name]. is a 29 y.o. male with a PMHx of ?Bipolar Disorder, TBI, ETOH Abuse-in remission, Daily Marijuana use, admitted 04/19/2020 to observation for intractable N/V, recently d/c'd from [facility].  Admission, patient was found to have significant leukocytosis with WBC 27, hemoconcentrated with H/H 18.6/54.3, AKI with creatinine 6.5, hyponatremia NA 149, hyperkalemia K+ 5.2, anion gap metabolic acidosis.  UDS positive for cannabinoid.  Patient noted to have blood in UA.  CK checked showed mild rhabdomyolysis with CK 2000.  Patient was started on aggressive IV fluids.  Patient was noted to be agitated taking frequent showers and impulsive/manic behavior.  Psychiatry was consulted.  He was started on scheduled Zyprexa and sitter was ordered. His Zyprexa was further adjusted. Psych recommended IP CATs consult for hyperemesis 2/2 self medication w/marijuana. Also recommended stopping his home sertraline and trazodone since he does not take it regularly. The patient did not tolerate a regular diet, so antiemetics were adjusted and diet changed to CLD.     Interval History:  8/22: AKI and leukocytosis improving.  Continues aggressive IV fluids.  Psychiatry consulted.  8/23: multiple episodes of vomiting. CLD started and antiemetics adjusted. CATS consult enter per psych recommendations  Plan:   #Leukocytosis-resolving, suspect reactive  #Suspected hyperemesis cannabinoid syndrome  #Persistent n/v  -CATs consult entered for hyperemesis 2/2 marijuana use  -Capsaicin cream 3 times daily  -Warm showers improve n/v  -Zofran changed to scheduled and started phenergan PO PRN   -Changed regular diet to CLD  -Marijuana cessation  -Trend CBC, monitor for fever  -[place] pending tolerating diet    #Manic  behavior; ? Hx Bioplar Disorder  #History of TBI  #Substance abuse  -Appreciate psychiatry recommendations; recommends one-to-one sitter  -Stop home trazodone and Zoloft since patient is not taking regularly outside of hospital  -Change Zyprexa to 5 mg QHS, discontinue at [place]  -Recommended IP CATs for hyperemesis 2/2 marijuana use  -Continue 1:1 sitter    #Polycythemia-suspect secondary to dehydration; resolved  #AKI-suspect secondary to dehydration  #Anion gap metabolic acidosis; resolved  #Hyperkalemia; resolved  -Patient removed own IV, encourage oral hydration  -Trend BMP    #Mild rhabdomyolysis  -Does not require hospitalization  -Patient removed own IV, encourage oral hydration        Expected [place]: Discharge likely in 1-2 days pending clinical course  DVT ppx: HEPARIN and SCD  Interpreter: no, not indicated  Code Status: FULL CODE  Diet: Regular Diet    Please see attending note that follows this mid-level encounter note.   Review of Systems:   Review of Systems - Negative except Nausea/vomiting improved, complains of constipation, did not sleep overnight  Review of Systems   Constitutional: Negative.    Respiratory: Negative.    Cardiovascular: Negative.    Gastrointestinal: Positive for nausea and vomiting.   Genitourinary: Negative.    Musculoskeletal: Negative.    Skin: Negative.    Neurological: Negative.    Psychiatric/Behavioral: The patient is nervous/anxious.      Physical Exam:   Physical Exam  Vitals and nursing note reviewed.   HENT:      Head: Normocephalic and atraumatic.   Cardiovascular:      Rate and Rhythm: Normal rate and regular  rhythm.   Pulmonary:      Effort: Pulmonary effort is normal. No respiratory distress.      Breath sounds: No wheezing.   Abdominal:      General: Bowel sounds are normal. There is no distension.      Palpations: Abdomen is soft.      Tenderness: There is no abdominal tenderness.   Musculoskeletal:      Cervical back: Neck supple.   Neurological:      Mental Status:  He is alert.   Psychiatric:         Attention and Perception: He is inattentive. He perceives auditory hallucinations.         Mood and Affect: Affect normal.         Behavior: Behavior is hyperactive. Behavior is cooperative.         Judgment: Judgment is impulsive.       VITAL SIGNS   Temp:  [98.7 F (37.1 C)-100.3 F (37.9 C)] 100.3 F (37.9 C)  Heart Rate:  [73-100] 73  Resp Rate:  [16-17] 17  BP: (103-153)/(71-97) 153/92  Blood Glucose:      No intake or output data in the 24 hours ending 04/21/20 1754       Meds:     Current Facility-Administered Medications   Medication Dose Route Frequency    capsaicin   Topical TID    gabapentin  100 mg Oral TID    heparin (porcine)  5,000 Units Subcutaneous Q12H [facility]    OLANZapine zydis  5 mg Oral BID    ondansetron  4 mg Oral Q6H    Or    ondansetron  4 mg Intravenous Q6H    polyethylene glycol  17 g Oral Daily     Labs:     Recent Labs     04/21/20  0831 04/20/20  1322   WBC 13.81* 21.41*   Hgb 15.1 16.1   Hematocrit 45.8 49.2   Platelets 249 280   MCV 92.0 92.3     Recent Labs     04/21/20  0831 04/20/20  1322 04/20/20  0200 04/20/20  0200   Sodium 139 146*   < > 149*   Potassium 3.7 4.3   < > 5.7*   Chloride 105 112*   < > 113*   CO2 23 22   < > 18*   BUN 31.0* 42.0*   < > 49.0*   Creatinine 1.5* 2.8*   < > 5.1*   Glucose 105* 106*   < > 99   Calcium 10.0 10.1   < > 10.4   Magnesium 2.7*  --   --  2.9*    < > = values in this interval not displayed.     Recent Labs     04/19/20  1723   AST (SGOT) 46*   ALT 26   Alkaline Phosphatase 86   Protein, Total 10.7*   Albumin 6.5*     No results for input(s): PTT, PT, INR in the last 72 hours.  Imaging personally reviewed.    Safety Checklist:     DVT prophylaxis:  CHEST guideline (See page e199S) Chemical and Mechanical   Foley:  [place] Rn Foley protocol Not present   IVs:  Peripheral IV   PT/OT: Not needed   Daily CBC & or Chem ordered:  SHM/ABIM guidelines (see #5) Yes, due to clinical and lab instability   Reference  for approximate charges of common labs: CBC auto diff - $  76   BMP - $99   Mg - $79  Disposition:   Today's date: 04/21/2020   Admit Date: 04/19/2020  5:07 PM  Anticipated medical stability for discharge:Yellow - maybe tomorrow  Service status: Inpatient non-IMC Status: risk of readmission and new onset of end-organ disease that would require in hospital work up  Reason for ongoing hospitalization: IVF, Psych eval, med adjust   Anticipated discharge needs: Pending psych eval   I have discussed with Attending: Roxana Hires, MD  Signed by: Melany Guernsey, FNP  Adult Observation Unit Nurse Practitioner  [phone] or [phone] on NT6   I have Reviewed the interval history, images and pertinent test results and personally examined the patient and confirmed the major physical findings of the preceding mid-levels note. Agree with above

## 2020-04-21 NOTE — Progress Notes (Signed)
Patient has emesis x3 (bile-like/ green in color) between 0900 and 1230.

## 2020-04-21 NOTE — UM Notes (Signed)
Observation Review 46/71:    29 year old male admitted to [facility] as observation on 8/21 for hyperemesis and AKI.  Patient now medically discharged.    Vitals:  Temp:  [98.5 F (36.9 C)-99.4 F (37.4 C)]   Heart Rate:  [85-100]   Resp Rate:  [15-17]   BP: (103-143)/(71-97)   SpO2:  [99 %-100 %]     UTILIZATION REVIEW CONTACT: Name: Melvenia Beam, RN, CM  Clinical Case Manager  - Utilization Review  [facility]  Address:  [address] [place], [place]  [address]  NPI:   [id number]  Tax ID:  [id number]  Phone: [phone]  Fax: [phone]  Email: [name]     Please use fax number [phone] to provide authorization for hospital services or to request additional information.

## 2020-04-22 ENCOUNTER — Encounter: Payer: Self-pay | Admitting: Internal Medicine

## 2020-04-22 ENCOUNTER — Inpatient Hospital Stay: Payer: Medicaid Managed Care Other

## 2020-04-22 DIAGNOSIS — F1911 Other psychoactive substance abuse, in remission: Secondary | ICD-10-CM

## 2020-04-22 DIAGNOSIS — F1220 Cannabis dependence, uncomplicated: Secondary | ICD-10-CM

## 2020-04-22 DIAGNOSIS — Z5189 Encounter for other specified aftercare: Secondary | ICD-10-CM

## 2020-04-22 DIAGNOSIS — E872 Acidosis: Secondary | ICD-10-CM

## 2020-04-22 DIAGNOSIS — F3110 Bipolar disorder, current episode manic without psychotic features, unspecified: Secondary | ICD-10-CM

## 2020-04-22 DIAGNOSIS — D72829 Elevated white blood cell count, unspecified: Secondary | ICD-10-CM

## 2020-04-22 DIAGNOSIS — Z1389 Encounter for screening for other disorder: Secondary | ICD-10-CM

## 2020-04-22 DIAGNOSIS — E871 Hypo-osmolality and hyponatremia: Secondary | ICD-10-CM

## 2020-04-22 DIAGNOSIS — Z87828 Personal history of other (healed) physical injury and trauma: Secondary | ICD-10-CM

## 2020-04-22 LAB — ECG 12-LEAD
Atrial Rate: 69 {beats}/min
P Axis: 64 degrees
P-R Interval: 128 ms
Q-T Interval: 400 ms
QRS Duration: 120 ms
QTC Calculation (Bezet): 428 ms
R Axis: 90 degrees
T Axis: 72 degrees
Ventricular Rate: 69 {beats}/min

## 2020-04-22 LAB — CBC AND DIFFERENTIAL
Absolute NRBC: 0 10*3/uL (ref 0.00–0.00)
Basophils Absolute Automated: 0.03 10*3/uL (ref 0.00–0.08)
Basophils Automated: 0.3 %
Eosinophils Absolute Automated: 0 10*3/uL (ref 0.00–0.44)
Eosinophils Automated: 0 %
Hematocrit: 48.5 % (ref 37.6–49.6)
Hgb: 16.3 g/dL (ref 12.5–17.1)
Immature Granulocytes Absolute: 0.03 10*3/uL (ref 0.00–0.07)
Immature Granulocytes: 0.3 %
Lymphocytes Absolute Automated: 1.98 10*3/uL (ref 0.42–3.22)
Lymphocytes Automated: 20 %
MCH: 31.5 pg (ref 25.1–33.5)
MCHC: 33.6 g/dL (ref 31.5–35.8)
MCV: 93.8 fL (ref 78.0–96.0)
MPV: 12.1 fL (ref 8.9–12.5)
Monocytes Absolute Automated: 0.75 10*3/uL (ref 0.21–0.85)
Monocytes: 7.6 %
Neutrophils Absolute: 7.09 10*3/uL — ABNORMAL HIGH (ref 1.10–6.33)
Neutrophils: 71.8 %
Nucleated RBC: 0 /100 WBC (ref 0.0–0.0)
Platelets: 221 10*3/uL (ref 142–346)
RBC: 5.17 10*6/uL (ref 4.20–5.90)
RDW: 14 % (ref 11–15)
WBC: 9.88 10*3/uL — ABNORMAL HIGH (ref 3.10–9.50)

## 2020-04-22 LAB — BASIC METABOLIC PANEL
Anion Gap: 15 (ref 5.0–15.0)
BUN: 24 mg/dL (ref 9.0–28.0)
CO2: 23 mEq/L (ref 22–29)
Calcium: 10.4 mg/dL (ref 8.5–10.5)
Chloride: 105 mEq/L (ref 100–111)
Creatinine: 1.5 mg/dL — ABNORMAL HIGH (ref 0.7–1.3)
Glucose: 98 mg/dL (ref 70–100)
Potassium: 4 mEq/L (ref 3.5–5.1)
Sodium: 143 mEq/L (ref 136–145)

## 2020-04-22 LAB — GFR: EGFR: >60

## 2020-04-22 LAB — LIPASE: Lipase: 96 U/L — ABNORMAL HIGH (ref 8–78)

## 2020-04-22 LAB — MAGNESIUM: Magnesium: 2.5 mg/dL (ref 1.6–2.6)

## 2020-04-22 MED ORDER — TRAZODONE HCL 50 MG PO TABS
50.0000 mg | ORAL_TABLET | Freq: Every evening | ORAL | Status: DC
Start: 2020-04-22 — End: 2020-04-23
  Administered 2020-04-22: 22:00:00 50 mg via ORAL
  Filled 2020-04-22: qty 1

## 2020-04-22 MED ORDER — OLANZAPINE 5 MG PO TBDP
5.0000 mg | ORAL_TABLET | Freq: Once | ORAL | Status: AC
Start: 2020-04-22 — End: 2020-04-22
  Administered 2020-04-22: 09:00:00 5 mg via ORAL
  Filled 2020-04-22: qty 1

## 2020-04-22 MED ORDER — SERTRALINE HCL 50 MG PO TABS
50.0000 mg | ORAL_TABLET | Freq: Every day | ORAL | Status: DC
Start: 2020-04-23 — End: 2020-04-23
  Administered 2020-04-23: 11:00:00 50 mg via ORAL
  Filled 2020-04-22: qty 1

## 2020-04-22 MED ORDER — CAPSAICIN 0.025 % EX CREA
TOPICAL_CREAM | Freq: Three times a day (TID) | CUTANEOUS | Status: DC
Start: 2020-04-22 — End: 2020-04-23
  Filled 2020-04-22: qty 60

## 2020-04-22 MED ORDER — SERTRALINE HCL 50 MG PO TABS
25.0000 mg | ORAL_TABLET | Freq: Every day | ORAL | Status: DC
Start: 2020-04-22 — End: 2020-04-22

## 2020-04-22 MED ORDER — PLASMA-LYTE A IV INFUSION
INTRAVENOUS | Status: DC
Start: 2020-04-22 — End: 2020-04-23

## 2020-04-22 MED ORDER — OLANZAPINE 10 MG IM SOLR
2.0000 mg | Freq: Once | INTRAMUSCULAR | Status: DC | PRN
Start: 2020-04-22 — End: 2020-04-22
  Filled 2020-04-22: qty 2

## 2020-04-22 MED ORDER — METOCLOPRAMIDE HCL 5 MG/ML IJ SOLN
5.0000 mg | Freq: Three times a day (TID) | INTRAMUSCULAR | Status: DC
Start: 2020-04-22 — End: 2020-04-23
  Administered 2020-04-22: 17:00:00 5 mg via INTRAVENOUS
  Filled 2020-04-22 (×2): qty 2

## 2020-04-22 MED ORDER — ONDANSETRON HCL 4 MG/2ML IJ SOLN
4.0000 mg | Freq: Three times a day (TID) | INTRAMUSCULAR | Status: DC | PRN
Start: 2020-04-22 — End: 2020-04-22
  Administered 2020-04-22: 05:00:00 4 mg via INTRAVENOUS
  Filled 2020-04-22: qty 2

## 2020-04-22 MED ORDER — PROMETHAZINE HCL 25 MG RE SUPP
25.0000 mg | Freq: Four times a day (QID) | RECTAL | Status: DC | PRN
Start: 2020-04-22 — End: 2020-04-23
  Filled 2020-04-22 (×4): qty 1

## 2020-04-22 MED ORDER — SODIUM CHLORIDE 0.9 % IV SOLN
12.5000 mg | Freq: Four times a day (QID) | INTRAVENOUS | Status: DC | PRN
Start: 2020-04-22 — End: 2020-04-23
  Filled 2020-04-22 (×2): qty 1

## 2020-04-22 MED ORDER — ONDANSETRON HCL 4 MG/2ML IJ SOLN
4.0000 mg | Freq: Four times a day (QID) | INTRAMUSCULAR | Status: DC
Start: 2020-04-22 — End: 2020-04-23
  Administered 2020-04-22 – 2020-04-23 (×3): 4 mg via INTRAVENOUS
  Filled 2020-04-22 (×4): qty 2

## 2020-04-22 MED ORDER — MELATONIN 3 MG PO TABS
9.0000 mg | ORAL_TABLET | Freq: Every evening | ORAL | Status: DC
Start: 2020-04-22 — End: 2020-04-23
  Administered 2020-04-22: 22:00:00 9 mg via ORAL
  Filled 2020-04-22: qty 3

## 2020-04-22 MED ORDER — PROMETHAZINE HCL 25 MG PO TABS
25.0000 mg | ORAL_TABLET | Freq: Four times a day (QID) | ORAL | Status: DC | PRN
Start: 2020-04-22 — End: 2020-04-23

## 2020-04-22 MED ORDER — ONDANSETRON HCL 8 MG PO TABS
4.0000 mg | ORAL_TABLET | Freq: Four times a day (QID) | ORAL | Status: DC
Start: 2020-04-22 — End: 2020-04-23
  Administered 2020-04-23: 4 mg via ORAL
  Filled 2020-04-22 (×2): qty 1

## 2020-04-22 MED ORDER — METOCLOPRAMIDE HCL 10 MG PO TABS
5.0000 mg | ORAL_TABLET | Freq: Three times a day (TID) | ORAL | Status: DC
Start: 2020-04-22 — End: 2020-04-23
  Administered 2020-04-23: 11:00:00 5 mg via ORAL
  Filled 2020-04-22 (×2): qty 1

## 2020-04-22 MED ORDER — SCOPOLAMINE 1 MG/3DAYS TD PT72
1.0000 | MEDICATED_PATCH | TRANSDERMAL | Status: DC
Start: 2020-04-22 — End: 2020-04-23
  Administered 2020-04-22: 09:00:00 1 via TRANSDERMAL
  Filled 2020-04-22: qty 1

## 2020-04-22 NOTE — UM Notes (Signed)
04/22/2020    MEDICAID HMO/MEDSTAR FAMILY CHOICE [place] MEDICAID [facility]    04/21/20 2023  Admit to Inpatient Once            Plan of Care     Addendum   Date of Service:  04/21/2020 12:47 PM   Creation Time:  04/21/2020 12:47 PM     While labs have improved (Cr trending down) and he is able to tolerate liquids, he is still having recurrent emesis putting him at risk for recurrent AKI.      Nausea/vomiting attributed to cannabis hyperemesis syndrome - ongoing     Mild rhabdomyolysis - CK 2000    Pt does not have IV in place, unable to replace and pt likely to remove it. Encourage continued oral hydration.   - Schedule PO zofran q6h w/ oral phenergan prn     If pt tolerates PO today could be medically released. Per psych note, psych to re-eval prior to discharge. Pt may benefit from inpt psychiatric stay for continued med titration, defer to psych    His Zyprexa was further adjusted. Psych recommended IP CATs consult for hyperemesis 2/2 self medication w/marijuana. Also recommended stopping his home sertraline and trazodone since he does not take it regularly. The patient did not tolerate a regular diet, so antiemetics were adjusted and diet changed to CLD.     8/23: multiple episodes of vomiting. CLD started and antiemetics adjusted. CATS consult enter per psych recommendations    #Persistent n/v  -CATs consult entered for hyperemesis 2/2 marijuana use  -Capsaicin cream 3 times daily  -Warm showers improve n/v  -Zofran changed to scheduled and started phenergan PO PRN   -Changed regular diet to CLD  -Marijuana cessation  -Trend CBC, monitor for fever  -[place] pending tolerating diet    Stop home trazodone and Zoloft since patient is not taking regularly outside of hospital  -Change Zyprexa to 5 mg QHS, discontinue at   -Recommended IP CATs for hyperemesis 2/2 marijuana use  -Continue 1:1 sitter    Patient removed own IV, encourage oral hydration    04/22/2020  BP 167/109, Creatanine 1.5, WBC 9.88,     Scheduled     capsaicin (ZOSTRIX) 0.025 % topical cream   No Dose/Rate, TP, TID   Last Action:  Given, No Dose/Rate at 08/24 0907   gabapentin (NEURONTIN) capsule 100 mg   100 mg, PO, TID   Last Action:  Given, 100 mg at 08/23 2102   heparin (porcine) injection 5,000 Units   5,000 Units, SC, Q12H [place]   Last Action:  Given, 5,000 Units at 08/24 0919   melatonin tablet 9 mg   9 mg, PO, QHS   Last Action:  Ordered   OLANZapine zydis (ZyPREXA ZYDIS) disintegrating tablet 5 mg   5 mg, PO, QHS   Last Action:  Ordered   ondansetron (ZOFRAN-ODT) disintegrating tablet 4 mg (Or Linked Group #1)   4 mg, PO, Q6H   Last Action:  Given, 4 mg at 08/23 2148   polyethylene glycol (MIRALAX) packet 17 g   17 g, PO, Daily   Last Action:  Given, 17 g at 08/24 0919   scopolamine (TRANSDERM-SCOP) patch (1.5 mg) 1 mg/72 hrs 1 patch   1 patch, TD, Q72H   Last Action:  Patch Applied, 1 patch at 08/24 0918   Continuous    Plasma-Lyte A IV infusion   75 mL/hr, IV, Continuous   Last Action:  New Bag, 75 mL/hr at 08/24  0459   PRN    acetaminophen (TYLENOL) suppository 650 mg (Or Linked Group #2)   650 mg, RE, Q6H PRN   Last Action:  Ordered   acetaminophen (TYLENOL) tablet 650 mg (Or Linked Group #2)   650 mg, PO, Q6H PRN   Last Action:  Ordered   bisacodyl (DULCOLAX) suppository 10 mg   10 mg, RE, Daily PRN   Last Action:  Ordered   dextrose (GLUCOSE) 40 % oral gel 15 g of glucose (And Linked Group #3)   15 g of glucose, PO, PRN   Last Action:  Ordered   dextrose 50 % bolus 12.5 g (And Linked Group #3)   12.5 g, IV, PRN   Last Action:  Ordered   glucagon (rDNA) (GLUCAGEN) injection 1 mg (And Linked Group #3)   1 mg, IM, PRN   Last Action:  Ordered   naloxone (NARCAN) injection 0.2 mg   0.2 mg, IV, PRN   Last Action:  Ordered   ondansetron (ZOFRAN) injection 4 mg   4 mg, IV, Q8H PRN   Last Action:  Given, 4 mg at 08/24 0522   promethazine (PHENERGAN) tablet 12.5 mg   12.5 mg, PO, Q6H PRN   Last Action:  Given, 12.5 mg at 08/23 1334     Zofran IV Q8 PRN given  x1 in last 24 hours,
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
## 2020-04-22 NOTE — Plan of Care (Signed)
Adult Observation Progress Note    Shift Note: English speaking. A/Ox4. Anxious, restless during shift. Pt calm and cooperative. No tele. Stable on RA. Denies pain. VSS. Neuro intact, moderate strength to all extremities. Full sensation. Pt has impaired vision, impaired hearing to R ear. Lungs clear b/l. Skin intact, tattoos tos BUE and chest. Continent of bowel and bladder. Tenderness to abd. Bowel sounds hypoactive. Pt x4 episodes of emesis during day. Unable to tolerate PO medications. Pt endorsing wanting to sleep, trazodone re-ordered. Pt took 3 showers during day time. Pt stable, in NAD.     BM during shift: No    Pending Orders:  N/V management   IVF   Psyc following  CATS consulted   1:1 Sitter     Discharge Plan: TBD, pending tolerance of PO intake. If pt pulls out PIV and refusing new assess pt will be d/c. If no improvement in n/v considering CT abd/pelvis     Social/Family Visits: n/a    POC update: Pt & mother updated    Vitals:    04/21/20 2327 04/22/20 0305 04/22/20 0754 04/22/20 1049   BP: 150/87 (!) 144/98 (!) 162/119 (!) 167/106   Pulse: 74 74 68 72   Resp: 18 15 16 18    Temp: 100.2 F (37.9 C) 98.6 F (37 C) 97.5 F (36.4 C) 97.9 F (36.6 C)   TempSrc: Oral Oral Oral Oral   SpO2: 100% 100% 99% 99%   Weight:       Height:         Patient Lines/Drains/Airways Status    Active Lines, Drains and Airways     Name:   Placement date:   Placement time:   Site:   Days:    Peripheral IV 04/22/20 20 G Anterior;Left;Proximal Forearm   04/22/20    0400    Forearm   less than 1              Problem: Safety  Goal: Patient will be free from injury during hospitalization  Outcome: Progressing  Flowsheets (Taken 04/21/2020 2330 by Debby Freiberg, RN)  Patient will be free from injury during hospitalization:   Assess patient's risk for falls and implement fall prevention plan of care per policy   Provide and maintain safe environment   Use appropriate transfer methods   Hourly rounding   Include patient/  family/ care giver in decisions related to safety   Assess for patients risk for elopement and implement Elopement Risk Plan per policy   Ensure appropriate safety devices are available at the bedside  Goal: Patient will be free from infection during hospitalization  Outcome: Progressing  Flowsheets (Taken 04/21/2020 2330 by Debby Freiberg, RN)  Free from Infection during hospitalization:   Assess and monitor for signs and symptoms of infection   Monitor lab/diagnostic results   Monitor all insertion sites (i.e. indwelling lines, tubes, urinary catheters, and drains)   Encourage patient and family to use good hand hygiene technique     Problem: Pain  Goal: Pain at adequate level as identified by patient  Outcome: Progressing  Flowsheets (Taken 04/21/2020 2330 by Debby Freiberg, RN)  Pain at adequate level as identified by patient:   Identify patient comfort function goal   Assess pain on admission, during daily assessment and/or before any "as needed" intervention(s)   Evaluate if patient comfort function goal is met   Evaluate patient's satisfaction with pain management progress   Offer non-pharmacological pain management interventions   Include patient/patient care  companion in decisions related to pain management as needed     Problem: Side Effects from Pain Analgesia  Goal: Patient will experience minimal side effects of analgesic therapy  Outcome: Progressing  Flowsheets (Taken 04/21/2020 2330 by Debby Freiberg, RN)  Patient will experience minimal side effects of analgesic therapy:   Monitor/assess patient's respiratory status (RR depth, effort, breath sounds)   Assess for changes in cognitive function   Prevent/manage side effects per LIP orders (i.e. nausea, vomiting, pruritus, constipation, urinary retention, etc.)   Evaluate for opioid-induced sedation with appropriate assessment tool (i.e. POSS)     Problem: Discharge Barriers  Goal: Patient will be discharged home or other facility with appropriate  resources  Outcome: Progressing  Flowsheets (Taken 04/21/2020 2330 by Debby Freiberg, RN)  Discharge to home or other facility with appropriate resources:   Provide appropriate patient education   Provide information on available health resources   Initiate discharge planning     Problem: Psychosocial and Spiritual Needs  Goal: Demonstrates ability to cope with hospitalization/illness  Outcome: Progressing  Flowsheets (Taken 04/21/2020 2330 by Debby Freiberg, RN)  Demonstrates ability to cope with hospitalizations/illness:   Encourage verbalization of feelings/concerns/expectations   Provide quiet environment   Assist patient to identify own strengths and abilities   Encourage patient to set small goals for self   Include patient/ patient care companion in decisions   Encourage participation in diversional activity     Problem: Altered GI Function  Goal: Fluid and electrolyte balance are achieved/maintained  Outcome: Progressing  Flowsheets (Taken 04/21/2020 2330 by Debby Freiberg, RN)  Fluid and electrolyte balance are achieved/maintained:   Monitor intake and output every shift   Monitor/assess lab values and report abnormal values   Provide adequate hydration   Assess for confusion/personality changes   Assess and reassess fluid and electrolyte status   Observe for seizure activity and initiate seizure precautions if indicated  Goal: Elimination patterns are normal or improving  Outcome: Progressing  Flowsheets (Taken 04/21/2020 2330 by Debby Freiberg, RN)  Elimination patterns are normal or improving:   Report abnormal assessment to physician   Assess for normal bowel sounds   Monitor for abdominal distension   Monitor for abdominal discomfort   Assess for signs and symptoms of bleeding.  Report signs of bleeding to physician   Administer treatments as ordered   Encourage /perform oral hygiene as appropriate  Goal: Mobility/Activity is maintained at optimal level for patient  Outcome:  Progressing  Flowsheets (Taken 04/21/2020 2330 by Debby Freiberg, RN)  Mobility/activity is maintained at optimal level for patient:   Increase mobility as tolerated/progressive mobility   Plan activities to conserve energy, plan rest periods   Assess for changes in respiratory status, level of consciousness and/or development of fatigue     Problem: Moderate/High Fall Risk Score >5  Goal: Patient will remain free of falls  Outcome: Progressing  Flowsheets (Taken 04/22/2020 1246)  Moderate Risk (6-13):   LOW-Fall Interventions Appropriate for Low Fall Risk   LOW-Anticoagulation education for injury risk     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 AS EVIDENCED BY:  Goal: Will identify long and short term goals based on individual needs and strengths  Outcome: Progressing  Flowsheets (Taken 04/22/2020 1246)  Pt will identify long and short term treatment goals based on individual needs and strengths:   Assist to identify goals for treatment based on individual needs and strengths   Assist patient to define criteria for discharge  Assist patient to sign acknowledgement of treatment plan participation  Goal: Patient achieves a safe detoxification and management of withdrawal symptoms  Outcome: Progressing  Flowsheets (Taken 04/22/2020 1246)  Patient achieves a safe detoxification and management of withdrawal symptoms:   Assess withdrawal signs/symptoms according to identified protocol (e.g. CIWA/COWS)   Provide medication teaching including name, dosage, benefits, action, effect and side effects   Assess effectiveness (relief of withdrawal symptoms) and side effects of medication   Educate about health risks associated with withdrawal (e.g. seizures, DTs)   Ensure adequate hydration and nutrition during withdrawal   Ensure laboratory results are reviewed with the LIP to increase understanding of the medical consequences of addiction   Educate about the importance of reporting dizziness or  unsteadiness while ambulating to care providers (e.g. RN, LIP)  Goal: Patient educated about the disease of addiction and recovery and identifies long-term goal  Outcome: Progressing  Flowsheets (Taken 04/22/2020 1246)  Patient educated about the disease of addiction and recovery and identifies long-term goal:   Educate about the characteristics of alcohol/substance abuse/dependence   Assist patient to identify coping strategies to use as alternatives to alcohol/substance abuse/dependency   Encourage and monitor participation in all unit activities   Encourage participation in groups and 12 step meetings while on the unit   Provide patient with a task and an opportunity to present during group  Goal: Patient develops a discharge plan  Outcome: Progressing  Flowsheets (Taken 04/22/2020 1246)  Patient develops a discharge plan:   Review healthy diversion activities that promote abstinence from alcohol/substances   Review community resources and social supports that promote sober living   Review sober recovery transitional plan   Provide referral resources and assist in determining appropriate continuing care   Ensure follow up appointments for continued care are made     Problem: Loss of functioning (Thought Disorder, Mood Disturbance and/or Severe Anxiety) AS EVIDENCED BY...  Goal: Will identify long and short term goals based on individual needs and strengths  Outcome: Progressing  Flowsheets (Taken 04/22/2020 1246)  Pt will identify long and short term treatment goals based on individual needs and strengths:   Assist to identify goals for treatment based on individual needs and strengths   Assist patient to define criteria for discharge   Assist patient to sign acknowledgement of treatment plan participation  Goal: Will remain safe during hospitalization  Outcome: Progressing  Flowsheets (Taken 04/22/2020 1246)  Will remain safe during hospitalization:   Assist to complete in-hospital safety plan    Ensure Informed consent for all psychotropic medications   Provide medication teaching including name, dosage, benefits, action, effect and side effects   Encourage to report response to medications including side effects   Verbalizes understanding of medication, benefits, and side effects  Goal: Maintains adequate nutrition/hydration  Outcome: Progressing  Flowsheets (Taken 04/22/2020 1246)  Maintains adequate nutrition/hydration:   Monitor intake and output every shift   Identify need for nutritional consult  Goal: Maintains adequate sleep/rest pattern  Outcome: Progressing  Goal: Performs ADLs Independently  Outcome: Progressing  Flowsheets (Taken 04/22/2020 1246)  Performs ADLs Independently: Monitor ability to perform ADLs every shift  Goal: Completes discharge safety and recovery plan  Outcome: Progressing  Flowsheets (Taken 04/22/2020 1246)  Completes discharge safety and recovery plan:   Initial evaluation of discharge needs   Establish transitional plan to next level of care with follow up appointment date   Involve the patient's family/support person regarding discharge planning process   Complete  suicide risk assessment prior to discharge   Identify community resources to support discharge planning   Address availability of firearms in home environment with family and patient   Facilitate completion of discharge safety plan     Problem: Thought Disorder  Goal: Demonstrates orientation to person place and time  Outcome: Progressing  Flowsheets (Taken 04/22/2020 1246)  Demonstrates orientation to person, place, and time:   Monitor patient's orientation status every shift   Re-orient the patient to environment as needed  Goal: Conducts goal directed, coherent conversation  Outcome: Progressing  Flowsheets (Taken 04/22/2020 1246)  Conducts goal directed, coherent conversation:   Correct errors of perception in a matter of fact manner   Assess patient's conversation every shift   Redirect  conversations in a calm supportive manner  Goal: Verbalizes reduction in hallucinations/delusions  Outcome: Progressing  Flowsheets (Taken 04/22/2020 1246)  Verbalizes reduction in hallucinations/delusions:   Monitor the presence of hallucinations/delusions every shift   Reality test with patient if patient able to tolerate     Problem: Mood Disorder  Goal: Reports improved mood  Outcome: Progressing  Flowsheets (Taken 04/22/2020 1246)  Reports improved mood:   Frequent brief 1:1 conversations with the patient to assess mood /coping response   Encourage the patient to verbalize feelings of anxiety, anger, and fears  Goal: Demonstrates decreased episodes of intrusive behavior / boundary violations  Outcome: Progressing  Flowsheets (Taken 04/22/2020 1246)  Demonstrates decreased episodes of intrusive behavior / boundary violations:   Use simple concrete limit setting and explanations   Reduce environmental stimuli   Assess patient's ability to tolerate group situations  Goal: Verbalizes reduction in hallucinations/delusions  Outcome: Progressing  Flowsheets (Taken 04/22/2020 1246)  Verbalizes reduction in hallucinations/delusions:   Monitor the presence of hallucinations/delusions every shift   Reality test with patient if patient able to tolerate     Problem: Severe Anxiety  Goal: Verbalizes reduced anxiety  Outcome: Progressing  Flowsheets (Taken 04/22/2020 1246)  Verbalizes reduced anxiety:   Assist to identify signs/symptoms of anxiety   Educate about stress reduction techniques and healthy coping responses   Facilitate expression of feelings, fears, concerns, anxiety

## 2020-04-22 NOTE — Plan of Care (Signed)
Adult Observation Progress Note    Shift note: Patient received to shift at 1900. Bedside report received from outgoing RN Alexa.  Introduced self to patient and white board updated.  Sitter at bedside.    General: VSS. A&Ox4. No signs of distress at this time, no c/o pain.  Neuro: Strong strength to upper extremities and lower extremities, anxious at times  Resp: WDL, clear on RA  Cardio: Not on tele  GI: Bowel sounds hypoactive, multiple emesis episodes TNTC  LBM: unknown  GU: WDL  Integumentary: WDL, tattoos BUE & chest  IV: 20G CDI, flushed  Musk: Patient ambulates independently without assistive devices, low fall precautions in place    Plan:  1 to 1 sitter  CATs consult  Monitor labs  Capsaicin cream TID  Encourage oral hydration (ice water)  Clear liquid diet  N/V mgmt    Consult:  CATS consult    Discharge Plan:  Pending medical clearance once   PO tolerated    Social/Family Visits:  N/A    POC update: Patient updated on plan of care, verbalized understanding.       Problem: Safety  Goal: Patient will be free from injury during hospitalization  Outcome: Progressing  Flowsheets (Taken 04/21/2020 2330)  Patient will be free from injury during hospitalization:   Assess patient's risk for falls and implement fall prevention plan of care per policy   Provide and maintain safe environment   Use appropriate transfer methods   Hourly rounding   Include patient/ family/ care giver in decisions related to safety   Assess for patients risk for elopement and implement Elopement Risk Plan per policy   Ensure appropriate safety devices are available at the bedside  Goal: Patient will be free from infection during hospitalization  Outcome: Progressing  Flowsheets (Taken 04/21/2020 2330)  Free from Infection during hospitalization:   Assess and monitor for signs and symptoms of infection   Monitor lab/diagnostic results   Monitor all insertion sites (i.e. indwelling lines, tubes, urinary catheters, and drains)    Encourage patient and family to use good hand hygiene technique     Problem: Pain  Goal: Pain at adequate level as identified by patient  Outcome: Progressing  Flowsheets (Taken 04/21/2020 2330)  Pain at adequate level as identified by patient:   Identify patient comfort function goal   Assess pain on admission, during daily assessment and/or before any "as needed" intervention(s)   Evaluate if patient comfort function goal is met   Evaluate patient's satisfaction with pain management progress   Offer non-pharmacological pain management interventions   Include patient/patient care companion in decisions related to pain management as needed     Problem: Side Effects from Pain Analgesia  Goal: Patient will experience minimal side effects of analgesic therapy  Outcome: Progressing  Flowsheets (Taken 04/21/2020 2330)  Patient will experience minimal side effects of analgesic therapy:   Monitor/assess patient's respiratory status (RR depth, effort, breath sounds)   Assess for changes in cognitive function   Prevent/manage side effects per LIP orders (i.e. nausea, vomiting, pruritus, constipation, urinary retention, etc.)   Evaluate for opioid-induced sedation with appropriate assessment tool (i.e. POSS)     Problem: Discharge Barriers  Goal: Patient will be discharged home or other facility with appropriate resources  Outcome: Progressing  Flowsheets (Taken 04/21/2020 2330)  Discharge to home or other facility with appropriate resources:   Provide appropriate patient education   Provide information on available health resources   Initiate discharge planning  Problem: Psychosocial and Spiritual Needs  Goal: Demonstrates ability to cope with hospitalization/illness  Outcome: Progressing  Flowsheets (Taken 04/21/2020 2330)  Demonstrates ability to cope with hospitalizations/illness:   Encourage verbalization of feelings/concerns/expectations   Provide quiet environment   Assist patient to identify own  strengths and abilities   Encourage patient to set small goals for self   Include patient/ patient care companion in decisions   Encourage participation in diversional activity     Problem: Altered GI Function  Goal: Fluid and electrolyte balance are achieved/maintained  Outcome: Progressing  Flowsheets (Taken 04/21/2020 2330)  Fluid and electrolyte balance are achieved/maintained:   Monitor intake and output every shift   Monitor/assess lab values and report abnormal values   Provide adequate hydration   Assess for confusion/personality changes   Assess and reassess fluid and electrolyte status   Observe for seizure activity and initiate seizure precautions if indicated  Goal: Elimination patterns are normal or improving  Outcome: Progressing  Flowsheets (Taken 04/21/2020 2330)  Elimination patterns are normal or improving:   Report abnormal assessment to physician   Assess for normal bowel sounds   Monitor for abdominal distension   Monitor for abdominal discomfort   Assess for signs and symptoms of bleeding.  Report signs of bleeding to physician   Administer treatments as ordered   Encourage /perform oral hygiene as appropriate  Goal: Mobility/Activity is maintained at optimal level for patient  Outcome: Progressing  Flowsheets (Taken 04/21/2020 2330)  Mobility/activity is maintained at optimal level for patient:   Increase mobility as tolerated/progressive mobility   Plan activities to conserve energy, plan rest periods   Assess for changes in respiratory status, level of consciousness and/or development of fatigue     Problem: Moderate/High Fall Risk Score >5  Goal: Patient will remain free of falls  Outcome: Progressing  Flowsheets (Taken 04/21/2020 2330)  Moderate Risk (6-13):   LOW-Fall Interventions Appropriate for Low Fall Risk   LOW-Anticoagulation education for injury risk  VH High Risk (Greater than 13): A safety companion may be used when deemed appropriate by the Primary RN and  Clinical Administrator

## 2020-04-22 NOTE — Progress Notes (Signed)
MEDICINE PROGRESS NOTE    Date Time: 04/22/20 4:40 PM  Patient Name: [name]  Attending Physician: Jerral Ralph, [place]    CC: Upset about having to quit marijuana use    Assessment:   Hospital Course  Joel Paar. is a 29 y.o. male with a PMHx of ?Bipolar Disorder, TBI, ETOH Abuse-in remission, Daily Marijuana use, admitted 04/19/2020 to observation for intractable N/V, recently d/c'd from [facility] ER.  Admission, patient was found to have significant leukocytosis with WBC 27, hemoconcentrated with H/H 18.6/54.3, AKI with creatinine 6.5, hyponatremia NA 149, hyperkalemia K+ 5.2, anion gap metabolic acidosis.  UDS positive for cannabinoid.  Patient noted to have blood in UA.  CK checked showed mild rhabdomyolysis with CK 2000.  Patient was started on aggressive IV fluids.  Patient was noted to be agitated taking frequent showers and impulsive/manic behavior.  Psychiatry was consulted.  He was started on scheduled Zyprexa and sitter was ordered. His Zyprexa was further adjusted. Psych recommended IP CATs consult for hyperemesis 2/2 self medication w/marijuana. Also recommended stopping his home sertraline and trazodone since he does not take it regularly. The patient did not tolerate a regular diet, so antiemetics were adjusted and diet changed to CLD.     Interval History:  8/22: AKI and leukocytosis improving.  Continues aggressive IV fluids.  Psychiatry consulted.  8/23: multiple episodes of vomiting. CLD started and antiemetics adjusted. CATS consult enter per psych recommendations  8/24:  Persistent emesis; check AXR.  Anti-emetics adjusted.  Psych has adjusted meds.    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 Hx Bioplar Disorder  #History of TBI  #Substance abuse  - Appreciate psychiatry recommendations; recommends one-to-one sitter  - Resume zoloft as taking at home per mom; start 50 mg and increase to 100mg  in 2-3 days  - Resume Trazodone QHS  - Zyprexa to 5 mg QHS, discontinue at [place]  - Continue 1:1 sitter    #Polycythemia-suspect secondary to dehydration; resolved  #AKI-suspect secondary to dehydration  #Anion gap metabolic acidosis; resolved  #Hyperkalemia; resolved  -IVF; can stop when tolerating PO  -Trend BMP    #Mild rhabdomyolysis  -Does not require hospitalization    Expected [place]: Discharge likely in 1-2 days pending clinical course  DVT ppx: HEPARIN and SCD  Interpreter: no, not indicated  Code Status: FULL CODE  Diet: Regular Diet    Please see attending note that follows this mid-level encounter note.   Review of Systems:   Review of Systems - Negative except Nausea/vomiting improved, complains of constipation, did not sleep overnight  Review of Systems   Constitutional: Negative.    Respiratory: Negative.    Cardiovascular: Negative.    Gastrointestinal: Positive for nausea and vomiting.   Genitourinary: Negative.    Musculoskeletal: Negative.    Skin: Negative.    Neurological: Negative.      Physical Exam:   Physical Exam  Vitals and nursing note reviewed.   HENT:      Head: Normocephalic and atraumatic.   Cardiovascular:      Rate and Rhythm: Normal rate and  regular rhythm.   Pulmonary:      Effort: Pulmonary effort is normal. No respiratory distress.      Breath sounds: No wheezing.   Abdominal:      General: Bowel sounds are normal. There is no distension.      Palpations: Abdomen is soft.      Tenderness: There is no abdominal tenderness.   Musculoskeletal:      Cervical back: Neck supple.   Neurological:      Mental Status: He is alert and oriented to person, place, and time. Mental status is at baseline.   Psychiatric:          Attention and Perception: He perceives auditory hallucinations.         Mood and Affect: Affect normal.         Behavior: Behavior is cooperative.         Judgment: Judgment is impulsive.       VITAL SIGNS   Temp:  [97.5 F (36.4 C)-100.3 F (37.9 C)] 97.9 F (36.6 C)  Heart Rate:  [68-74] 72  Resp Rate:  [15-18] 18  BP: (144-167)/(87-119) 167/106  Blood Glucose:        Intake/Output Summary (Last 24 hours) at 04/22/2020 1640  Last data filed at 04/22/2020 1200  Gross per 24 hour   Intake 500 ml   Output --   Net 500 ml          Meds:     Current Facility-Administered Medications   Medication Dose Route Frequency    capsaicin   Topical TID    gabapentin  100 mg Oral TID    heparin (porcine)  5,000 Units Subcutaneous Q12H SCH    melatonin  9 mg Oral QHS    metoclopramide  5 mg Oral TID AC    Or    metoclopramide  5 mg Intravenous TID AC    OLANZapine zydis  5 mg Oral QHS    ondansetron  4 mg Oral Q6H    Or    ondansetron  4 mg Intravenous Q6H    polyethylene glycol  17 g Oral Daily    scopolamine  1 patch Transdermal Q72H    [START ON 04/23/2020] sertraline  50 mg Oral Daily    traZODone  50 mg Oral QHS     Labs:     Recent Labs     04/22/20  0300 04/21/20  0831   WBC 9.88* 13.81*   Hgb 16.3 15.1   Hematocrit 48.5 45.8   Platelets 221 249   MCV 93.8 92.0     Recent Labs     04/22/20  0300 04/21/20  0831   Sodium 143 139   Potassium 4.0 3.7   Chloride 105 105   CO2 23 23   BUN 24.0 31.0*   Creatinine 1.5* 1.5*   Glucose 98 105*   Calcium 10.4 10.0   Magnesium 2.5 2.7*     Recent Labs     04/19/20  1723   AST (SGOT) 46*   ALT 26   Alkaline Phosphatase 86   Protein, Total 10.7*   Albumin 6.5*     No results for input(s): PTT, PT, INR in the last 72 hours.  Imaging personally reviewed.    Safety Checklist:     DVT prophylaxis:  CHEST guideline (See page [id number]) Chemical and Mechanical   Foley:  [place] Rn Foley protocol Not present   IVs:  Peripheral IV   PT/OT: Not needed  Daily CBC & or Chem  ordered:  SHM/ABIM guidelines (see #5) Yes, due to clinical and lab instability   Reference for approximate charges of common labs: CBC auto diff - $76   BMP - $99   Mg - $79  Disposition:   Today's date: 04/22/2020   Admit Date: 04/19/2020  5:07 PM  Anticipated medical stability for discharge:Yellow - maybe tomorrow  Service status: Inpatient non-IMC Status: risk of readmission and new onset of end-organ disease that would require in hospital work up  Reason for ongoing hospitalization: IVF, Psych eval, med adjust   Anticipated discharge needs: Pending psych eval   I have discussed with Attending: Roxana Hires, [place]  Signed by: Janyth Pupa, FNP  Adult Observation Unit Nurse Practitioner  [phone] or [phone] on NT6   I have Reviewed the interval history, images and pertinent test results and personally examined the patient and confirmed the major physical findings of the preceding mid-levels note. Agree with above

## 2020-04-22 NOTE — Plan of Care (Signed)
Problem: Safety  Goal: Patient will be free from injury during hospitalization  Outcome: Progressing  Flowsheets (Taken 04/21/2020 2330 by Debby Freiberg, RN)  Patient will be free from injury during hospitalization:   Assess patient's risk for falls and implement fall prevention plan of care per policy   Provide and maintain safe environment   Use appropriate transfer methods   Hourly rounding   Include patient/ family/ care giver in decisions related to safety   Assess for patients risk for elopement and implement Elopement Risk Plan per policy   Ensure appropriate safety devices are available at the bedside  Goal: Patient will be free from infection during hospitalization  Outcome: Progressing  Flowsheets (Taken 04/21/2020 2330 by Debby Freiberg, RN)  Free from Infection during hospitalization:   Assess and monitor for signs and symptoms of infection   Monitor lab/diagnostic results   Monitor all insertion sites (i.e. indwelling lines, tubes, urinary catheters, and drains)   Encourage patient and family to use good hand hygiene technique     Problem: Pain  Goal: Pain at adequate level as identified by patient  Outcome: Progressing  Flowsheets (Taken 04/21/2020 2330 by Debby Freiberg, RN)  Pain at adequate level as identified by patient:   Identify patient comfort function goal   Assess pain on admission, during daily assessment and/or before any "as needed" intervention(s)   Evaluate if patient comfort function goal is met   Evaluate patient's satisfaction with pain management progress   Offer non-pharmacological pain management interventions   Include patient/patient care companion in decisions related to pain management as needed     Problem: Side Effects from Pain Analgesia  Goal: Patient will experience minimal side effects of analgesic therapy  Outcome: Progressing  Flowsheets (Taken 04/21/2020 2330 by Debby Freiberg, RN)  Patient will experience minimal side effects of analgesic therapy:   Monitor/assess patient's  respiratory status (RR depth, effort, breath sounds)   Assess for changes in cognitive function   Prevent/manage side effects per LIP orders (i.e. nausea, vomiting, pruritus, constipation, urinary retention, etc.)   Evaluate for opioid-induced sedation with appropriate assessment tool (i.e. POSS)     Problem: Discharge Barriers  Goal: Patient will be discharged home or other facility with appropriate resources  Outcome: Progressing  Flowsheets (Taken 04/21/2020 2330 by Debby Freiberg, RN)  Discharge to home or other facility with appropriate resources:   Provide appropriate patient education   Provide information on available health resources   Initiate discharge planning     Problem: Psychosocial and Spiritual Needs  Goal: Demonstrates ability to cope with hospitalization/illness  Outcome: Progressing  Flowsheets (Taken 04/21/2020 2330 by Debby Freiberg, RN)  Demonstrates ability to cope with hospitalizations/illness:   Encourage verbalization of feelings/concerns/expectations   Provide quiet environment   Assist patient to identify own strengths and abilities   Encourage patient to set small goals for self   Include patient/ patient care companion in decisions   Encourage participation in diversional activity     Problem: Altered GI Function  Goal: Fluid and electrolyte balance are achieved/maintained  Outcome: Progressing  Flowsheets (Taken 04/21/2020 2330 by Debby Freiberg, RN)  Fluid and electrolyte balance are achieved/maintained:   Monitor intake and output every shift   Monitor/assess lab values and report abnormal values   Provide adequate hydration   Assess for confusion/personality changes   Assess and reassess fluid and electrolyte status   Observe for seizure activity and initiate seizure precautions if indicated  Goal: Elimination patterns are normal or improving  Outcome: Progressing  Flowsheets (Taken 04/21/2020 2330 by Debby Freiberg, RN)  Elimination patterns are normal or improving:   Report abnormal assessment  to physician   Assess for normal bowel sounds   Monitor for abdominal distension   Monitor for abdominal discomfort   Assess for signs and symptoms of bleeding.  Report signs of bleeding to physician   Administer treatments as ordered   Encourage /perform oral hygiene as appropriate  Goal: Mobility/Activity is maintained at optimal level for patient  Outcome: Progressing  Flowsheets (Taken 04/21/2020 2330 by Debby Freiberg, RN)  Mobility/activity is maintained at optimal level for patient:   Increase mobility as tolerated/progressive mobility   Plan activities to conserve energy, plan rest periods   Assess for changes in respiratory status, level of consciousness and/or development of fatigue     Problem: Moderate/High Fall Risk Score >5  Goal: Patient will remain free of falls  Outcome: Progressing  Flowsheets (Taken 04/22/2020 1246 by Hillard Danker, RN)  Moderate Risk (6-13):   LOW-Fall Interventions Appropriate for Low Fall Risk   LOW-Anticoagulation education for injury risk     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 AS EVIDENCED BY:  Goal: Will identify long and short term goals based on individual needs and strengths  Outcome: Progressing  Flowsheets (Taken 04/22/2020 1246 by Hillard Danker, RN)  Pt will identify long and short term treatment goals based on individual needs and strengths:   Assist to identify goals for treatment based on individual needs and strengths   Assist patient to define criteria for discharge   Assist patient to sign acknowledgement of treatment plan participation  Goal: Patient achieves a safe detoxification and management of withdrawal symptoms  Outcome: Progressing  Flowsheets (Taken 04/22/2020 1246 by Hillard Danker, RN)  Patient achieves a safe detoxification and management of withdrawal symptoms:   Assess withdrawal signs/symptoms according to identified protocol (e.g. CIWA/COWS)   Provide medication teaching including name, dosage, benefits, action,  effect and side effects   Assess effectiveness (relief of withdrawal symptoms) and side effects of medication   Educate about health risks associated with withdrawal (e.g. seizures, DTs)   Ensure adequate hydration and nutrition during withdrawal   Ensure laboratory results are reviewed with the LIP to increase understanding of the medical consequences of addiction   Educate about the importance of reporting dizziness or unsteadiness while ambulating to care providers (e.g. RN, LIP)  Goal: Patient educated about the disease of addiction and recovery and identifies long-term goal  Outcome: Progressing  Flowsheets (Taken 04/22/2020 1246 by Hillard Danker, RN)  Patient educated about the disease of addiction and recovery and identifies long-term goal:   Educate about the characteristics of alcohol/substance abuse/dependence   Assist patient to identify coping strategies to use as alternatives to alcohol/substance abuse/dependency   Encourage and monitor participation in all unit activities   Encourage participation in groups and 12 step meetings while on the unit   Provide patient with a task and an opportunity to present during group  Goal: Patient develops a discharge plan  Outcome: Progressing  Flowsheets (Taken 04/22/2020 1246 by Hillard Danker, RN)  Patient develops a discharge plan:   Review healthy diversion activities that promote abstinence from alcohol/substances   Review community resources and social supports that promote sober living   Review sober recovery transitional plan   Provide referral resources and assist in determining appropriate continuing care   Ensure follow up appointments for continued care are made     Problem:  Loss of functioning (Thought Disorder, Mood Disturbance and/or Severe Anxiety) AS EVIDENCED BY...  Goal: Will identify long and short term goals based on individual needs and strengths  Outcome: Progressing  Flowsheets (Taken 04/22/2020 1246 by Hillard Danker, RN)  Pt will  identify long and short term treatment goals based on individual needs and strengths:   Assist to identify goals for treatment based on individual needs and strengths   Assist patient to define criteria for discharge   Assist patient to sign acknowledgement of treatment plan participation  Goal: Will remain safe during hospitalization  Outcome: Progressing  Flowsheets (Taken 04/22/2020 1246 by Hillard Danker, RN)  Will remain safe during hospitalization:   Assist to complete in-hospital safety plan   Ensure Informed consent for all psychotropic medications   Provide medication teaching including name, dosage, benefits, action, effect and side effects   Encourage to report response to medications including side effects   Verbalizes understanding of medication, benefits, and side effects  Goal: Maintains adequate nutrition/hydration  Outcome: Progressing  Flowsheets (Taken 04/22/2020 1246 by Hillard Danker, RN)  Maintains adequate nutrition/hydration:   Monitor intake and output every shift   Identify need for nutritional consult  Goal: Maintains adequate sleep/rest pattern  Outcome: Progressing  Goal: Performs ADLs Independently  Outcome: Progressing  Flowsheets (Taken 04/22/2020 1246 by Hillard Danker, RN)  Performs ADLs Independently: Monitor ability to perform ADLs every shift  Goal: Completes discharge safety and recovery plan  Outcome: Progressing  Flowsheets (Taken 04/22/2020 1246 by Hillard Danker, RN)  Completes discharge safety and recovery plan:   Initial evaluation of discharge needs   Establish transitional plan to next level of care with follow up appointment date   Involve the patient's family/support person regarding discharge planning process   Complete suicide risk assessment prior to discharge   Identify community resources to support discharge planning   Address availability of firearms in home environment with family and patient   Facilitate completion of discharge safety plan      Problem: Thought Disorder  Goal: Demonstrates orientation to person place and time  Outcome: Progressing  Flowsheets (Taken 04/22/2020 1246 by Hillard Danker, RN)  Demonstrates orientation to person, place, and time:   Monitor patient's orientation status every shift   Re-orient the patient to environment as needed  Goal: Conducts goal directed, coherent conversation  Outcome: Progressing  Flowsheets (Taken 04/22/2020 1246 by Hillard Danker, RN)  Conducts goal directed, coherent conversation:   Correct errors of perception in a matter of fact manner   Assess patient's conversation every shift   Redirect conversations in a calm supportive manner  Goal: Verbalizes reduction in hallucinations/delusions  Outcome: Progressing  Flowsheets (Taken 04/22/2020 1246 by Hillard Danker, RN)  Verbalizes reduction in hallucinations/delusions:   Monitor the presence of hallucinations/delusions every shift   Reality test with patient if patient able to tolerate     Problem: Mood Disorder  Goal: Reports improved mood  Outcome: Progressing  Flowsheets (Taken 04/22/2020 1246 by Hillard Danker, RN)  Reports improved mood:   Frequent brief 1:1 conversations with the patient to assess mood /coping response   Encourage the patient to verbalize feelings of anxiety, anger, and fears  Goal: Demonstrates decreased episodes of intrusive behavior / boundary violations  Outcome: Progressing  Flowsheets (Taken 04/22/2020 1246 by Hillard Danker, RN)  Demonstrates decreased episodes of intrusive behavior / boundary violations:   Use simple concrete limit setting and explanations   Reduce environmental stimuli   Assess patient's ability to tolerate group  situations  Goal: Verbalizes reduction in hallucinations/delusions  Outcome: Progressing  Flowsheets (Taken 04/22/2020 1246 by Hillard Danker, RN)  Verbalizes reduction in hallucinations/delusions:   Monitor the presence of hallucinations/delusions every shift   Reality test with  patient if patient able to tolerate     Problem: Severe Anxiety  Goal: Verbalizes reduced anxiety  Outcome: Progressing  Flowsheets (Taken 04/22/2020 1246 by Hillard Danker, RN)  Verbalizes reduced anxiety:   Assist to identify signs/symptoms of anxiety   Educate about stress reduction techniques and healthy coping responses   Facilitate expression of feelings, fears, concerns, anxiety

## 2020-04-22 NOTE — Progress Notes (Signed)
Pt given melatonin for sleep, unable to tolerate PO, emesis episode witnessed. Pt had TNTC episodes of emesis and requested IV medications and fluids. IV access obtained (20G LFA), paged NP Hyacinth Meeker for IV meds & fluids. IM zyprexa & plasmalyte 75 mL/hr ordered. Fluids currently running, IV zofran administered, pt states relief, will hold IM zyprexa for now.

## 2020-04-23 LAB — CBC AND DIFFERENTIAL
Absolute NRBC: 0 10*3/uL (ref 0.00–0.00)
Basophils Absolute Automated: 0.03 10*3/uL (ref 0.00–0.08)
Basophils Automated: 0.4 %
Eosinophils Absolute Automated: 0.01 10*3/uL (ref 0.00–0.44)
Eosinophils Automated: 0.1 %
Hematocrit: 43.3 % (ref 37.6–49.6)
Hgb: 14.9 g/dL (ref 12.5–17.1)
Immature Granulocytes Absolute: 0.03 10*3/uL (ref 0.00–0.07)
Immature Granulocytes: 0.4 %
Lymphocytes Absolute Automated: 1.74 10*3/uL (ref 0.42–3.22)
Lymphocytes Automated: 21.7 %
MCH: 30.8 pg (ref 25.1–33.5)
MCHC: 34.4 g/dL (ref 31.5–35.8)
MCV: 89.5 fL (ref 78.0–96.0)
MPV: 11.5 fL (ref 8.9–12.5)
Monocytes Absolute Automated: 0.65 10*3/uL (ref 0.21–0.85)
Monocytes: 8.1 %
Neutrophils Absolute: 5.56 10*3/uL (ref 1.10–6.33)
Neutrophils: 69.3 %
Nucleated RBC: 0 /100 WBC (ref 0.0–0.0)
Platelets: 221 10*3/uL (ref 142–346)
RBC: 4.84 10*6/uL (ref 4.20–5.90)
RDW: 13 % (ref 11–15)
WBC: 8.02 10*3/uL (ref 3.10–9.50)

## 2020-04-23 LAB — BASIC METABOLIC PANEL
Anion Gap: 12 (ref 5.0–15.0)
BUN: 22 mg/dL (ref 9.0–28.0)
CO2: 24 mEq/L (ref 22–29)
Calcium: 9.5 mg/dL (ref 8.5–10.5)
Chloride: 100 mEq/L (ref 100–111)
Creatinine: 1.2 mg/dL (ref 0.7–1.3)
Glucose: 87 mg/dL (ref 70–100)
Potassium: 4 mEq/L (ref 3.5–5.1)
Sodium: 136 mEq/L (ref 136–145)

## 2020-04-23 LAB — MAGNESIUM: Magnesium: 2.6 mg/dL (ref 1.6–2.6)

## 2020-04-23 LAB — GFR: EGFR: >60

## 2020-04-23 MED ORDER — SCOPOLAMINE 1 MG/3DAYS TD PT72
1.0000 | MEDICATED_PATCH | TRANSDERMAL | 0 refills | Status: DC
Start: 2020-04-25 — End: 2020-06-14

## 2020-04-23 MED ORDER — PROMETHAZINE HCL 25 MG PO TABS
25.0000 mg | ORAL_TABLET | Freq: Three times a day (TID) | ORAL | 0 refills | Status: DC | PRN
Start: 2020-04-23 — End: 2020-04-23

## 2020-04-23 MED ORDER — SERTRALINE HCL 50 MG PO TABS
25.0000 mg | ORAL_TABLET | Freq: Every day | ORAL | Status: DC
Start: 2020-04-24 — End: 2020-04-23

## 2020-04-23 MED ORDER — METOCLOPRAMIDE HCL 5 MG PO TABS
5.0000 mg | ORAL_TABLET | Freq: Three times a day (TID) | ORAL | 0 refills | Status: DC
Start: 2020-04-23 — End: 2020-04-23

## 2020-04-23 MED ORDER — PROMETHAZINE HCL 25 MG PO TABS
25.0000 mg | ORAL_TABLET | Freq: Three times a day (TID) | ORAL | 0 refills | Status: DC | PRN
Start: 2020-04-23 — End: 2020-06-14

## 2020-04-23 MED ORDER — METOCLOPRAMIDE HCL 5 MG PO TABS
5.0000 mg | ORAL_TABLET | Freq: Three times a day (TID) | ORAL | 0 refills | Status: DC
Start: 2020-04-23 — End: 2020-06-14

## 2020-04-23 MED ORDER — ONDANSETRON HCL 4 MG PO TABS
4.0000 mg | ORAL_TABLET | Freq: Four times a day (QID) | ORAL | 0 refills | Status: DC | PRN
Start: 2020-04-23 — End: 2020-06-14

## 2020-04-23 MED ORDER — CAPSAICIN 0.025 % EX CREA
TOPICAL_CREAM | Freq: Three times a day (TID) | CUTANEOUS | 0 refills | Status: DC
Start: 2020-04-23 — End: 2020-04-23

## 2020-04-23 MED ORDER — POLYETHYLENE GLYCOL 3350 17 G PO PACK
17.0000 g | PACK | Freq: Every day | ORAL | Status: DC | PRN
Start: 2020-04-23 — End: 2020-06-14

## 2020-04-23 MED ORDER — SCOPOLAMINE 1 MG/3DAYS TD PT72
1.0000 | MEDICATED_PATCH | TRANSDERMAL | 0 refills | Status: DC
Start: 2020-04-25 — End: 2020-04-23

## 2020-04-23 MED ORDER — CAPSAICIN 0.025 % EX CREA
TOPICAL_CREAM | Freq: Three times a day (TID) | CUTANEOUS | 0 refills | Status: DC
Start: 2020-04-23 — End: 2021-04-26

## 2020-04-23 MED ORDER — MELATONIN 3 MG PO TABS
9.0000 mg | ORAL_TABLET | Freq: Every evening | ORAL | 0 refills | Status: DC | PRN
Start: 2020-04-23 — End: 2020-06-14

## 2020-04-23 MED ORDER — ONDANSETRON HCL 4 MG PO TABS
4.0000 mg | ORAL_TABLET | Freq: Four times a day (QID) | ORAL | 0 refills | Status: DC | PRN
Start: 2020-04-23 — End: 2020-04-23

## 2020-04-23 NOTE — Progress Notes (Signed)
Called patient's mother Joel [name] Better to inform her to pick up son's scripts from [facility] in [facility] in [place]. She agreed.

## 2020-04-23 NOTE — Discharge Summary (Signed)
MEDICINE PROGRESS NOTE/DISCHARGE SUMMARY    Date Time: 04/23/20 3:04 PM  Patient Name: [facility] [name]  Attending Physician: Jerral Ralph, MD  Primary Care Physician: Marisa Sprinkles, MD    Date of Admission: 04/19/2020  Date of Discharge: 04/23/20    Discharge Dx:   # Intractable Nausea, vomiting likely 2/2 hyperemesis cannabinoid syndrome  # Substance induced psychosis   # AKI-resolved   # Hypernakalemia, hypercalcemia, hypernatremia-resolved   # Leukocytosis likely reactive   # Hx of alcohol abuse   # Marijuana use   # Bipolar disorder   # Hx TBI     Disposition:  home     Discharge MEDICATIONS        Medication List      START taking these medications    capsaicin 0.025 % cream  Commonly known as: ZOSTRIX  Apply topically 3 (three) times daily     melatonin 3 mg tablet  Take 3 tablets (9 mg total) by mouth nightly as needed for Sleep     metoclopramide 5 MG tablet  Commonly known as: REGLAN  Take 1 tablet (5 mg total) by mouth 3 (three) times daily before meals     ondansetron 4 MG tablet  Commonly known as: ZOFRAN  Take 1 tablet (4 mg total) by mouth every 6 (six) hours as needed for Nausea     polyethylene glycol 17 g packet  Commonly known as: MIRALAX  Take 17 g by mouth daily as needed (constipation)     scopolamine  Commonly known as: TRANSDERM-SCOP  Place 1 patch onto the skin every third day  Start taking on: April 25, 2020        CHANGE how you take these medications    promethazine 25 MG tablet  Commonly known as: PHENERGAN  Take 1 tablet (25 mg total) by mouth every 8 (eight) hours as needed for Nausea  What changed:    medication strength   how much to take        CONTINUE taking these medications    acetaminophen 325 MG tablet  Commonly known as: TYLENOL  Take 2 tablets (650 mg total) by mouth every 4 (four) hours as needed for Pain     gabapentin 100 MG capsule  Commonly known as: Neurontin  Take 1 capsule (100 mg total) by mouth 3 (three) times daily     sertraline 25 MG tablet  Commonly  known as: ZOLOFT     traZODone 50 MG tablet  Commonly known as: DESYREL  Take 1 tablet (50 mg total) by mouth nightly     vitamin D (ergocalciferol) 50000 UNIT Caps  Commonly known as: DRISDOL  Take 1 capsule (50,000 Units total) by mouth once a week           Where to Get Your Medications      These medications [facility] to [id number] - [place], [place] - [phone] [facility] [address] AT [facility]  [address] [place] [address]    Phone: [phone]    capsaicin 0.025 % cream   metoclopramide 5 MG tablet   ondansetron 4 MG tablet   promethazine 25 MG tablet   scopolamine     Information about where to get these medications is not yet available    Ask your nurse or doctor about these medications   melatonin 3 mg tablet   polyethylene glycol 17 g packet  Consultations:   Treatment Team: Attending Provider: Jerral Ralph, MD; Scribe: Kateri Mc; Nurse Practitioner: Janyth Pupa, FNP; Registered Nurse: Carnella Guadalajara, RN; Registered Nurse: Clois Comber, RN; Registered Nurse: Rosina Lowenstein, RN; Case Manager: [place], Terez, RN; Dietitian: Pincus Sanes, RD   Recent Labs:   Recent Labs   Lab 04/23/20  0400   WBC 8.02   Hgb 14.9   Hematocrit 43.3   Platelets 221     Recent Labs   Lab 04/23/20  0400   Sodium 136   Potassium 4.0   Chloride 100   CO2 24   BUN 22.0   Creatinine 1.2   EGFR >60.0   Glucose 87   Calcium 9.5       Recent Labs   Lab 04/20/20  0200   TSH 0.57      No results found for: T4. Recent Labs   Lab 04/20/20  0200   Creatine Kinase (CK) 2,182*                   Microbiology Results (last 15 days)     ** No results found for the last 360 hours. **        Procedures/Radiology performed:   Radiology: all results from this admission  XR Chest AP Portable    Result Date: 04/19/2020  1. No acute cardiopulmonary process. Clide Cliff, MD  04/19/2020 10:07 PM    XR Abdomen Portable    Result Date: 04/22/2020   Normal bowel gas pattern. Collene Schlichter, MD  04/22/2020 5:39 PM    ECHOCARDIOGRAM   Echo Results     None        Hospital Course:   Reason for admission/ HPI: intractable nausea, vomiting.     Hospital Course: Gatsby Chismar. is a 29 y.o. male with a PMHx of ?Bipolar Disorder, TBI, ETOH Abuse-in remission, Daily Marijuana use, admitted 04/19/2020 to observation for intractable N/V, recently d/c'd from [facility] ER.  Admission, patient was found to have significant leukocytosis with WBC 27, hemoconcentrated with H/H 18.6/54.3, AKI with creatinine 6.5, hyponatremia NA 149, hyperkalemia K+ 5.2, anion gap metabolic acidosis.  UDS positive for cannabinoid.  Patient noted to have blood in UA. CK checked showed mild rhabdomyolysis with CK 2000.  Patient was started on aggressive IV fluids.  Patient was noted to be agitated taking frequent showers and impulsive/manic behavior.  Psychiatry was consulted.  He was started on scheduled Zyprexa and sitter was ordered. His Zyprexa was further adjusted, recommend continuing inpatient and discontinuing at discharge per psychiatry team.     Psych recommended IP CATs consult for hyperemesis 2/2 self medication w/marijuana. Also recommended stopping his home sertraline and trazodone since he does not take it regularly, after discussing with patient's mother who monitors his RX medications per patient, patient does take sertraline and trazodone regularly, medications resumed at home dose (sertraline is 25 mg per mother, trazodone 50 mg Qhs). The patient did not tolerate a regular diet, so antiemetics were adjusted and diet changed to CLD. abdominal XR done on 8/24 showed normal bowel gast pattern, no signs of obstruction.     He was discharged with Reglan 5 mg TID with meals, capsaicin cream TID to abdomen, scopolamine patch, and alternating prn Zofran and phenergan for nausea. Patient requested medications be filled in hospital pharmacy prior to discharge, ended up leaving without picking up RX (one RX delayed) per  RN. New RXs for scopolamine, Zofran, phenergan, Reglan, capsaicin [facility]      Should f/u with PCP in 3-5 days. Currently the patient is hemodynamically stable for discharge with outpatient follow up as outlined below.    Discharge Day Exam:  DISCHARGE DAY EXAM:  BP (!) 148/101    Pulse 76    Temp 98 F (36.7 C) (Oral)    Resp 16    Ht 1.702 m (5\' 7" )    Wt 56.7 kg (125 lb)    SpO2 100%    BMI 19.58 kg/m   Body mass index is 19.58 kg/m.    Physical Exam  Constitutional:       Appearance: Normal appearance.   Pulmonary:      Effort: Pulmonary effort is normal. No respiratory distress.   Abdominal:      General: Abdomen is flat.   Musculoskeletal:      Right lower leg: No edema.      Left lower leg: No edema.   Neurological:      General: No focal deficit present.      Mental Status: He is alert. Mental status is at baseline.   Psychiatric:         Mood and Affect: Mood normal.         Thought Content: Thought content normal.        Discharge Condition & Coordination:     Condition: Stable    Coordination  Updated patient with discharge plan, all questions answered  Updated family on discharge plan     Emergency Contact  Extended Emergency Contact Information  Primary Emergency Contact: Nicole [place]  Home Phone: [phone]  Mobile Phone: [phone]  Relation: Mother    Pending Results, Recommendations & Instructions to providers after discharge:   1. Micro / Labs / Path pending:   Unresulted Labs     None        2. Date of completion for antibiotics or other medications: n/a    Discharge Instructions:   Diet:: CLD, advance as tolerated to bland  Activity: as tolerated    Instructions for after your discharge:  -- Schedule a follow up visit with your primary care provider - within 1 week of discharge from the hospital. If you do not have a PCP, you can follow-up with the [facility] clinic for  reevaluation. They will see you regardless of insurance. Information listed below.   -- Please have labs rechecked in 1 week with your primary care team to monitor your electrolytes, kidney function.    -- NEW: Capsaicin cream to abdomen three times daily.  -- NEW: Reglan 5 mg three times daily before meals.   -- NEW: Scopolamine patch for nausea, use as directed.   -- NEW: Alternate Zofran, Phenergan as needed for nausea. See directions below and on prescription.   -- Resume your home medications, no changes were made.  -- Please bring your medication in bottles or a list of medications, with name, dosage, and frequency to all your follow up appointments  -- Hydrate well, activity as tolerated. Advance diet as tolerated to bland, more information below.   -- Please stop using drugs and/or alcohol, we are concerned about your health. Contact information attached for detox programs through either the [facility], [employer], or the [facility] [employer]. Contact information for [facility] hospital behavioral health programs is also listed in case location is more convenient.   --  Return to the nearest emergency department for chest pain, shortness of breath, fever >100.5 degrees, dizziness, passing out, profuse vomiting or diarrhea, or any other concerning symptoms.     Complete instructions and follow up are in the patient's After Visit Summary    Minutes spent coordinating discharge and reviewing discharge plan:30 minutes    Follow-up Information      [facility] Follow up in 1 week(s).    Specialty: Family Medicine  Why: for after hospital visit within 1 week, or your primary care provider if you have one.   Contact information:  [address]  [place] [address]  [phone]           [facility] Behavioral health services Follow up.    Why: or place of choice  also consider connecting to [employer] for peer  support  Contact information:  [phone]   Detox., substance abuse tx. and or [facility]            [facility]. Call.    Specialty: Behavioral Health  Why: follow up with behavioral health services in the next 1-2 weeks (or place of your choice, [facility] services also listed in case that location is more convinient for you).   Contact information:  [address] [facility] [address]  [place] [address]  [phone]                    Signed by: Caryn Bee, FNP  Discussed in detail with my attending provider: Jerral Ralph, MD    [facility]  ADULT OBSERVATION UNIT [phone] F: [phone]  [facility]   Department of Medicine   P: [phone]   F: [phone]    CC: Pcp, None, MD  Please see attending note that follows this mid-level encounter note.

## 2020-04-23 NOTE — Progress Notes (Signed)
Nutritional Support Services  Nutrition Screening    Joel [name]. 29 y.o. male                                 MRN: [id number]    Referral Source: RN referral   Reason for Referral: MST -3, wt loss  and appetite loss     Clinical : 29 year old male with history of alcohol dependency, bipolar disorder, traumatic brain injury presented to ED with persistent nausea and vomiting found to have acute renal failure as well as other metabolic derangements likely from intractable vomiting r/l to marijuana use.     Subjective : asper pt since past two weeks he was depressed has not been eating much. He might have lost 10-15 # in past 2 weeks. As per epic wt record there is 2kg wt loss in past 4 months. Pt agreed on trying ensure CL supplement while on CL diet     Estimated Needs :   Calorie needs : 1417 kcal- 1701 kcal ( 25-30 kcal/kg)   Protein needs : 56 - 85 gm ( 1-1.5 gm/kg)     Orders Placed This Encounter   Procedures   . Diet clear liquid        PO Intake: tolerated CL today       Chart review completed.  Labs and meds noted.        Nutrition Recommendations :   1) recommend to advance the diet as per MD   2) ordered ensure CL supplement BID while on CL diet     Monitor and Evaluation :   Diet advancement , GI sign and symptoms, wt trend       Ardell Isaacs

## 2020-04-23 NOTE — Plan of Care (Signed)
Adult Observation Progress Note    Shift Note: Patient received to shift at 0700. Bedside report received from outgoing RN. The patient was expressing restlessness and anxiety. He wanted to go to the ATM. Security was called and problem was resolved,     Patients VSS, A and O x 4. Patient showed no signs pf physical distress, but was expressing anxiety and restlessness. Patients states pain a 0/10.     IV Access: Removed      BM during shift:  No, Last BM 04/18/2020    Pending Orders: Discharge    Discharge Plan: Discharge patient to home    Social/Family Visits: No    POC update: Patient updated about POC      Vitals:    04/22/20 1049 04/22/20 1909 04/22/20 2321 04/23/20 0527   BP: (!) 167/106 (!) 157/98 (!) 160/105 (!) 148/101   Pulse: 72 88 70 76   Resp: 18 16 17 16    Temp: 97.9 F (36.6 C) 99 F (37.2 C) 97.1 F (36.2 C) 98 F (36.7 C)   TempSrc: Oral Oral Oral Oral   SpO2: 99% 98% 97% 100%   Weight:       Height:           Patient Lines/Drains/Airways Status      Active Lines, Drains and Airways       Name:   Placement date:   Placement time:   Site:   Days:    Peripheral IV 04/23/20 20 G Left Antecubital   04/23/20    0852    Antecubital   less than 1                   Problem: Safety  Goal: Patient will be free from injury during hospitalization  Outcome: Progressing  Flowsheets (Taken 04/21/2020 2330 by Debby Freiberg, RN)  Patient will be free from injury during hospitalization:   Assess patient's risk for falls and implement fall prevention plan of care per policy   Provide and maintain safe environment   Use appropriate transfer methods   Hourly rounding   Include patient/ family/ care giver in decisions related to safety   Assess for patients risk for elopement and implement Elopement Risk Plan per policy   Ensure appropriate safety devices are available at the bedside  Goal: Patient will be free from infection during hospitalization  Outcome: Progressing  Flowsheets (Taken 04/21/2020 2330 by Debby Freiberg, RN)  Free from Infection during hospitalization:   Assess and monitor for signs and symptoms of infection   Monitor lab/diagnostic results   Monitor all insertion sites (i.e. indwelling lines, tubes, urinary catheters, and drains)   Encourage patient and family to use good hand hygiene technique     Problem: Pain  Goal: Pain at adequate level as identified by patient  Outcome: Progressing  Flowsheets (Taken 04/21/2020 2330 by Debby Freiberg, RN)  Pain at adequate level as identified by patient:   Identify patient comfort function goal   Assess pain on admission, during daily assessment and/or before any "as needed" intervention(s)   Evaluate if patient comfort function goal is met   Evaluate patient's satisfaction with pain management progress   Offer non-pharmacological pain management interventions   Include patient/patient care companion in decisions related to pain management as needed     Problem: Side Effects from Pain Analgesia  Goal: Patient will experience minimal side effects of analgesic therapy  Outcome: Progressing  Flowsheets (Taken 04/21/2020 2330 by Debby Freiberg,  RN)  Patient will experience minimal side effects of analgesic therapy:   Monitor/assess patient's respiratory status (RR depth, effort, breath sounds)   Assess for changes in cognitive function   Prevent/manage side effects per LIP orders (i.e. nausea, vomiting, pruritus, constipation, urinary retention, etc.)   Evaluate for opioid-induced sedation with appropriate assessment tool (i.e. POSS)     Problem: Discharge Barriers  Goal: Patient will be discharged home or other facility with appropriate resources  Outcome: Progressing  Flowsheets (Taken 04/21/2020 2330 by Debby Freiberg, RN)  Discharge to home or other facility with appropriate resources:   Provide appropriate patient education   Provide information on available health resources   Initiate discharge planning     Problem: Psychosocial and Spiritual Needs  Goal:  Demonstrates ability to cope with hospitalization/illness  Outcome: Progressing  Flowsheets (Taken 04/21/2020 2330 by Debby Freiberg, RN)  Demonstrates ability to cope with hospitalizations/illness:   Encourage verbalization of feelings/concerns/expectations   Provide quiet environment   Assist patient to identify own strengths and abilities   Encourage patient to set small goals for self   Include patient/ patient care companion in decisions   Encourage participation in diversional activity     Problem: Altered GI Function  Goal: Fluid and electrolyte balance are achieved/maintained  Outcome: Progressing  Flowsheets (Taken 04/21/2020 2330 by Debby Freiberg, RN)  Fluid and electrolyte balance are achieved/maintained:   Monitor intake and output every shift   Monitor/assess lab values and report abnormal values   Provide adequate hydration   Assess for confusion/personality changes   Assess and reassess fluid and electrolyte status   Observe for seizure activity and initiate seizure precautions if indicated  Goal: Elimination patterns are normal or improving  Outcome: Progressing  Flowsheets (Taken 04/21/2020 2330 by Debby Freiberg, RN)  Elimination patterns are normal or improving:   Report abnormal assessment to physician   Assess for normal bowel sounds   Monitor for abdominal distension   Monitor for abdominal discomfort   Assess for signs and symptoms of bleeding.  Report signs of bleeding to physician   Administer treatments as ordered   Encourage /perform oral hygiene as appropriate  Goal: Mobility/Activity is maintained at optimal level for patient  Outcome: Progressing  Flowsheets (Taken 04/21/2020 2330 by Debby Freiberg, RN)  Mobility/activity is maintained at optimal level for patient:   Increase mobility as tolerated/progressive mobility   Plan activities to conserve energy, plan rest periods   Assess for changes in respiratory status, level of consciousness and/or development of fatigue      Problem: Moderate/High Fall Risk Score >5  Goal: Patient will remain free of falls  Outcome: Progressing  Flowsheets (Taken 04/23/2020 1230)  Moderate Risk (6-13):   LOW-Fall Interventions Appropriate for Low Fall Risk   LOW-Anticoagulation education for injury risk     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 AS EVIDENCED BY:  Goal: Will identify long and short term goals based on individual needs and strengths  Outcome: Progressing  Goal: Patient achieves a safe detoxification and management of withdrawal symptoms  Outcome: Progressing  Goal: Patient educated about the disease of addiction and recovery and identifies long-term goal  Outcome: Progressing  Goal: Patient develops a discharge plan  Outcome: Progressing     Problem: Loss of functioning (Thought Disorder, Mood Disturbance and/or Severe Anxiety) AS EVIDENCED BY...  Goal: Will identify long and short term goals based on individual needs and strengths  Outcome: Progressing  Goal: Will remain safe during hospitalization  Outcome: Progressing  Goal: Maintains adequate nutrition/hydration  Outcome: Progressing  Goal: Maintains adequate sleep/rest pattern  Outcome: Progressing  Goal: Performs ADLs Independently  Outcome: Progressing  Goal: Completes discharge safety and recovery plan  Outcome: Progressing     Problem: Thought Disorder  Goal: Demonstrates orientation to person place and time  Outcome: Progressing  Goal: Conducts goal directed, coherent conversation  Outcome: Progressing  Goal: Verbalizes reduction in hallucinations/delusions  Outcome: Progressing     Problem: Mood Disorder  Goal: Reports improved mood  Outcome: Progressing  Goal: Demonstrates decreased episodes of intrusive behavior / boundary violations  Outcome: Progressing  Goal: Verbalizes reduction in hallucinations/delusions  Outcome: Progressing     Problem: Severe Anxiety  Goal: Verbalizes reduced anxiety  Outcome: Progressing

## 2020-04-23 NOTE — Progress Notes (Addendum)
Called Ms Robyne Askew Better at [phone] to confirm patient's Sertraline(Zoloft)'s dosage and frequency. She says 25 mg daily is the correct dose. I mentioned 50mg  daily then increase to 100mg  instructions from the PTA med list, she says it is "too high" dose for him, she seems to think that 25 mg daily is the right one. Advised to check the dose with his provider as the NP unable to reach the patient's pharmacy to confirm.

## 2020-04-23 NOTE — Plan of Care (Signed)
[facility]  ADULT OBSERVATION UNIT  DISCHARGE NOTE      Date Time: 04/23/20 3:18 PM  Patient Name: Joel [name].  Attending Physician: Jerral Ralph, MD      Date of Admission:   04/19/2020        Discharge Instructions:        Patient stable for discharge. Discharge instructions given, see below.     Instructions for after your discharge:  -- Schedule a follow up visit with your primary care provider - within 1 week of discharge from the hospital. If you do not have a PCP, you can follow-up with the [facility] clinic for reevaluation. They will see you regardless of insurance. Information listed below.   -- Please have labs rechecked in 1 week with your primary care team to monitor your electrolytes, kidney function.    -- NEW: Capsaicin cream to abdomen three times daily.  -- NEW: Reglan 5 mg three times daily before meals.   -- NEW: Scopolamine patch for nausea, use as directed.   -- NEW: Alternate Zofran, Phenergan as needed for nausea. See directions below and on prescription.   -- Resume your home medications as directed, no changes were made (see list below).   -- Please bring your medication in bottles or a list of medications, with name, dosage, and frequency to all your follow up appointments  -- Hydrate well, activity as tolerated. Advance diet as tolerated to bland, more information below.   -- Please stop using drugs and/or alcohol, we are concerned about your health. Contact information attached for detox programs through either the [facility], [employer], or the [facility] [employer]. Contact information for [facility] hospital behavioral health programs is also listed in case location is more convenient.   -- Return to the nearest emergency department for chest pain, shortness of breath, fever >100.5 degrees, dizziness, passing out, profuse vomiting or diarrhea, or any other concerning symptoms.      Patient teachings done and verbalized  understanding. All questions answered at this time. Aware to follow-up and schedule appointments as necessary. IV access and cardiac monitor discontinued. Discharge to home.     Wheelchair  transport requested, but while I was going to get the wheelchair, the patient left the unit.    Patient aware to follow-up with MD and PMD as needed.    Signed by: Clois Comber    Problem: Safety  Goal: Patient will be free from injury during hospitalization  Outcome: Progressing  Flowsheets (Taken 04/21/2020 2330 by Debby Freiberg, RN)  Patient will be free from injury during hospitalization:   Assess patient's risk for falls and implement fall prevention plan of care per policy   Provide and maintain safe environment   Use appropriate transfer methods   Hourly rounding   Include patient/ family/ care giver in decisions related to safety   Assess for patients risk for elopement and implement Elopement Risk Plan per policy   Ensure appropriate safety devices are available at the bedside  Goal: Patient will be free from infection during hospitalization  Outcome: Progressing  Flowsheets (Taken 04/21/2020 2330 by Debby Freiberg, RN)  Free from Infection during hospitalization:   Assess and monitor for signs and symptoms of infection   Monitor lab/diagnostic results   Monitor all insertion sites (i.e. indwelling lines, tubes, urinary catheters, and drains)   Encourage patient and family to use good hand hygiene technique     Problem: Pain  Goal: Pain at adequate level as identified by patient  Outcome: Progressing  Flowsheets (Taken 04/21/2020 2330 by Debby Freiberg, RN)  Pain at adequate level as identified by patient:   Identify patient comfort function goal   Assess pain on admission, during daily assessment and/or before any "as needed" intervention(s)   Evaluate if patient comfort function goal is met   Evaluate patient's satisfaction with pain management progress   Offer non-pharmacological pain management  interventions   Include patient/patient care companion in decisions related to pain management as needed     Problem: Side Effects from Pain Analgesia  Goal: Patient will experience minimal side effects of analgesic therapy  Outcome: Progressing  Flowsheets (Taken 04/21/2020 2330 by Debby Freiberg, RN)  Patient will experience minimal side effects of analgesic therapy:   Monitor/assess patient's respiratory status (RR depth, effort, breath sounds)   Assess for changes in cognitive function   Prevent/manage side effects per LIP orders (i.e. nausea, vomiting, pruritus, constipation, urinary retention, etc.)   Evaluate for opioid-induced sedation with appropriate assessment tool (i.e. POSS)     Problem: Discharge Barriers  Goal: Patient will be discharged home or other facility with appropriate resources  Outcome: Progressing  Flowsheets (Taken 04/21/2020 2330 by Debby Freiberg, RN)  Discharge to home or other facility with appropriate resources:   Provide appropriate patient education   Provide information on available health resources   Initiate discharge planning     Problem: Psychosocial and Spiritual Needs  Goal: Demonstrates ability to cope with hospitalization/illness  Outcome: Progressing  Flowsheets (Taken 04/21/2020 2330 by Debby Freiberg, RN)  Demonstrates ability to cope with hospitalizations/illness:   Encourage verbalization of feelings/concerns/expectations   Provide quiet environment   Assist patient to identify own strengths and abilities   Encourage patient to set small goals for self   Include patient/ patient care companion in decisions   Encourage participation in diversional activity     Problem: Altered GI Function  Goal: Fluid and electrolyte balance are achieved/maintained  Outcome: Progressing  Flowsheets (Taken 04/21/2020 2330 by Debby Freiberg, RN)  Fluid and electrolyte balance are achieved/maintained:   Monitor intake and output every shift   Monitor/assess lab values and report abnormal  values   Provide adequate hydration   Assess for confusion/personality changes   Assess and reassess fluid and electrolyte status   Observe for seizure activity and initiate seizure precautions if indicated  Goal: Elimination patterns are normal or improving  Outcome: Progressing  Flowsheets (Taken 04/21/2020 2330 by Debby Freiberg, RN)  Elimination patterns are normal or improving:   Report abnormal assessment to physician   Assess for normal bowel sounds   Monitor for abdominal distension   Monitor for abdominal discomfort   Assess for signs and symptoms of bleeding.  Report signs of bleeding to physician   Administer treatments as ordered   Encourage /perform oral hygiene as appropriate  Goal: Mobility/Activity is maintained at optimal level for patient  Outcome: Progressing  Flowsheets (Taken 04/21/2020 2330 by Debby Freiberg, RN)  Mobility/activity is maintained at optimal level for patient:   Increase mobility as tolerated/progressive mobility   Plan activities to conserve energy, plan rest periods   Assess for changes in respiratory status, level of consciousness and/or development of fatigue     Problem: Moderate/High Fall Risk Score >5  Goal: Patient will remain free of falls  Outcome: Progressing  Flowsheets (Taken 04/23/2020 1230)  Moderate Risk (6-13):   LOW-Fall Interventions Appropriate for Low Fall Risk   LOW-Anticoagulation  education for injury risk     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  Goal: Will identify long and short term goals based on individual needs and strengths  Outcome: Progressing  Goal: Will remain safe during hospitalization  Outcome: Progressing  Goal: Maintains adequate nutrition/hydration  Outcome: Progressing  Goal: Maintains adequate sleep/rest pattern  Outcome: Progressing  Goal: Performs ADLs Independently  Outcome: Progressing  Goal: Completes discharge safety and recovery plan  Outcome: Progressing     Problem: Thought Disorder  Goal: Demonstrates orientation to person place and time  Outcome: Progressing  Goal: Conducts goal directed, coherent conversation  Outcome: Progressing  Goal: Verbalizes reduction in hallucinations/delusions  Outcome: Progressing     Problem: Mood Disorder  Goal: Reports improved mood  Outcome: Progressing  Goal: Demonstrates decreased episodes of intrusive behavior / boundary violations  Outcome: Progressing  Goal: Verbalizes reduction in hallucinations/delusions  Outcome: Progressing     Problem: Severe Anxiety  Goal: Verbalizes reduced anxiety  Outcome: Progressing

## 2020-04-23 NOTE — Discharge Instructions (Signed)
Cannabinoid Hyperemesis Syndrome  What is cannabinoid hyperemesis syndrome?  Cannabinoid hyperemesis syndrome (CHS) is a condition that leads to repeated and severe bouts of vomiting. It is rare and only occurs in daily long-term users of marijuana.   Marijuana has several active substances. These include THC and related chemicals. These substances bind to molecules found in the brain. That causes the drug "high" and other effects that users feel.   Your digestive tract also has a number of molecules that bind to THC and related substances. So marijuana also affects the digestive tract. For example, the drug can change the time it takes the stomach to empty. It also affects the esophageal sphincter. That's the tight band of muscle that opens and closes to let food from the esophagus into the stomach. Long-term marijuana use can change the way the affected molecules respond and lead to the symptoms of CHS.   Marijuana is the most widely used illegal drug in the [place] Young adults are the most frequent users. A small number of these people develop CHS. It often only happens in people who have regularly used marijuana for several years. Often CHS affects those who use the drug at least once a day.   What causes cannabinoid hyperemesis syndrome?  Marijuana has very complex effects on the body. Experts are still trying to learn exactly how it causes CHS in some people.   In the brain, marijuana often has the opposite effect of CHS. It helps prevent nausea and vomiting. The drug is also good at stopping such symptoms in people having chemotherapy.   But in the digestive tract, marijuana seems to have the opposite effect. It actually makes you more likely to have nausea and vomiting. With the first use of marijuana, the signals from the brain may be more important. That may lead to anti-nausea effects at first. But with repeated use of marijuana, certain receptors in the brain may stop responding to the drug in the same  way. That may cause the repeated bouts of vomiting found in people with CHS.   It still isn't clear why some heavy marijuana users get the syndrome, but others don't.   What are the symptoms of cannabinoid hyperemesis syndrome?  People with CHS suffer from repeated bouts of vomiting. In between these episodes are times without any symptoms. Healthcare providers often divide these symptoms into 3 stages: the prodromal phase, the hyperemetic phase, and the recovery phase.   Prodromal phase.  During this phase, the main symptoms are often early morning nausea and belly (abdominal) pain. Some people also develop a fear of vomiting. Most people keep normal eating patterns during this time. Some people use more marijuana because they think it will help stop the nausea. This phase may last for months or years.   Hyperemetic phase.  Symptoms during this time may include:    Ongoing nausea   Repeated episodes of vomiting   Belly pain   Decreased food intake and weight loss   Symptoms of fluid loss (dehydration)  During this phase, vomiting is often intense and overwhelming. Many people take a lot of hot showers during the day. They find that doing so eases their nausea. (That may be because of how the hot temperature affects a part of the brain called the hypothalamus. This part of the brain effects both temperature regulation and vomiting.) People often first seek medical care during this phase.   The hyperemetic phase may continue until the person completely stops using marijuana. Then   the recovery phrase starts.   Recovery phase.  During this time, symptoms go away. Normal eating is possible again. This phase can last days or months. Symptoms often come back if the person tries marijuana again.   How is cannabinoid hyperemesis syndrome diagnosed?  Many health problems can cause repeated vomiting. To make a diagnosis, your healthcare provider will ask you about your symptoms and your past health. He or she will also  do a physical exam, including an exam of your belly.   Your healthcare provider may also need more tests to rule out other causes of the vomiting. That's especially the case for ones that may signal a health emergency. Based on your other symptoms, these tests might include:    Blood tests for anemia and infection   Tests for electrolytes   Tests for pancreas and liver enzymes, to check these organs   Pregnancy test   Urine analysis, to test for infection or other urinary causes   Drug screen, to test for drug-related causes of vomiting   X-rays of the belly, to check for things such as a blockage   Upper endoscopy, to view the stomach and esophagus for possible causes of vomiting   Head CT scan, if a nervous system cause of vomiting seems likely   Abdominal CT scan, to check for health problems that might need surgery  CHS was only recently discovered. So some healthcare providers may not know about it. As a result, they may not spot it for many years. They often confuse CHS with cyclical vomiting disorder. That is a health problem that causes similar symptoms. A specialist trained in diseases of the digestive tract (gastroenterologist) might make the diagnosis.   You may have CHS if you have all of these:    Long-term weekly and daily marijuana use   Belly pain   Severe, repeated nausea and vomiting   You feel better after taking a hot shower  There is no single test that confirms this diagnosis. Only improvement after quitting marijuana confirms the diagnosis.   How is cannabinoid hyperemesis syndrome treated?  If you have had severe vomiting, you might need to stay in the hospital for a short time. During the hyperemesis phase, you might need these treatments:    IV (intravenous) fluid replacement for dehydration   Medicines to help decrease vomiting   Pain medicine   Proton-pump inhibitors, to treat stomach inflammation   Frequent hot showers   Prescribed medicines that help calm you down  (benzodiazepines)   In a small sample of people with CHS, rubbing capsaicin cream on the belly helped decrease pain and nausea. The chemicals in the cream have the same effect as a hot shower  Symptoms often ease after a day or 2 unless marijuana is used before this time.   To fully get better, you need to stop using marijuana all together. Some people may get help from drug rehab programs to help them quit. Cognitive behavioral therapy or family therapy can also help. If you stop using marijuana, your symptoms should not come back.   What are possible complications of cannabinoid hyperemesis syndrome?   Very severe, prolonged vomiting may lead to dehydration. It may also lead to electrolyte problems in your blood. If untreated, these can cause rare complications such as:    Muscle spasms or weakness   Seizures   Kidney failure   Heart rhythm abnormalities   Shock   In very rare cases, brain swelling (cerebral edema)    Your healthcare team will quickly work to fix any dehydration or electrolyte problems. Doing so can help prevent these problems.   What can I do to prevent cannabinoid hyperemesis syndrome?  You can prevent CHS by not using marijuana in any form. You may not want to believe that marijuana may be the underlying cause of your symptoms. That may be because you have used it for many years without having any problems. The syndrome may take several years to develop. The drug may help prevent nausea in new users who don't use it often. But people with CHS need to completely stop using it. If they don't, their symptoms will likely come back.   Quitting marijuana may lead to other health benefits, such as:    Better lung function   Improved memory and thinking skills   Better sleep   Decreased risk for depression and anxiety  When should I call my healthcare provider?  Call your healthcare provider if you have had severe vomiting for a day or more.  Key points about cannabinoid hyperemesis  syndrome   CHS is a condition that leads to repeated and severe bouts of vomiting. It results from long-term use of marijuana.   Most people self-treat using hot showers to help reduce their symptoms.   Some people with CHS may not be diagnosed for several years. Admitting to your healthcare provider that you use marijuana daily can speed up the diagnosis.   You might need to stay in the hospital to treat dehydration from CHS.   Symptoms start to go away within a day or 2 after stopping marijuana use.   Symptoms almost always come back if you use marijuana again.    Next steps  Tips to help you get the most from a visit to your healthcare provider:   Know the reason for your visit and what you want to happen.   Before your visit, write down questions you want answered.   Bring someone with you to help you ask questions and remember what your provider tells you.   At the visit, write down the name of a new diagnosis, and any new medicines, treatments, or tests. Also write down any new instructions your provider gives you.   Know why a new medicine or treatment is prescribed, and how it will help you. Also know what the side effects are.   Ask if your condition can be treated in other ways.   Know why a test or procedure is recommended and what the results could mean.   Know what to expect if you do not take the medicine or have the test or procedure.   If you have a follow-up appointment, write down the date, time, and purpose for that visit.   Know how you can contact your provider if you have questions.  [employer] last reviewed this educational content on 05/31/2019   2000-2021 [employer]. All rights reserved. This information is not intended as a substitute for professional medical care. Always follow your healthcare professional's instructions.

## 2020-04-25 ENCOUNTER — Ambulatory Visit: Payer: Self-pay

## 2020-04-25 ENCOUNTER — Emergency Department
Admission: EM | Admit: 2020-04-25 | Discharge: 2020-04-25 | Discharge status: Against Medical Advice | Payer: Medicaid Managed Care Other | Attending: Emergency Medicine | Admitting: Emergency Medicine

## 2020-04-25 DIAGNOSIS — Z5321 Procedure and treatment not carried out due to patient leaving prior to being seen by health care provider: Secondary | ICD-10-CM | POA: Insufficient documentation

## 2020-04-25 DIAGNOSIS — R000 Tachycardia, unspecified: Secondary | ICD-10-CM

## 2020-04-25 NOTE — EDIE (Signed)
COLLECTIVE?NOTIFICATION?04/25/2020 07:16?ARPAN, ESKELSON E?MRN: [id number]    Criteria Met      5 ED Visits in 12 Months    Security and Safety  No recent Security Events currently on file    ED Care Guidelines  There are currently no ED Care Guidelines for this patient. Please check your facility's medical records system.        Prescription Monitoring Program  000??- Narcotic Use Score  000??- Sedative Use Score  000??- Stimulant Use Score  000??- Overdose Risk Score  - All Scores range from 000-999 with 75% of the population scoring < 200 and on 1% scoring above 650  - The last digit of the narcotic, sedative, and stimulant score indicates the number of active prescriptions of that type  - Higher Use scores correlate with increased prescribers, pharmacies, mg equiv, and overlapping prescriptions  - Higher Overdose Risk Scores correlate with increased risk of unintentional overdose death   Concerning or unexpectedly high scores should prompt a review of the PMP record; this does not constitute checking PMP for prescribing purposes.      E.D. Visit Count (12 mo.)  Facility Visits   [facility] 7   Total 7   Note: Visits indicate total known visits.     Recent Emergency Department Visit Summary  Date Facility [facility] Type Diagnoses or Chief Complaint   Apr 25, 2020 [place] [facility]      Triage      Apr 19, 2020 [place] [facility] [place] [facility] [place]      Triage A      Triage A dehydration      Emesis      Dehydration      Dec 14, 2019 [place] [facility] [place] [facility] [place]      Triage A      Dehydration      Chest Pain      Hypo-osmolality and hyponatremia      Acidosis      Acute kidney failure, unspecified      Dec 03, 2019 [place] [facility] [place] [facility] [place]      triage a      Nausea      Emesis      Abdominal Pain      Acute kidney failure, unspecified      Nausea with vomiting, unspecified      Dehydration      Dec 01, 2019  [facility] [place] [facility] [place] [place]       triage-      triage- dehydration      Emesis      Acute kidney failure, unspecified      Jul 13, 2019  - [place] [facility] [place]  [facility]      Triage A      Abdominal Pain      Triage B      Nausea      Emesis      Extremity Weakness      Personal history of other (healed) physical injury and trauma      Abnormal results of liver function studies      Abnormal electrocardiogram [ECG] [EKG]      Acute kidney failure, unspecified      Jul 08, 2019  [facility] [place] [facility] [place]  [facility]      TRIAGE A      Abdominal Pain      Emesis  Acute kidney failure, unspecified          Recent Inpatient Visit Summary  Date Facility [facility] Type Diagnoses or Chief Complaint   Apr 19, 2020 [place] [facility]      Dehydration      Acute kidney failure, unspecified      Vomiting, unspecified      Anxiety disorder, unspecified      Dec 01, 2019 [place] [facility] [place] [facility]      Acute kidney failure, unspecified      Jul 08, 2019 [place] [facility] [place]      Acute kidney failure, unspecified          Care Team  There is not a care team on record at this time.   Collective Portal  This patient has registered at the [facility] Emergency Department   For more information visit: [email or web address]     PLEASE NOTE:     1.   Any care recommendations and other clinical information are provided as guidelines or for historical purposes only, and providers should exercise their own clinical judgment when providing care.    2.   You may only use this information for purposes of treatment, payment or health care operations activities, and subject to the limitations of applicable [employer] Policies.    3.   You should consult directly with the organization that provided a care guideline or other clinical history with any questions about additional information or accuracy or completeness of information  provided.    ? 2021 [place] - [email or web address]

## 2020-04-26 LAB — ECG 12-LEAD
Atrial Rate: 110 {beats}/min
P Axis: 85 degrees
P-R Interval: 116 ms
Q-T Interval: 358 ms
QRS Duration: 112 ms
QTC Calculation (Bezet): 484 ms
R Axis: 101 degrees
T Axis: 69 degrees
Ventricular Rate: 110 {beats}/min

## 2020-06-10 ENCOUNTER — Inpatient Hospital Stay
Admission: EM | Admit: 2020-06-10 | Discharge: 2020-06-14 | DRG: 422 | Disposition: A | Discharge status: Home / Self Care | Payer: Medicaid Managed Care Other | Attending: Internal Medicine | Admitting: Internal Medicine

## 2020-06-10 DIAGNOSIS — R112 Nausea with vomiting, unspecified: Secondary | ICD-10-CM

## 2020-06-10 DIAGNOSIS — F1010 Alcohol abuse, uncomplicated: Secondary | ICD-10-CM | POA: Diagnosis present

## 2020-06-10 DIAGNOSIS — Z20822 Contact with and (suspected) exposure to covid-19: Secondary | ICD-10-CM | POA: Diagnosis present

## 2020-06-10 DIAGNOSIS — R109 Unspecified abdominal pain: Secondary | ICD-10-CM | POA: Diagnosis present

## 2020-06-10 DIAGNOSIS — N179 Acute kidney failure, unspecified: Secondary | ICD-10-CM | POA: Diagnosis present

## 2020-06-10 DIAGNOSIS — F1021 Alcohol dependence, in remission: Secondary | ICD-10-CM | POA: Diagnosis present

## 2020-06-10 DIAGNOSIS — F419 Anxiety disorder, unspecified: Secondary | ICD-10-CM

## 2020-06-10 DIAGNOSIS — R1115 Cyclical vomiting syndrome unrelated to migraine: Secondary | ICD-10-CM | POA: Diagnosis present

## 2020-06-10 DIAGNOSIS — Z87891 Personal history of nicotine dependence: Secondary | ICD-10-CM

## 2020-06-10 DIAGNOSIS — R1084 Generalized abdominal pain: Secondary | ICD-10-CM

## 2020-06-10 DIAGNOSIS — E876 Hypokalemia: Principal | ICD-10-CM | POA: Diagnosis present

## 2020-06-10 DIAGNOSIS — E870 Hyperosmolality and hypernatremia: Secondary | ICD-10-CM | POA: Diagnosis present

## 2020-06-10 DIAGNOSIS — R4689 Other symptoms and signs involving appearance and behavior: Secondary | ICD-10-CM | POA: Diagnosis not present

## 2020-06-10 DIAGNOSIS — R079 Chest pain, unspecified: Secondary | ICD-10-CM

## 2020-06-10 DIAGNOSIS — E860 Dehydration: Secondary | ICD-10-CM | POA: Diagnosis present

## 2020-06-10 DIAGNOSIS — F1210 Cannabis abuse, uncomplicated: Secondary | ICD-10-CM | POA: Diagnosis present

## 2020-06-10 LAB — CBC AND DIFFERENTIAL
Absolute NRBC: 0 10*3/uL (ref 0.00–0.00)
Basophils Absolute Automated: 0.07 10*3/uL (ref 0.00–0.08)
Basophils Automated: 0.3 %
Eosinophils Absolute Automated: 0 10*3/uL (ref 0.00–0.44)
Eosinophils Automated: 0 %
Hematocrit: 53.8 % — ABNORMAL HIGH (ref 37.6–49.6)
Hgb: 18.3 g/dL — ABNORMAL HIGH (ref 12.5–17.1)
Immature Granulocytes Absolute: 0.21 10*3/uL — ABNORMAL HIGH (ref 0.00–0.07)
Immature Granulocytes: 0.8 %
Lymphocytes Absolute Automated: 0.98 10*3/uL (ref 0.42–3.22)
Lymphocytes Automated: 3.6 %
MCH: 30.9 pg (ref 25.1–33.5)
MCHC: 34 g/dL (ref 31.5–35.8)
MCV: 90.7 fL (ref 78.0–96.0)
MPV: 11.4 fL (ref 8.9–12.5)
Monocytes Absolute Automated: 1.34 10*3/uL — ABNORMAL HIGH (ref 0.21–0.85)
Monocytes: 5 %
Neutrophils Absolute: 24.29 10*3/uL — ABNORMAL HIGH (ref 1.10–6.33)
Neutrophils: 90.3 %
Nucleated RBC: 0 /100 WBC (ref 0.0–0.0)
Platelets: 325 10*3/uL (ref 142–346)
RBC: 5.93 10*6/uL — ABNORMAL HIGH (ref 4.20–5.90)
RDW: 16 % — ABNORMAL HIGH (ref 11–15)
WBC: 26.89 10*3/uL — ABNORMAL HIGH (ref 3.10–9.50)

## 2020-06-10 MED ORDER — MORPHINE SULFATE 4 MG/ML IJ/IV SOLN (WRAP)
4.0000 mg | Freq: Once | Status: AC
Start: 2020-06-10 — End: 2020-06-10
  Administered 2020-06-10: 4 mg via INTRAVENOUS
  Filled 2020-06-10: qty 1

## 2020-06-10 MED ORDER — LACTATED RINGERS IV BOLUS
1000.0000 mL | Freq: Once | INTRAVENOUS | Status: AC
Start: 2020-06-10 — End: 2020-06-11
  Administered 2020-06-11: 1000 mL via INTRAVENOUS

## 2020-06-10 MED ORDER — ONDANSETRON HCL 4 MG/2ML IJ SOLN
4.0000 mg | Freq: Once | INTRAMUSCULAR | Status: AC
Start: 2020-06-10 — End: 2020-06-10
  Administered 2020-06-10: 23:00:00 4 mg via INTRAVENOUS
  Filled 2020-06-10: qty 2

## 2020-06-10 MED ORDER — LACTATED RINGERS IV BOLUS
1000.0000 mL | Freq: Once | INTRAVENOUS | Status: AC
Start: 2020-06-10 — End: 2020-06-11
  Administered 2020-06-10: 23:00:00 1000 mL via INTRAVENOUS

## 2020-06-10 NOTE — EDIE (Signed)
COLLECTIVE?NOTIFICATION?06/10/2020 21:48?Joel [name]?MRN: [id number]    Criteria Met      5 ED Visits in 12 Months    Security and Safety  No recent Security Events currently on file    ED Care Guidelines  There are currently no ED Care Guidelines for this patient. Please check your facility's medical records system.        Prescription Monitoring Program  000??- Narcotic Use Score  000??- Sedative Use Score  000??- Stimulant Use Score  000??- Overdose Risk Score  - All Scores range from 000-999 with 75% of the population scoring < 200 and on 1% scoring above 650  - The last digit of the narcotic, sedative, and stimulant score indicates the number of active prescriptions of that type  - Higher Use scores correlate with increased prescribers, pharmacies, mg equiv, and overlapping prescriptions  - Higher Overdose Risk Scores correlate with increased risk of unintentional overdose death   Concerning or unexpectedly high scores should prompt a review of the PMP record; this does not constitute checking PMP for prescribing purposes.      E.D. Visit Count (12 mo.)  Facility Visits   [place] [facility] 1   [facility] 7   Total 8   Note: Visits indicate total known visits.     Recent Emergency Department Visit Summary  Date Facility [facility] Type Diagnoses or Chief Complaint   Jun 10, 2020 [place] [facility] [place] [facility] [place]      dehydrated, vomiting      Apr 25, 2020 [place] [facility] [place] [facility]      Triage      Triage--N/V      Emesis      Dizziness      Chest Pain      Dehydration      Apr 19, 2020 [place] [facility] [place]      Triage A      Triage A dehydration      Emesis      Dehydration      Dec 14, 2019 [place] [facility] [place]      Triage A      Dehydration      Chest Pain      Hypo-osmolality and hyponatremia      Acidosis      Acute kidney failure, unspecified      Dec 03, 2019 [place] [facility] [place]      triage a      Nausea       Emesis      Abdominal Pain      Acute kidney failure, unspecified      Nausea with vomiting, unspecified      Dehydration      Dec 01, 2019 [place] [facility] [place] [facility] [place]      triage-      triage- dehydration      Emesis      Acute kidney failure, unspecified      Jul 13, 2019 [place] [facility]      Triage A      Abdominal Pain      Triage B      Nausea      Emesis      Extremity Weakness      Personal history of other (healed) physical injury and trauma      Abnormal results of liver function  studies      Abnormal electrocardiogram [ECG] [EKG]      Acute kidney failure, unspecified      Jul 08, 2019 [place] [facility] [place] [facility] [place]      TRIAGE A      Abdominal Pain      Emesis      Acute kidney failure, unspecified          Recent Inpatient Visit Summary  Date Facility [facility] Type Diagnoses or Chief Complaint   Apr 19, 2020 [place] [facility] [place] [facility] [place]      Dehydration      Acute kidney failure, unspecified      Vomiting, unspecified      Anxiety disorder, unspecified      Dec 01, 2019 [place] [facility]      Acute kidney failure, unspecified      Jul 08, 2019 [place] [facility] [place]      Acute kidney failure, unspecified          Care Team  There is not a care team on record at this time.   Collective Portal  This patient has registered at the [facility] Emergency Department   For more information visit: [email or web address]     PLEASE NOTE:     1.   Any care recommendations and other clinical information are provided as guidelines or for historical purposes only, and providers should exercise their own clinical judgment when providing care.    2.   You may only use this information for purposes of treatment, payment or health care operations activities, and subject to the limitations of applicable Collective Policies.    3.   You  should consult directly with the organization that provided a care guideline or other clinical history with any questions about additional information or accuracy or completeness of information provided.    ? 2021 [place], [employer] - [email or web address]

## 2020-06-10 NOTE — ED Triage Notes (Signed)
Pt reports dehydration form consistent emesis after using marijuana.

## 2020-06-11 ENCOUNTER — Emergency Department: Payer: Medicaid Managed Care Other

## 2020-06-11 DIAGNOSIS — R0602 Shortness of breath: Secondary | ICD-10-CM

## 2020-06-11 DIAGNOSIS — R109 Unspecified abdominal pain: Secondary | ICD-10-CM | POA: Diagnosis present

## 2020-06-11 DIAGNOSIS — R112 Nausea with vomiting, unspecified: Secondary | ICD-10-CM | POA: Diagnosis present

## 2020-06-11 DIAGNOSIS — F1210 Cannabis abuse, uncomplicated: Secondary | ICD-10-CM | POA: Diagnosis present

## 2020-06-11 DIAGNOSIS — F1010 Alcohol abuse, uncomplicated: Secondary | ICD-10-CM | POA: Diagnosis present

## 2020-06-11 DIAGNOSIS — E876 Hypokalemia: Secondary | ICD-10-CM | POA: Insufficient documentation

## 2020-06-11 LAB — LIPASE: Lipase: 7 U/L — ABNORMAL LOW (ref 8–78)

## 2020-06-11 LAB — COMPREHENSIVE METABOLIC PANEL
ALT: 7 U/L (ref 0–55)
ALT: 14 U/L (ref 0–55)
AST (SGOT): 9 U/L (ref 5–34)
AST (SGOT): 23 U/L (ref 5–34)
Albumin/Globulin Ratio: 1.7 (ref 0.9–2.2)
Albumin/Globulin Ratio: 1.5 (ref 0.9–2.2)
Albumin: 2.2 g/dL — ABNORMAL LOW (ref 3.5–5.0)
Albumin: 4.6 g/dL (ref 3.5–5.0)
Alkaline Phosphatase: 25 U/L — ABNORMAL LOW (ref 38–106)
Alkaline Phosphatase: 60 U/L (ref 38–106)
Anion Gap: 9 (ref 5.0–15.0)
Anion Gap: 17 — ABNORMAL HIGH (ref 5.0–15.0)
BUN: 8 mg/dL — ABNORMAL LOW (ref 9.0–28.0)
BUN: 19 mg/dL (ref 9.0–28.0)
Bilirubin, Total: 0.3 mg/dL (ref 0.2–1.2)
Bilirubin, Total: 0.9 mg/dL (ref 0.2–1.2)
CO2: 10 mEq/L — ABNORMAL LOW (ref 22–29)
CO2: 20 mEq/L — ABNORMAL LOW (ref 22–29)
Calcium: 4.8 mg/dL — CL (ref 8.5–10.5)
Calcium: 10.8 mg/dL — ABNORMAL HIGH (ref 8.5–10.5)
Chloride: 129 mEq/L — ABNORMAL HIGH (ref 100–111)
Chloride: 104 mEq/L (ref 100–111)
Creatinine: 0.7 mg/dL (ref 0.7–1.3)
Creatinine: 1.7 mg/dL — ABNORMAL HIGH (ref 0.7–1.3)
Globulin: 1.3 g/dL — ABNORMAL LOW (ref 2.0–3.6)
Globulin: 3.1 g/dL (ref 2.0–3.6)
Glucose: 63 mg/dL — ABNORMAL LOW (ref 70–100)
Glucose: 96 mg/dL (ref 70–100)
Potassium: 1.6 mEq/L — CL (ref 3.5–5.1)
Potassium: 4.9 mEq/L (ref 3.5–5.1)
Protein, Total: 3.5 g/dL — ABNORMAL LOW (ref 6.0–8.3)
Protein, Total: 7.7 g/dL (ref 6.0–8.3)
Sodium: 148 mEq/L — ABNORMAL HIGH (ref 136–145)
Sodium: 141 mEq/L (ref 136–145)

## 2020-06-11 LAB — CBC
Absolute NRBC: 0 10*3/uL (ref 0.00–0.00)
Hematocrit: 43.7 % (ref 37.6–49.6)
Hgb: 14.6 g/dL (ref 12.5–17.1)
MCH: 30.5 pg (ref 25.1–33.5)
MCHC: 33.4 g/dL (ref 31.5–35.8)
MCV: 91.2 fL (ref 78.0–96.0)
MPV: 11.6 fL (ref 8.9–12.5)
Nucleated RBC: 0 /100 WBC (ref 0.0–0.0)
Platelets: 262 10*3/uL (ref 142–346)
RBC: 4.79 10*6/uL (ref 4.20–5.90)
RDW: 16 % — ABNORMAL HIGH (ref 11–15)
WBC: 22.24 10*3/uL — ABNORMAL HIGH (ref 3.10–9.50)

## 2020-06-11 LAB — GFR
EGFR: >60
EGFR: 58

## 2020-06-11 LAB — GLUCOSE WHOLE BLOOD - POCT
Whole Blood Glucose POCT: 94 mg/dL (ref 70–100)
Whole Blood Glucose POCT: 106 mg/dL — ABNORMAL HIGH (ref 70–100)

## 2020-06-11 LAB — HEMOLYSIS INDEX
Hemolysis Index: 10 (ref 0–18)
Hemolysis Index: 14 (ref 0–18)
Hemolysis Index: 17 (ref 0–18)

## 2020-06-11 LAB — ETHANOL: Alcohol: NOT DETECTED mg/dL

## 2020-06-11 LAB — BLOOD GAS, VENOUS
Base Excess, Ven: -1.1 mEq/L
HCO3, Ven: 23.8 mEq/L
O2 Sat, Venous: 98.1 %
Temperature: 37
Venous Total CO2: 46.2 mEq/L
pCO2, Venous: 42.5 mmHg
pH, Ven: 7.366
pO2, Venous: 136 mmHg

## 2020-06-11 LAB — MAGNESIUM
Magnesium: 1 mg/dL — ABNORMAL LOW (ref 1.6–2.6)
Magnesium: 3.1 mg/dL — ABNORMAL HIGH (ref 1.6–2.6)

## 2020-06-11 LAB — COVID-19 (SARS-COV-2): SARS CoV 2 Overall Result: NEGATIVE

## 2020-06-11 MED ORDER — HALOPERIDOL LACTATE 5 MG/ML IJ SOLN
5.0000 mg | Freq: Once | INTRAMUSCULAR | Status: AC
Start: 2020-06-11 — End: 2020-06-11
  Administered 2020-06-11: 05:00:00 5 mg via INTRAVENOUS
  Filled 2020-06-11 (×3): qty 1

## 2020-06-11 MED ORDER — LACTATED RINGERS IV BOLUS
1000.0000 mL | Freq: Once | INTRAVENOUS | Status: AC
Start: 2020-06-11 — End: 2020-06-11
  Administered 2020-06-11: 02:00:00 1000 mL via INTRAVENOUS

## 2020-06-11 MED ORDER — METOCLOPRAMIDE HCL 5 MG/ML IJ SOLN
5.0000 mg | Freq: Four times a day (QID) | INTRAMUSCULAR | Status: DC | PRN
Start: 2020-06-11 — End: 2020-06-11

## 2020-06-11 MED ORDER — SENNOSIDES-DOCUSATE SODIUM 8.6-50 MG PO TABS
1.0000 | ORAL_TABLET | Freq: Every evening | ORAL | Status: DC | PRN
Start: 2020-06-11 — End: 2020-06-12

## 2020-06-11 MED ORDER — KCL IN DEXTROSE-NACL 20-5-0.45 MEQ/L-%-% IV SOLN
INTRAVENOUS | Status: DC
Start: 2020-06-11 — End: 2020-06-11

## 2020-06-11 MED ORDER — ONDANSETRON 4 MG PO TBDP
4.0000 mg | ORAL_TABLET | Freq: Four times a day (QID) | ORAL | Status: DC | PRN
Start: 2020-06-11 — End: 2020-06-14

## 2020-06-11 MED ORDER — ONDANSETRON HCL 4 MG/2ML IJ SOLN
4.0000 mg | Freq: Three times a day (TID) | INTRAMUSCULAR | Status: DC | PRN
Start: 2020-06-11 — End: 2020-06-11

## 2020-06-11 MED ORDER — CALCIUM GLUCONATE-NACL 1-0.675 GM/50ML-% IV SOLN
1.0000 g | Freq: Once | INTRAVENOUS | Status: AC
Start: 2020-06-11 — End: 2020-06-11
  Administered 2020-06-11: 02:00:00 1 g via INTRAVENOUS
  Filled 2020-06-11: qty 50

## 2020-06-11 MED ORDER — MELATONIN 3 MG PO TABS
3.0000 mg | ORAL_TABLET | Freq: Every evening | ORAL | Status: DC | PRN
Start: 2020-06-11 — End: 2020-06-14
  Administered 2020-06-11 – 2020-06-12 (×2): 3 mg via ORAL
  Filled 2020-06-11 (×2): qty 1

## 2020-06-11 MED ORDER — METOCLOPRAMIDE HCL 5 MG/ML IJ SOLN
5.0000 mg | Freq: Four times a day (QID) | INTRAMUSCULAR | Status: DC
Start: 2020-06-11 — End: 2020-06-13
  Administered 2020-06-11 – 2020-06-13 (×5): 5 mg via INTRAVENOUS
  Filled 2020-06-11 (×5): qty 2

## 2020-06-11 MED ORDER — IOHEXOL 350 MG/ML IV SOLN
100.0000 mL | Freq: Once | INTRAVENOUS | Status: AC | PRN
Start: 2020-06-11 — End: 2020-06-11
  Administered 2020-06-11: 100 mL via INTRAVENOUS

## 2020-06-11 MED ORDER — MAGNESIUM SULFATE IN D5W 1-5 GM/100ML-% IV SOLN
1.0000 g | INTRAVENOUS | Status: AC
Start: 2020-06-11 — End: 2020-06-11
  Administered 2020-06-11 (×2): 1 g via INTRAVENOUS
  Filled 2020-06-11 (×2): qty 100

## 2020-06-11 MED ORDER — DEXTROSE 50 % IV SOLN
12.5000 g | INTRAVENOUS | Status: DC | PRN
Start: 2020-06-11 — End: 2020-06-14

## 2020-06-11 MED ORDER — POTASSIUM CHLORIDE 10 MEQ/100ML IV SOLN
10.0000 meq | INTRAVENOUS | Status: DC
Start: 2020-06-11 — End: 2020-06-11
  Administered 2020-06-11 (×3): 10 meq via INTRAVENOUS
  Filled 2020-06-11 (×3): qty 100

## 2020-06-11 MED ORDER — ENOXAPARIN SODIUM 40 MG/0.4ML SC SOLN
40.0000 mg | Freq: Every day | SUBCUTANEOUS | Status: DC
Start: 2020-06-11 — End: 2020-06-14
  Administered 2020-06-11 – 2020-06-14 (×4): 40 mg via SUBCUTANEOUS
  Filled 2020-06-11 (×4): qty 0.4

## 2020-06-11 MED ORDER — ACETAMINOPHEN 650 MG RE SUPP
650.0000 mg | Freq: Four times a day (QID) | RECTAL | Status: DC | PRN
Start: 2020-06-11 — End: 2020-06-14

## 2020-06-11 MED ORDER — MORPHINE SULFATE 4 MG/ML IJ/IV SOLN (WRAP)
3.0000 mg | Status: DC | PRN
Start: 2020-06-11 — End: 2020-06-12

## 2020-06-11 MED ORDER — POTASSIUM CHLORIDE 10 MEQ/100ML IV SOLN
10.0000 meq | INTRAVENOUS | Status: DC | PRN
Start: 2020-06-11 — End: 2020-06-11

## 2020-06-11 MED ORDER — ONDANSETRON HCL 4 MG/2ML IJ SOLN
4.0000 mg | Freq: Four times a day (QID) | INTRAMUSCULAR | Status: DC | PRN
Start: 2020-06-11 — End: 2020-06-14
  Administered 2020-06-11 – 2020-06-13 (×7): 4 mg via INTRAVENOUS
  Filled 2020-06-11 (×7): qty 2

## 2020-06-11 MED ORDER — CALCIUM GLUCONATE-NACL 1-0.675 GM/50ML-% IV SOLN
1.0000 g | Freq: Once | INTRAVENOUS | Status: AC
Start: 2020-06-11 — End: 2020-06-11
  Administered 2020-06-11: 01:00:00 1 g via INTRAVENOUS
  Filled 2020-06-11: qty 50

## 2020-06-11 MED ORDER — POTASSIUM CHLORIDE 10 MEQ/100ML IV SOLN (WRAP)
10.0000 meq | INTRAVENOUS | Status: AC
Start: 2020-06-11 — End: 2020-06-11
  Administered 2020-06-11 (×2): 10 meq via INTRAVENOUS
  Filled 2020-06-11 (×2): qty 100

## 2020-06-11 MED ORDER — ACETAMINOPHEN 325 MG PO TABS
650.0000 mg | ORAL_TABLET | Freq: Four times a day (QID) | ORAL | Status: DC | PRN
Start: 2020-06-11 — End: 2020-06-14
  Filled 2020-06-11: qty 2

## 2020-06-11 MED ORDER — LORAZEPAM 2 MG/ML IJ SOLN
1.0000 mg | Freq: Three times a day (TID) | INTRAMUSCULAR | Status: DC | PRN
Start: 2020-06-11 — End: 2020-06-14
  Administered 2020-06-11 – 2020-06-12 (×3): 1 mg via INTRAVENOUS
  Filled 2020-06-11 (×3): qty 1

## 2020-06-11 MED ORDER — GLUCAGON 1 MG IJ SOLR (WRAP)
1.0000 mg | INTRAMUSCULAR | Status: DC | PRN
Start: 2020-06-11 — End: 2020-06-14

## 2020-06-11 MED ORDER — NALOXONE HCL 0.4 MG/ML IJ SOLN (WRAP)
0.2000 mg | INTRAMUSCULAR | Status: DC | PRN
Start: 2020-06-11 — End: 2020-06-14

## 2020-06-11 MED ORDER — MORPHINE SULFATE 2 MG/ML IJ/IV SOLN (WRAP)
2.0000 mg | Freq: Once | Status: AC
Start: 2020-06-11 — End: 2020-06-11
  Administered 2020-06-11: 06:00:00 2 mg via INTRAVENOUS
  Filled 2020-06-11: qty 1

## 2020-06-11 MED ORDER — DEXTROSE IN LACTATED RINGERS 5 % IV SOLN
INTRAVENOUS | Status: DC
Start: 2020-06-11 — End: 2020-06-14

## 2020-06-11 MED ORDER — MAGNESIUM SULFATE IN D5W 1-5 GM/100ML-% IV SOLN
1.0000 g | Freq: Once | INTRAVENOUS | Status: DC
Start: 2020-06-11 — End: 2020-06-11

## 2020-06-11 MED ORDER — GLUCOSE 40 % PO GEL
15.0000 g | ORAL | Status: DC | PRN
Start: 2020-06-11 — End: 2020-06-14
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
## 2020-06-11 NOTE — Plan of Care (Signed)
Pt was a transfer from unit 25 , came with diagnosis of nausea and abd pain. (hx of marijuana use)Pt requesting to have multiple showers, had one earlier on unit 25 and one upon arrival to this unit. At this time requested again, instructed not now since he just had large emesis and reglan was just given, for his safety he would need to wake til later.Pt has had 3 episodes of emesis-food particles in emesis basin noted. At dinner noted that her was eating very fast , encouraged not to eat fast. Shortly afterwards he threw up his dinner. Pt maintained on iv fluids, and nausea medication. Optimal comfort to be maintained.

## 2020-06-11 NOTE — Progress Notes (Signed)
Pt arrived to the unit around 0410. Pt demanded to take a shower. Charge called the MD and MD approved. The pt did not come out of shower until 0506. Tele was placed on pt. Pt took out IV on right [facility] because it bothered him. I told pt that we need to keep the IV on hisleft arm because he still needed to get IV K and 2nd bag of Mag ( per ER nurse Renae Fickle, pt got one bag of mag in ER and he scanned second bag but brought it up so that it could be given here.). While I was giving Haldol, pt started to shake, complain that he could not breathe, and that he was dizzy BP 167/102. Rapid was called but pt did not like people being in his room so he perked up. Rapid was canceled.  EKG done. Morphine given for abdominal pain.  Around 0700, pt took off tele and wanted to take another shower. I gave pt PRN ativan as ordered. Pt is finishing up his 2nd bag of IV mag. Pt stable at change of shift

## 2020-06-11 NOTE — ED Notes (Signed)
[facility] EMERGENCY DEPARTMENT  ED NURSING NOTE FOR THE RECEIVING INPATIENT NURSE   ED NURSE [phone]   [facility]   ED CHARGE RN [phone]   ADMISSION INFORMATION   Joel [name]. is a 29 y.o. male admitted with a diagnosis of:    1. Hypokalemia    2. Hypocalcemia    3. Intractable vomiting with nausea, unspecified vomiting type    4. Generalized abdominal pain    5. Hypernatremia         Isolation: None   Allergies: Pollen extract   Holding Orders confirmed? Yes   Belongings Documented? Yes   Home medications sent to pharmacy confirmed? No   NURSING CARE   Patient Comes From:   Mental Status: Home Independent  alert and oriented   ADL: Independent with all ADLs   Ambulation: mild difficulty   Pertinent Information  and Safety Concerns: none     COVID Test sent to lab? Yes   VITAL SIGNS   Time BP Temp Pulse Resp SpO2   0200 164/89 97.4 76 19 97   CT / NIH   CT Head ordered on this patient?  No   NIH/Dysphagia assessment done prior to admission? No   PERSONAL PROTECTIVE EQUIPMENT   Gloves, Goggles and N95   LAB RESULTS   Labs Reviewed   CBC AND DIFFERENTIAL - Abnormal; Notable for the following components:       Result Value    WBC 26.89 (*)     Hgb 18.3 (*)     Hematocrit 53.8 (*)     RBC 5.93 (*)     RDW 16 (*)     Neutrophils Absolute 24.29 (*)     Monocytes Absolute Automated 1.34 (*)     Immature Granulocytes Absolute 0.21 (*)     All other components within normal limits   LIPASE - Abnormal; Notable for the following components:    Lipase 7 (*)     All other components within normal limits   COMPREHENSIVE METABOLIC PANEL - Abnormal; Notable for the following components:    Glucose 63 (*)     BUN 8.0 (*)     Sodium 148 (*)     Potassium 1.6 (*)     Chloride 129 (*)     CO2 10 (*)     Calcium 4.8 (*)     Protein, Total 3.5 (*)     Albumin 2.2 (*)     Alkaline Phosphatase 25 (*)     Globulin 1.3 (*)     All other components within normal limits   MAGNESIUM - Abnormal; Notable for the following components:     Magnesium 1.0 (*)     All other components within normal limits   COVID-19 (SARS-COV-2)    Narrative:     o Collect and clearly label specimen type:  o Upper respiratory specimen: One Nasopharyngeal Dry Swab NO  Transport Media.  o Hand deliver to laboratory ASAP  Indication for testing->Extended care facility admission to  semi private room  Screening   ETHANOL   HEMOLYSIS INDEX   GFR   HEMOLYSIS INDEX   HEMOLYSIS INDEX   BLOOD GAS, VENOUS   BASIC METABOLIC PANEL   HEMOLYSIS INDEX   GFR   GLUCOSE WHOLE BLOOD - POCT          Ticket to Ride Printed: Yes
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
## 2020-06-11 NOTE — ED Notes (Signed)
Per Dr. Fran Lowes. Ok to give Haldol after bolus of mag sulfate 1g followed by another bolus of mag after Haldol admin.

## 2020-06-11 NOTE — ED Notes (Signed)
2:0 AM -  Recieved signout from Dr. Pattricia Boss.    3:04 AM - D/w Dr. Isaac Laud [facility] inpatient.  Will rpt BMP and add VBG to check lytes/acid status.     Provider Notes: Multiple electrolyte abnormalities (hypoK, hypoCA and hyperNA) in setting of abd pain/vomiting.  Correct lytes.  CT no acute.     Clinical Impression:   1. Hypokalemia    2. Hypocalcemia    3. Intractable vomiting with nausea, unspecified vomiting type    4. Generalized abdominal pain    5. Hypernatremia        ED Disposition     ED Disposition Condition Date/Time Comment    Admit  Wed Jun 11, 2020  3:04 AM Admitting Physician: Clarita Leber [[id number]]   Service:: Medicine [106]   Estimated Length of Stay: > or = to 2 midnights   Tentative Discharge Plan?: Home or Self Care [1]   Does patient need telemetry?: Yes   Telemetry type (separate Telemetry order is also required):: Medical telemetry               Tenny Craw, MD  06/25/20 [phone]
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
## 2020-06-12 LAB — CBC
Absolute NRBC: 0 10*3/uL (ref 0.00–0.00)
Hematocrit: 44 % (ref 37.6–49.6)
Hgb: 14 g/dL (ref 12.5–17.1)
MCH: 30.6 pg (ref 25.1–33.5)
MCHC: 31.8 g/dL (ref 31.5–35.8)
MCV: 96.1 fL — ABNORMAL HIGH (ref 78.0–96.0)
MPV: 12 fL (ref 8.9–12.5)
Nucleated RBC: 0 /100 WBC (ref 0.0–0.0)
Platelets: 157 10*3/uL (ref 142–346)
RBC: 4.58 10*6/uL (ref 4.20–5.90)
RDW: 17 % — ABNORMAL HIGH (ref 11–15)
WBC: 14.9 10*3/uL — ABNORMAL HIGH (ref 3.10–9.50)

## 2020-06-12 LAB — RAPID DRUG SCREEN, URINE
Barbiturate Screen, UR: NEGATIVE
Benzodiazepine Screen, UR: NEGATIVE
Cannabinoid Screen, UR: POSITIVE — AB
Cocaine, UR: NEGATIVE
Opiate Screen, UR: POSITIVE — AB
PCP Screen, UR: NEGATIVE
Urine Amphetamine Screen: NEGATIVE

## 2020-06-12 LAB — ECG 12-LEAD
Atrial Rate: 89 {beats}/min
Atrial Rate: 101 {beats}/min
P Axis: 91 degrees
P Axis: 85 degrees
P-R Interval: 134 ms
P-R Interval: 126 ms
Q-T Interval: 396 ms
Q-T Interval: 380 ms
QRS Duration: 122 ms
QRS Duration: 122 ms
QTC Calculation (Bezet): 481 ms
QTC Calculation (Bezet): 492 ms
R Axis: 103 degrees
R Axis: 92 degrees
T Axis: 81 degrees
T Axis: 80 degrees
Ventricular Rate: 89 {beats}/min
Ventricular Rate: 101 {beats}/min

## 2020-06-12 LAB — URINALYSIS REFLEX TO MICROSCOPIC EXAM - REFLEX TO CULTURE
Bilirubin, UA: NEGATIVE
Blood, UA: NEGATIVE
Glucose, UA: NEGATIVE
Leukocyte Esterase, UA: NEGATIVE
Nitrite, UA: NEGATIVE
Protein, UR: 100 — AB
Specific Gravity UA: >1.035 — AB (ref 1.001–1.035)
Urine pH: 6 (ref 5.0–8.0)
Urobilinogen, UA: NEGATIVE mg/dL (ref 0.2–2.0)

## 2020-06-12 LAB — BASIC METABOLIC PANEL
Anion Gap: 10 (ref 5.0–15.0)
BUN: 16 mg/dL (ref 9.0–28.0)
CO2: 25 mEq/L (ref 22–29)
Calcium: 10 mg/dL (ref 8.5–10.5)
Chloride: 104 mEq/L (ref 100–111)
Creatinine: 1.3 mg/dL (ref 0.7–1.3)
Glucose: 102 mg/dL — ABNORMAL HIGH (ref 70–100)
Potassium: 4.3 mEq/L (ref 3.5–5.1)
Sodium: 139 mEq/L (ref 136–145)

## 2020-06-12 LAB — GFR: EGFR: >60

## 2020-06-12 LAB — HEMOLYSIS INDEX: Hemolysis Index: 19 — ABNORMAL HIGH (ref 0–18)

## 2020-06-12 MED ORDER — OXYCODONE HCL 5 MG PO TABS
5.0000 mg | ORAL_TABLET | Freq: Four times a day (QID) | ORAL | Status: DC | PRN
Start: 2020-06-12 — End: 2020-06-14
  Administered 2020-06-13: 5 mg via ORAL
  Filled 2020-06-12 (×2): qty 1

## 2020-06-12 MED ORDER — MORPHINE SULFATE 2 MG/ML IJ/IV SOLN (WRAP)
1.0000 mg | Status: DC | PRN
Start: 2020-06-12 — End: 2020-06-14
  Administered 2020-06-13 (×2): 1 mg via INTRAVENOUS
  Filled 2020-06-12 (×3): qty 1

## 2020-06-12 MED ORDER — SENNOSIDES-DOCUSATE SODIUM 8.6-50 MG PO TABS
1.0000 | ORAL_TABLET | Freq: Every evening | ORAL | Status: DC
Start: 2020-06-12 — End: 2020-06-14
  Administered 2020-06-13: 21:00:00 1 via ORAL
  Filled 2020-06-12: qty 1

## 2020-06-12 NOTE — UM Notes (Signed)
[facility] Utilization Review  NPI: [id number], Tax ID: [id number]  Please Call Joel Hausen, MSN, RN @ [phone] with any questions or concerns. Confidential voicemail.  Email: [name]   Fax final authorizations and requests for additional information to [phone]      06/12/20 Continued Stay Review     Joel Baetz. is a 29 y.o. male admitted with Nausea & vomiting    Interval Summary:         Active Hospital Problems    Diagnosis    Hypokalemia    Hypocalcemia    Nausea & vomiting    Abdominal pain     Marijuana abuse    Alcohol use disorder, moderate, in early remission    Acute kidney injury    Dehydration       Patient Active Hospital Problem List:  Nausea &vomiting (06/11/2020)  Abdominal pain (06/11/2020)  Dehydration (04/19/2020)  Acute kidney injury (04/20/2020)  Hypokalemia (06/11/2020)  Hypocalcemia (06/11/2020)  Assessment: Dehydration attributed to nausea and vomiting likely from cyclic vomiting given marijuana abuse;Initial labs appear to be erroneous given unexpectedly swift resolution of the calcium and potassium levels; creatinine is gradually trending down  Plan: Continue with D5 with lactated Ringer at 100 cc  pain control with morphine 1 mg IV every 4 hours as needed for severe pain and oxycodone 5 mg every 6 hours for moderate pain  Due to persistent nausea and vomiting, in addition to Zofran and Reglan started on as needed Phenergan  Patient has also been receiving IV Ativan that has resulted in some relief in symptoms  Continue with Protonix 40 mg IV daily  Ordered clear liquids and advance as tolerated    GI Prophylaxis  protonix IV    Analgesia: Morphine IV; oxycodone    Nutrition: Ordered regular diet but patient is not able to tolerate at this time; recommended to stick with clear liquids and advance as tolerated    DVT Prophylaxis: Lovenox    Code Status: Full code     SUBJECTIVE     Joel Canard.  states that he continues to have recurrent episodes of nausea and vomiting.  He threw up solid foods that he had earlier today.  Admits to having some abdominal pain as well    Temp:  [98.4 F (36.9 C)-98.8 F (37.1 C)] 98.6 F (37 C)  Heart Rate:  [65-98] 92  Resp Rate:  [14-18] 17  BP: (89-125)/(51-78) 103/69     Lab Results last 48 Hours     Procedure Component Value Units Date/Time    ADULT Urinalysis Reflex to Microscopic Exam - Reflex to Culture [[id number]]  (Abnormal) Collected: 06/12/20 0334    Specimen: Urine, Clean Catch Updated: 06/12/20 0736     Specific Gravity UA >1.035     Protein, UR 100     Glucose, UA Negative     Ketones UA Trace    Hemolysis index [[id number]]  (Abnormal) Collected: 06/12/20 0334     Updated: 06/12/20 0435     Hemolysis Index 19    GFR [[id number]] Collected: 06/12/20 0334     Updated: 06/12/20 0435     EGFR >60.0    Basic Metabolic Panel [[id number]]  (Abnormal) Collected: 06/12/20 0334    Specimen: Blood Updated: 06/12/20 0435     Glucose 102 mg/dL     Rapid drug screen, urine [[id number]]  (Abnormal) Collected: 06/12/20 0334    Specimen: Urine Updated: 06/12/20 0424     Cannabinoid Screen, UR  Positive     Cocaine, UR Negative     Opiate Screen, UR Positive     PCP Screen, UR Negative    CBC without differential [[id number]]  (Abnormal) Collected: 06/12/20 0334    Specimen: Blood Updated: 06/12/20 0416     WBC 14.90 x10 3/uL      MCV 96.1 fL      MCH 30.6 pg      MCHC 31.8 g/dL      RDW 17 %     Basic Metabolic Panel [[id number]] Collected: 06/12/20 0334    Specimen: Blood Updated: 06/12/20 0336        Scheduled Meds:  Current Facility-Administered Medications  Medication Dose Route Frequency   enoxaparin  40 mg Subcutaneous Daily   metoclopramide  5 mg Intravenous 4 times per day    Continuous Infusions:   dextrose 5% lactated ringers 100 mL/hr at 06/11/20 1229    PRN Meds:.acetaminophen **OR** acetaminophen, Nursing communication: Adult Hypoglycemia Treatment Algorithm  **AND** dextrose **AND** dextrose **AND** glucagon (rDNA), LORazepam, melatonin, morphine, naloxone, ondansetron **OR** ondansetron, senna-docusate      Joel Hausen, RN, MSN  Utilization Review Case Management  [phone] (P)  [phone] (F)

## 2020-06-12 NOTE — Progress Notes (Signed)
HOSPITALIST PROGRESS NOTE  [facility]      Patient: Joel [name].  Date: 06/12/2020    LOS: 1 Days  Admission Date: 06/10/2020   MRN: [id number]  Attending: Teodora Medici, MD  MD, MPH                    Please contact via Secure Chat                    Spectralink: [phone]                    Pager: [phone]     ASSESSMENT/PLAN     Joel [name]. is a 29 y.o. male admitted with Nausea & vomiting    Interval Summary:     Active Hospital Problems    Diagnosis    Hypokalemia    Hypocalcemia    Nausea & vomiting    Abdominal pain     Marijuana abuse    Alcohol use disorder, moderate, in early remission    Acute kidney injury    Dehydration       Patient Active Hospital Problem List:  Nausea & vomiting (06/11/2020)   Abdominal pain  (06/11/2020)   Dehydration (04/19/2020)   Acute kidney injury (04/20/2020)   Hypokalemia (06/11/2020)   Hypocalcemia (06/11/2020)    Assessment: Dehydration attributed to nausea and vomiting likely from cyclic vomiting given marijuana abuse; Initial labs appear to be erroneous given unexpectedly swift resolution of the calcium and potassium levels; creatinine is gradually trending down    Plan: Continue with D5 with lactated Ringer at 100 cc   pain control with morphine 1 mg IV every 4 hours as needed for severe pain and oxycodone 5 mg every 6 hours for moderate pain  Due to persistent nausea and vomiting, in addition to Zofran and Reglan started on as needed Phenergan  Patient has also been receiving IV Ativan that has resulted in some relief in symptoms  Continue with Protonix 40 mg IV daily  Ordered clear liquids and advance as tolerated    GI Prophylaxis  protonix IV     Analgesia: Morphine IV; oxycodone    Nutrition: Ordered regular diet but patient is not able to tolerate at this time; recommended to stick with clear liquids and advance as tolerated    DVT Prophylaxis: Lovenox    Code Status: Full code    DISPO: Discharge currently on hold on  account of persistent nausea and vomiting and inability to tolerate liquids or solids.  Discussed with wife at the bedside       SUBJECTIVE     Joel [name]. states that he continues to have recurrent episodes of nausea and vomiting.  He threw up solid foods that he had earlier today.  Admits to having some abdominal pain as well    Febrile- None   Eating-little but continues to throw up now  Coughing- None   Diarrhea- None   Sleeping - Well   Voiding - Well        MEDICATIONS     Current Facility-Administered Medications   Medication Dose Route Frequency    enoxaparin  40 mg Subcutaneous Daily    metoclopramide  5 mg Intravenous 4 times per day       PHYSICAL EXAM     Vitals:    06/12/20 1618   BP: 103/69   Pulse: 92   Resp: 17   Temp: 98.6  F (37 C)   SpO2: 100%       Temperature: Temp  Min: 98.4 F (36.9 C)  Max: 98.8 F (37.1 C)  Pulse: Pulse  Min: 65  Max: 98  Respiratory: Resp  Min: 14  Max: 18  Non-Invasive BP: BP  Min: 89/51  Max: 125/73  Pulse Oximetry SpO2  Min: 98 %  Max: 100 %    Intake and Output Summary (Last 24 hours) at Date Time    Intake/Output Summary (Last 24 hours) at 06/12/2020 1937  Last data filed at 06/12/2020 0800  Gross per 24 hour   Intake 1896 ml   Output    Net 1896 ml       Physical Exam  Constitutional:       General: He is not in acute distress.     Appearance: He is not diaphoretic.   HENT:      Head: Normocephalic and atraumatic.      Right Ear: External ear normal.      Left Ear: External ear normal.   Eyes:      Pupils: Pupils are equal, round, and reactive to light.   Neck:      Thyroid: No thyromegaly.      Vascular: No JVD.      Trachea: No tracheal deviation.   Cardiovascular:      Rate and Rhythm: Normal rate and regular rhythm.      Heart sounds: Normal heart sounds. No murmur heard.   No friction rub. No gallop.    Pulmonary:      Effort: Pulmonary effort is normal. No respiratory distress.      Breath sounds: Normal breath sounds. No stridor. No wheezing  or rales.   Chest:      Chest wall: No tenderness.   Abdominal:      General: Bowel sounds are normal. There is no distension.      Palpations: Abdomen is soft. There is no mass.      Tenderness: There is abdominal tenderness (Lower abdominal). There is no guarding or rebound.   Musculoskeletal:         General: No tenderness or deformity. Normal range of motion.      Cervical back: Normal range of motion and neck supple.   Lymphadenopathy:      Cervical: No cervical adenopathy.   Skin:     General: Skin is warm and dry.      Coloration: Skin is not pale.      Findings: No erythema or rash.   Neurological:      Mental Status: He is alert and oriented to person, place, and time.      Cranial Nerves: No cranial nerve deficit.      Motor: No abnormal muscle tone.      Coordination: Coordination normal.      Gait: Gait is intact.      Deep Tendon Reflexes: Reflexes are normal and symmetric.   Psychiatric:         Mood and Affect: Mood and affect normal.         Cognition and Memory: Memory normal.         Judgment: Judgment normal.             LABS     Recent Labs   Lab 06/12/20  0334 06/11/20  0846 06/10/20  2253   WBC 14.90* 22.24* 26.89*   RBC 4.58 4.79 5.93*   Hgb 14.0 14.6 18.3*   Hematocrit  44.0 43.7 53.8*   MCV 96.1* 91.2 90.7   Platelets 157 262 325       Recent Labs   Lab 06/12/20  0334 06/11/20  0846 06/10/20  2323   Sodium 139 141 148*   Potassium 4.3 4.9 1.6*   Chloride 104 104 129*   CO2 25 20* 10*   BUN 16.0 19.0 8.0*   Creatinine 1.3 1.7* 0.7   Glucose 102* 96 63*   Calcium 10.0 10.8* 4.8*   Magnesium  --  3.1* 1.0*       Recent Labs   Lab 06/11/20  0846 06/10/20  2323   ALT 14 7   AST (SGOT) 23 9   Bilirubin, Total 0.9 0.3   Albumin 4.6 2.2*   Alkaline Phosphatase 60 25*                   Microbiology Results (last 15 days)     Procedure Component Value Units Date/Time    COVID-19 (SARS-COV-2) [facility] Rapid) [[id number]] Collected: 06/11/20 0223    Order Status: Completed Specimen: Nasopharyngeal Swab from  Nasopharynx Updated: 06/11/20 0250     Purpose of COVID testing Screening     SARS-CoV-2 Specimen Source Nasopharyngeal     SARS CoV 2 Overall Result Negative     Comment: Test performed using the Abbott ID NOW EUA assay.  Please see Fact Sheets for patients and providers located at:  http://www.rice.biz/    This test is for the qualitative detection of SARS-CoV-2  (COVID19) nucleic acid. Viral nucleic acids may persist in vivo,  independent of viability. Detection of viral nucleic acid does  not imply the presence of infectious virus, or that virus  nucleic acid is the cause of clinical symptoms. Negative  results should be treated as presumptive and, if inconsistent  with clinical signs and symptoms or necessary for patient  management, should be tested with an alternative molecular  assay. Negative results do not preclude SARS-CoV-2 infection  and should not be used as the sole basis for patient  management decisions. Invalid results may be due to inhibiting  substances in the specimen and recollection should occur.         Narrative:      o Collect and clearly label specimen type:  o Upper respiratory specimen: One Nasopharyngeal Dry Swab NO  Transport Media.  o Hand deliver to laboratory ASAP  Indication for testing->Extended care facility admission to  semi private room  Screening           CT Abd/Pelvis with IV Contrast only    Result Date: 06/11/2020  Mild gastritis morphology in the appropriate clinical setting There are other chronic/incidental findings as described.  Candie Chroman, MD  06/11/2020 2:18 AM      Echo Results     None          I have personally reviewed the patient's labs, medications, and imaging    Total visit time = 25 mins ; more than 50% spent counseling/coordinating care    Signed,    Teodora Medici MD, MPH  7:37 PM 06/12/2020   Please contact via Secure Chat   SpectraLink: [phone]  Pager: [phone]       This chart was generated using a medical voice-recognition software  which does not employ spell-checking or grammar-checking features. It was dictated, all or in part, in a busy and often noisy patient care environment. I have taken all usual measures to dictate carefully and to review  all aspects of this chart. Nonetheless, given the known and well-documented performance characteristics of voice recognition software in such patient care environments, this dictation still may contain unrecognized and wholly unintended errors or omissions.

## 2020-06-12 NOTE — Plan of Care (Signed)
Pt is A*Ox4. Can be very impulsive,anxious,agitated,and poor safety judgement. Reorientation given.VSS. Denies any pain or any distress. Tolerating regular diet- Pt had 2 episode of emesis, given antiemetics as needed with good result.+GAS, -BM,+BS. Plan: will continue to monitor, IVF, and maintain fall precaution.          Problem: Altered GI Function  Goal: Nutritional intake is adequate  Outcome: Not Progressing  Flowsheets (Taken 06/12/2020 1843)  Nutritional intake is adequate:   Monitor daily weights   Assist patient with meals/food selection   Allow adequate time for meals   Encourage/perform oral hygiene as appropriate   Encourage/administer dietary supplements as ordered (i.e. tube feed, TPN, oral, OGT/NGT, supplements)   Include patient/patient care companion in decisions related to nutrition     Problem: Moderate/High Fall Risk Score >5  Goal: Patient will remain free of falls  Outcome: Progressing     Problem: Safety  Goal: Patient will be free from injury during hospitalization  Outcome: Progressing  Flowsheets (Taken 06/12/2020 1843)  Patient will be free from injury during hospitalization:   Assess patient's risk for falls and implement fall prevention plan of care per policy   Provide and maintain safe environment   Use appropriate transfer methods   Ensure appropriate safety devices are available at the bedside   Include patient/ family/ care giver in decisions related to safety   Hourly rounding  Goal: Patient will be free from infection during hospitalization  Outcome: Progressing  Flowsheets (Taken 06/12/2020 1843)  Free from Infection during hospitalization:   Assess and monitor for signs and symptoms of infection   Monitor lab/diagnostic results   Monitor all insertion sites (i.e. indwelling lines, tubes, urinary catheters, and drains)   Encourage patient and family to use good hand hygiene technique     Problem: Pain  Goal: Pain at adequate level as identified by patient  Outcome:  Progressing  Flowsheets (Taken 06/12/2020 1843)  Pain at adequate level as identified by patient:   Assess for risk of opioid induced respiratory depression, including snoring/sleep apnea. Alert healthcare team of risk factors identified.   Assess pain on admission, during daily assessment and/or before any "as needed" intervention(s)   Identify patient comfort function goal   Reassess pain within 30-60 minutes of any procedure/intervention, per Pain Assessment, Intervention, Reassessment (AIR) Cycle   Offer non-pharmacological pain management interventions     Problem: Side Effects from Pain Analgesia  Goal: Patient will experience minimal side effects of analgesic therapy  Outcome: Progressing     Problem: Discharge Barriers  Goal: Patient will be discharged home or other facility with appropriate resources  Outcome: Progressing     Problem: Psychosocial and Spiritual Needs  Goal: Demonstrates ability to cope with hospitalization/illness  Outcome: Progressing     Problem: Altered GI Function  Goal: Fluid and electrolyte balance are achieved/maintained  Outcome: Progressing  Flowsheets (Taken 06/12/2020 1843)  Fluid and electrolyte balance are achieved/maintained:   Monitor intake and output every shift   Monitor/assess lab values and report abnormal values   Provide adequate hydration   Monitor daily weight   Assess for confusion/personality changes   Assess and reassess fluid and electrolyte status  Goal: Elimination patterns are normal or improving  Outcome: Progressing  Flowsheets (Taken 06/12/2020 1843)  Elimination patterns are normal or improving:   Report abnormal assessment to physician   Anticipate/assist with toileting needs   Assess for normal bowel sounds   Monitor for abdominal distension   Monitor for abdominal discomfort  Assess for signs and symptoms of bleeding.  Report signs of bleeding to physician   Assess for flatus

## 2020-06-12 NOTE — Plan of Care (Signed)
Pt had x1 emesis at change of shift. No emesis this shift. No c/o abd pain or discomfort. Pt lost his IV access this shift due to multiple showers. New IV placed and IVF is infusing. Pt had multiple requests of apple juice and was offered. Safety bundle in place. Will monitor.   Problem: Altered GI Function  Goal: Fluid and electrolyte balance are achieved/maintained  Outcome: Progressing  Flowsheets (Taken 06/12/2020 0634)  Fluid and electrolyte balance are achieved/maintained:   Monitor intake and output every shift   Provide adequate hydration   Monitor/assess lab values and report abnormal values   Assess and reassess fluid and electrolyte status   Monitor daily weight   Assess for confusion/personality changes   Observe for seizure activity and initiate seizure precautions if indicated   Monitor for muscle weakness   Observe for cardiac arrhythmias  Goal: Elimination patterns are normal or improving  Outcome: Progressing  Flowsheets (Taken 06/12/2020 3016)  Elimination patterns are normal or improving:   Report abnormal assessment to physician   Assess for normal bowel sounds   Anticipate/assist with toileting needs   Monitor for abdominal distension   Monitor for abdominal discomfort   Assess for signs and symptoms of bleeding.  Report signs of bleeding to physician   Assess for flatus   Administer medications to improve bowel evacuation as prescribed   Encourage /perform oral hygiene as appropriate  Goal: Nutritional intake is adequate  Outcome: Progressing  Flowsheets (Taken 06/12/2020 0634)  Nutritional intake is adequate:   Monitor daily weights   Encourage/perform oral hygiene as appropriate   Allow adequate time for meals   Assist patient with meals/food selection   Assess anorexia, appetite, and amount of meal/food tolerated   Include patient/patient care companion in decisions related to nutrition

## 2020-06-12 NOTE — Progress Notes (Signed)
Nutritional Support Services  Nutrition Assessment    Jaydyn Bozzo. 29 y.o. male   MRN: [id number]    Summary of Nutrition Recommendations:    1. Continue regular diet as tolerated. Please document %PO in flowsheet.     2. Provide 1 Ensure Clear PO, BID to optimize intake   Each Ensure Clear provides 200 kcal and 7 gm protein.    3. Daily weights - standing scale preferred, if feasible    4. Continue antiemetics         Sent message to RN, awaiting reply  -----------------------------------------------------------------------------------------------------------------                                                      Assessment Data:     Referral Source: RN screen  Reason for Referral: MST 2 (unsure weight loss)    Nutrition: Unable to complete nutrition assessment at this time d/t pt requesting to shower. Currently pacing around his room and reporting "a shower will help me calm down". Will attempt to obtain full nutrition hx at f/u, updated RN. Noted ongoing n/v, no charted PO intake per flowsheet. Weight history detailed below. Pt with history of malnutrition per chart review of previous RD notes, though unable to rule in at this time d/t lack of information and no NFPE. Will continue to monitor at high nutrition risk.     Learning Needs: None at this time d/t pt declining assessment    Hospital Admission: 29 yo male with history of alcohol and marijuana use presented 10/12 with n/v.     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    PSH: has a past surgical history that includes Appendectomy.     Orders Placed This Encounter   Procedures   . Diet regular     ANTHROPOMETRIC  Height: 170.2 cm (5'  7")  Weight: 59 kg (130 lb)  Weight Change: 0  IBW/kg (Calculated) Male: 67.29 kg  Body mass index is 20.36 kg/m.     Weight Monitoring Weight Weight Method   07/08/2019 65.772 kg Stated   07/09/2019 65.772 kg    07/13/2019 55.339 kg Stated   07/16/2019 56.79 kg    12/01/2019 63.504 kg Stated   12/03/2019 63.504 kg Stated   12/14/2019 58.968 kg Stated   04/19/2020 56.7 kg Stated   04/19/2020 56.7 kg Stated   04/25/2020 56.7 kg Actual   06/10/2020 58.968 kg Stated   06/12/2020 58.968 kg Bed Scale      Weight History Summary: Noted 10.3% weight loss x 11 months (not significant criteria for malnutrition), will monitor trend during admission    Physical Assessment: 10/14 - unable to complete full or visual NFPE d/t pt declining assessment and pacing around room  Edema: none per flowsheet  Skin: no wounds per flowsheet  GI function: abd soft/nontender, BS active, n/v, last BM 10/12 per flowsheet    ESTIMATED NEEDS    Total Daily Energy Needs: 1475 to 1770 kcal  Method for Calculating Energy Needs: 25 kcal - 30 kcal per kg  at 59 kg (Actual body weight)  Rationale: nonobese BMI, noncritical    Total Daily Protein Needs: 70.8 to 118 g  Method for Calculating Protein Needs: 1.2 g - 2 g per kg at 59 kg (Actual body weight)  Rationale: nonobese BMI, noncritical, GFR > 60    Total Daily Fluid Needs: 1475 to 1770 ml  Method for Calculating Fluid Needs: 1 ml per kcal energy = 1475 to 1770 kcal       Pertinent Medications: reglan; PRN zofran and ativan    IVF:    . dextrose 5% lactated ringers 100 mL/hr at 06/11/20 1229     Allergies   Allergen Reactions   . Pollen Extract      Pertinent labs: BG 102                                                              Nutrition Diagnosis      Inadequate Protein Intake related to altered GI function (n/v) as evidenced by no charted intake so far per flowsheet - new                                                               Intervention     Nutrition recommendation - Please refer to top of  note                                                             Monitoring/Evaluation     Goals: Pt will consume/tolerate > 75% of meals/supplements by f/u - new    Nutrition Risk Level: High (will follow up at least 2 times per week and PRN)       Lenor Derrick, RD, CNSC  Clinical Dietitian  Ext. [phone]

## 2020-06-13 LAB — BASIC METABOLIC PANEL
Anion Gap: 13 (ref 5.0–15.0)
BUN: 11 mg/dL (ref 9.0–28.0)
CO2: 21 mEq/L — ABNORMAL LOW (ref 22–29)
Calcium: 10.2 mg/dL (ref 8.5–10.5)
Chloride: 107 mEq/L (ref 100–111)
Creatinine: 1.3 mg/dL (ref 0.7–1.3)
Glucose: 105 mg/dL — ABNORMAL HIGH (ref 70–100)
Potassium: 4.8 mEq/L (ref 3.5–5.1)
Sodium: 141 mEq/L (ref 136–145)

## 2020-06-13 LAB — HEMOLYSIS INDEX: Hemolysis Index: 90 — ABNORMAL HIGH (ref 0–18)

## 2020-06-13 LAB — GFR: EGFR: >60

## 2020-06-13 MED ORDER — SCOPOLAMINE 1 MG/3DAYS TD PT72
1.0000 | MEDICATED_PATCH | TRANSDERMAL | Status: DC
Start: 2020-06-13 — End: 2020-06-14
  Administered 2020-06-13: 01:00:00 1 via TRANSDERMAL
  Filled 2020-06-13: qty 1

## 2020-06-13 MED ORDER — SODIUM CHLORIDE 0.9 % IV SOLN
25.0000 mg | Freq: Once | INTRAVENOUS | Status: AC
Start: 2020-06-13 — End: 2020-06-13
  Administered 2020-06-13: 01:00:00 25 mg via INTRAVENOUS
  Filled 2020-06-13: qty 1

## 2020-06-13 MED ORDER — METOCLOPRAMIDE HCL 5 MG/ML IJ SOLN
5.0000 mg | Freq: Four times a day (QID) | INTRAMUSCULAR | Status: AC
Start: 2020-06-13 — End: 2020-06-13
  Administered 2020-06-13 (×3): 5 mg via INTRAVENOUS
  Filled 2020-06-13 (×3): qty 2

## 2020-06-13 MED ORDER — SODIUM CHLORIDE 0.9 % IV SOLN
25.0000 mg | Freq: Four times a day (QID) | INTRAVENOUS | Status: DC | PRN
Start: 2020-06-13 — End: 2020-06-13

## 2020-06-13 NOTE — Plan of Care (Signed)
Problem: Moderate/High Fall Risk Score >5  Goal: Patient will remain free of falls  Outcome: Progressing  Flowsheets (Taken 06/13/2020 2239)  Moderate Risk (6-13): MOD-Remain with patient during toileting     Problem: Safety  Goal: Patient will be free from injury during hospitalization  Outcome: Progressing  Flowsheets (Taken 06/13/2020 2239)  Patient will be free from injury during hospitalization:   Assess patient's risk for falls and implement fall prevention plan of care per policy   Use appropriate transfer methods   Include patient/ family/ care giver in decisions related to safety   Hourly rounding  Goal: Patient will be free from infection during hospitalization  Outcome: Progressing  Flowsheets (Taken 06/13/2020 2239)  Free from Infection during hospitalization:   Assess and monitor for signs and symptoms of infection   Monitor lab/diagnostic results   Monitor all insertion sites (i.e. indwelling lines, tubes, urinary catheters, and drains)   Encourage patient and family to use good hand hygiene technique     Problem: Pain  Goal: Pain at adequate level as identified by patient  Outcome: Progressing  Flowsheets (Taken 06/13/2020 2239)  Pain at adequate level as identified by patient:   Identify patient comfort function goal   Assess pain on admission, during daily assessment and/or before any "as needed" intervention(s)   Reassess pain within 30-60 minutes of any procedure/intervention, per Pain Assessment, Intervention, Reassessment (AIR) Cycle   Evaluate if patient comfort function goal is met   Evaluate patient's satisfaction with pain management progress   Include patient/patient care companion in decisions related to pain management as needed     Problem: Psychosocial and Spiritual Needs  Goal: Demonstrates ability to cope with hospitalization/illness  Outcome: Progressing  Flowsheets (Taken 06/13/2020 2239)  Demonstrates ability to cope with hospitalizations/illness:   Encourage verbalization of  feelings/concerns/expectations   Provide quiet environment   Reinforce positive adaptation of new coping behaviors   Include patient/ patient care companion in decisions     Problem: Altered GI Function  Goal: Fluid and electrolyte balance are achieved/maintained  Outcome: Progressing  Flowsheets (Taken 06/13/2020 2239)  Fluid and electrolyte balance are achieved/maintained:   Monitor intake and output every shift   Provide adequate hydration   Monitor/assess lab values and report abnormal values   Assess and reassess fluid and electrolyte status  Goal: Elimination patterns are normal or improving  Outcome: Progressing  Flowsheets (Taken 06/13/2020 2239)  Elimination patterns are normal or improving:   Report abnormal assessment to physician   Anticipate/assist with toileting needs   Assess for normal bowel sounds   Administer treatments as ordered   Assess for flatus  Goal: Nutritional intake is adequate  Outcome: Progressing  Flowsheets (Taken 06/13/2020 2239)  Nutritional intake is adequate:   Assess anorexia, appetite, and amount of meal/food tolerated   Include patient/patient care companion in decisions related to nutrition

## 2020-06-13 NOTE — Progress Notes (Addendum)
HOSPITALIST PROGRESS NOTE  [facility]      Patient: Joel [name].  Date: 06/13/2020    LOS: 2 Days  Admission Date: 06/10/2020   MRN: [id number]  Attending: Teodora Medici, MD  MD, MPH                    Please contact via Secure Chat                    Spectralink: [phone]                    Pager: [phone]     ASSESSMENT/PLAN     [name]. is a 29 y.o. male admitted with Nausea & vomiting    Interval Summary:     Active Hospital Problems    Diagnosis    Hypokalemia    Hypocalcemia    Nausea & vomiting    Abdominal pain     Marijuana abuse    Alcohol use disorder, moderate, in early remission    Acute kidney injury    Dehydration       Patient Active Hospital Problem List:  Nausea & vomiting (06/11/2020)   Abdominal pain  (06/11/2020)   Dehydration (04/19/2020)   Acute kidney injury (04/20/2020)   Hypokalemia (06/11/2020)   Hypocalcemia (06/11/2020)   Hypernatremia     Assessment: Continues to have nausea and vomiting. Dehydration attributed to nausea and vomiting likely from cyclic vomiting given marijuana abuse; Initial labs abnormalities including elevated Na, low K and low Ca appear to be erroneous given unexpectedly swift resolution of the calcium and potassium levels; creatinine has stabilized at 1.3   Plan: Continue with D5 with lactated Ringer at 100 cc  pain control with morphine 1 mg IV every 4 hours as needed for severe pain and oxycodone 5 mg every 6 hours for moderate pain  Due to persistent nausea and vomiting, in addition to Zofran and Reglan started on as needed Phenergan  Patient has also been receiving IV Ativan for anxiety/restlessness and nausea and vomiting   Continue with Protonix 40 mg IV daily  reordered clear liquids      GI Prophylaxis  protonix IV     Analgesia: Morphine IV; oxycodone    Nutrition: Reordered clears again     DVT Prophylaxis: Lovenox    Code Status: Full code    DISPO:     10/14: Discharge currently on hold on account of persistent nausea  and vomiting and inability to tolerate liquids or solids.  Discussed with wife at the bedside    10/15: unable to tolerate any oral intake.        SUBJECTIVE     Joel [name]. states that he continues to have recurrent episodes of nausea and vomiting.  He threw up solid foods that he had earlier today.  Admits to having some abdominal pain as well    Febrile- None   Eating- not tolerating oral intake   Coughing- None   Diarrhea- None   Sleeping - Well   Voiding - Well        MEDICATIONS     Current Facility-Administered Medications   Medication Dose Route Frequency    enoxaparin  40 mg Subcutaneous Daily    metoclopramide  5 mg Intravenous 4 times per day    scopolamine  1 patch Transdermal Q72H    senna-docusate  1 tablet Oral QHS  PHYSICAL EXAM     Vitals:    06/13/20 0738   BP: 143/84   Pulse: 68   Resp: 18   Temp: 100 F (37.8 C)   SpO2: 99%       Temperature: Temp  Min: 97.4 F (36.3 C)  Max: 100 F (37.8 C)  Pulse: Pulse  Min: 60  Max: 92  Respiratory: Resp  Min: 14  Max: 18  Non-Invasive BP: BP  Min: 97/50  Max: 167/98  Pulse Oximetry SpO2  Min: 97 %  Max: 100 %    Intake and Output Summary (Last 24 hours) at Date Time    Intake/Output Summary (Last 24 hours) at 06/13/2020 1124  Last data filed at 06/13/2020 0300  Gross per 24 hour   Intake    Output 400 ml   Net -400 ml       Physical Exam  Constitutional:       General: He is not in acute distress.     Appearance: He is not diaphoretic.   HENT:      Head: Normocephalic and atraumatic.      Right Ear: External ear normal.      Left Ear: External ear normal.   Eyes:      Pupils: Pupils are equal, round, and reactive to light.   Neck:      Thyroid: No thyromegaly.      Vascular: No JVD.      Trachea: No tracheal deviation.   Cardiovascular:      Rate and Rhythm: Normal rate and regular rhythm.      Heart sounds: Normal heart sounds. No murmur heard.   No friction rub. No gallop.    Pulmonary:      Effort: Pulmonary effort is normal. No  respiratory distress.      Breath sounds: Normal breath sounds. No stridor. No wheezing or rales.   Chest:      Chest wall: No tenderness.   Abdominal:      General: Bowel sounds are normal. There is no distension.      Palpations: Abdomen is soft. There is no mass.      Tenderness: There is abdominal tenderness (diffuse ). There is no guarding or rebound.   Musculoskeletal:         General: No tenderness or deformity. Normal range of motion.      Cervical back: Normal range of motion and neck supple.   Lymphadenopathy:      Cervical: No cervical adenopathy.   Skin:     General: Skin is warm and dry.      Coloration: Skin is not pale.      Findings: No erythema or rash.   Neurological:      Mental Status: He is alert and oriented to person, place, and time.      Cranial Nerves: No cranial nerve deficit.      Motor: No abnormal muscle tone.      Coordination: Coordination normal.      Gait: Gait is intact.      Deep Tendon Reflexes: Reflexes are normal and symmetric.   Psychiatric:         Mood and Affect: Mood and affect normal.         Cognition and Memory: Memory normal.         Judgment: Judgment normal.             LABS     Recent Labs   Lab 06/12/20  5638 06/11/20  0846 06/10/20  2253   WBC 14.90* 22.24* 26.89*   RBC 4.58 4.79 5.93*   Hgb 14.0 14.6 18.3*   Hematocrit 44.0 43.7 53.8*   MCV 96.1* 91.2 90.7   Platelets 157 262 325       Recent Labs   Lab 06/13/20  0436 06/12/20  0334 06/11/20  0846 06/10/20  2323   Sodium 141 139 141 148*   Potassium 4.8 4.3 4.9 1.6*   Chloride 107 104 104 129*   CO2 21* 25 20* 10*   BUN 11.0 16.0 19.0 8.0*   Creatinine 1.3 1.3 1.7* 0.7   Glucose 105* 102* 96 63*   Calcium 10.2 10.0 10.8* 4.8*   Magnesium  --   --  3.1* 1.0*       Recent Labs   Lab 06/11/20  0846 06/10/20  2323   ALT 14 7   AST (SGOT) 23 9   Bilirubin, Total 0.9 0.3   Albumin 4.6 2.2*   Alkaline Phosphatase 60 25*                   Microbiology Results (last 15 days)       Procedure Component Value Units Date/Time     COVID-19 (SARS-COV-2) [facility] Rapid) [[id number]] Collected: 06/11/20 0223    Order Status: Completed Specimen: Nasopharyngeal Swab from Nasopharynx Updated: 06/11/20 0250     Purpose of COVID testing Screening     SARS-CoV-2 Specimen Source Nasopharyngeal     SARS CoV 2 Overall Result Negative     Comment: Test performed using the Abbott ID NOW EUA assay.  Please see Fact Sheets for patients and providers located at:  http://www.rice.biz/    This test is for the qualitative detection of SARS-CoV-2  (COVID19) nucleic acid. Viral nucleic acids may persist in vivo,  independent of viability. Detection of viral nucleic acid does  not imply the presence of infectious virus, or that virus  nucleic acid is the cause of clinical symptoms. Negative  results should be treated as presumptive and, if inconsistent  with clinical signs and symptoms or necessary for patient  management, should be tested with an alternative molecular  assay. Negative results do not preclude SARS-CoV-2 infection  and should not be used as the sole basis for patient  management decisions. Invalid results may be due to inhibiting  substances in the specimen and recollection should occur.         Narrative:      o Collect and clearly label specimen type:  o Upper respiratory specimen: One Nasopharyngeal Dry Swab NO  Transport Media.  o Hand deliver to laboratory ASAP  Indication for testing->Extended care facility admission to  semi private room  Screening             CT Abd/Pelvis with IV Contrast only    Result Date: 06/11/2020  Mild gastritis morphology in the appropriate clinical setting There are other chronic/incidental findings as described.  Candie Chroman, MD  06/11/2020 2:18 AM      Echo Results       None            I have personally reviewed the patient's labs, medications, and imaging    Total visit time = 25 mins ; more than 50% spent counseling/coordinating care    Signed,    Teodora Medici MD, MPH  11:24 AM 06/13/2020    Please contact via Secure Chat   SpectraLink: [phone]  Pager: [phone]  This chart was generated using a medical voice-recognition software which does not employ spell-checking or grammar-checking features. It was dictated, all or in part, in a busy and often noisy patient care environment. I have taken all usual measures to dictate carefully and to review all aspects of this chart. Nonetheless, given the known and well-documented performance characteristics of voice recognition software in such patient care environments, this dictation still may contain unrecognized and wholly unintended errors or omissions.

## 2020-06-13 NOTE — Plan of Care (Signed)
Problem: Altered GI Function  Goal: Fluid and electrolyte balance are achieved/maintained  Outcome: Progressing  Flowsheets (Taken 06/12/2020 1843 by Hollace Kinnier, RN)  Fluid and electrolyte balance are achieved/maintained:   Monitor intake and output every shift   Monitor/assess lab values and report abnormal values   Provide adequate hydration   Monitor daily weight   Assess for confusion/personality changes   Assess and reassess fluid and electrolyte status  Goal: Elimination patterns are normal or improving  Outcome: Progressing  Flowsheets (Taken 06/12/2020 1843 by Hollace Kinnier, RN)  Elimination patterns are normal or improving:   Report abnormal assessment to physician   Anticipate/assist with toileting needs   Assess for normal bowel sounds   Monitor for abdominal distension   Monitor for abdominal discomfort   Assess for signs and symptoms of bleeding.  Report signs of bleeding to physician   Assess for flatus  Goal: Nutritional intake is adequate  Outcome: Progressing  Flowsheets (Taken 06/12/2020 1843 by Hollace Kinnier, RN)  Nutritional intake is adequate:   Monitor daily weights   Assist patient with meals/food selection   Allow adequate time for meals   Encourage/perform oral hygiene as appropriate   Encourage/administer dietary supplements as ordered (i.e. tube feed, TPN, oral, OGT/NGT, supplements)   Include patient/patient care companion in decisions related to nutrition

## 2020-06-14 DIAGNOSIS — R4689 Other symptoms and signs involving appearance and behavior: Secondary | ICD-10-CM | POA: Diagnosis not present

## 2020-06-14 DIAGNOSIS — F1290 Cannabis use, unspecified, uncomplicated: Secondary | ICD-10-CM

## 2020-06-14 DIAGNOSIS — F32A Depression, unspecified: Secondary | ICD-10-CM

## 2020-06-14 LAB — BASIC METABOLIC PANEL
Anion Gap: 8 (ref 5.0–15.0)
BUN: 11 mg/dL (ref 9.0–28.0)
CO2: 27 mEq/L (ref 22–29)
Calcium: 9.5 mg/dL (ref 8.5–10.5)
Chloride: 103 mEq/L (ref 100–111)
Creatinine: 1.1 mg/dL (ref 0.7–1.3)
Glucose: 98 mg/dL (ref 70–100)
Potassium: 3.6 mEq/L (ref 3.5–5.1)
Sodium: 138 mEq/L (ref 136–145)

## 2020-06-14 LAB — CBC
Absolute NRBC: 0 10*3/uL (ref 0.00–0.00)
Hematocrit: 38.1 % (ref 37.6–49.6)
Hgb: 12.8 g/dL (ref 12.5–17.1)
MCH: 30.8 pg (ref 25.1–33.5)
MCHC: 33.6 g/dL (ref 31.5–35.8)
MCV: 91.6 fL (ref 78.0–96.0)
MPV: 11.2 fL (ref 8.9–12.5)
Nucleated RBC: 0 /100 WBC (ref 0.0–0.0)
Platelets: 201 10*3/uL (ref 142–346)
RBC: 4.16 10*6/uL — ABNORMAL LOW (ref 4.20–5.90)
RDW: 15 % (ref 11–15)
WBC: 10.89 10*3/uL — ABNORMAL HIGH (ref 3.10–9.50)

## 2020-06-14 LAB — GFR: EGFR: >60

## 2020-06-14 LAB — HEMOLYSIS INDEX: Hemolysis Index: 9 (ref 0–18)

## 2020-06-14 MED ORDER — ONDANSETRON 4 MG PO TBDP
4.0000 mg | ORAL_TABLET | Freq: Four times a day (QID) | ORAL | 0 refills | Status: DC | PRN
Start: 2020-06-14 — End: 2021-04-26

## 2020-06-14 MED ORDER — FAMOTIDINE 20 MG PO TABS
ORAL_TABLET | ORAL | 0 refills | Status: DC
Start: 2020-06-14 — End: 2021-04-26

## 2020-06-14 NOTE — Progress Notes (Signed)
Pt is discharged home with personal belongings. Discharged instructions given and verbalizes understanding. Pt sated he can walk by himself downstairs.

## 2020-06-14 NOTE — Discharge Instructions (Signed)
Understanding Marijuana Use Problems  You may think marijuana is harmless. But it can cause many health problems. And it may affect how well you learn, think, and remember. If you or a loved one has a problem with marijuana, tell someone you trust. That is the first step in getting help.   What is marijuana?  Marijuana is a mix of dried flowers and leaves from the hemp plant. Sinsemilla, hashish, and hash oil are stronger forms. Most users smoke marijuana. But sometimes its made into tea or added to food. The most active part of marijuana is a chemical called THC. How strong the drug is depends on the amount of THC. This article does not include information about synthetic marijuana products. These have different properties.   What are its effects?  How marijuana affects you depends on many factors. These include the strength of the drug, the setting, and your feelings about it. Some people may use marijuana and feel nothing. Others might feel relaxed and giggly. Time might seem to move slowly. And sights, sounds, and colors may seem more intense.   What are the risks?  Marijuana has many of the same toxic chemicals as tobacco. But you may breathe in even more of them. This is especially true since marijuana users breathe in deeply. And they hold the smoke in for a longer time. So long-term use may cause breathing problems. It may also lead to chest colds and diseases such as pneumonia. Marijuana weakens your immune system. This makes you more likely to get sick. Heavy marijuana use may make it harder to focus your mind. You may not learn as quickly. And you may forget what youve learned. You may develop mental problems.Some users may also develop a strong physical and emotional need for the drug (dependence or addiction). Some long-term users also develop a syndrome that causes uncontrollable vomiting and belly pain after they smoke.   How is a marijuana use problem treated?   Most often, treatment includes  therapy and support systems. Newer programs may focus on helping you stick with treatment. Your healthcare provider can help you learn more. Check online for mental health clinics or drug treatment programs. Its not always easy to stop using marijuana. Many of your friends may still use the drug. Some family members may even smoke it. But if you want to quit, you can succeed.   To learn more  Substance Abuseand Mental Health ServicesHelpline, 800-662-HELP 3854040925)   NationalInstitute on Drug Abuse, FunnyVisions.tn   Marijuana Anonymous [employer], www.marijuana-anonymous.org    StayWell last reviewed this educational content on 09/30/2018   2000-2021 [employer], [place]. All rights reserved. This information is not intended as a substitute for professional medical care. Always follow your healthcare professional's instructions.      Cannabinoid Hyperemesis Syndrome  What is cannabinoid hyperemesis syndrome?  Cannabinoid hyperemesis syndrome (CHS) is a condition that leads to repeated and severe bouts of vomiting. It is rare and only occurs in daily long-term users of marijuana.   Marijuana has several active substances. These include THC and related chemicals. These substances bind to molecules found in the brain. That causes the drug high and other effects that users feel.   Your digestive tract also has a number of molecules that bind to [facility] and related substances. So marijuana also affects the digestive tract. For example, the drug can change the time it takes the stomach to empty. It also affects the esophageal sphincter. Thats the tight band of  muscle that opens and closes to let food from the esophagus into the stomach. Long-term marijuana use can change the way the affected molecules respond and lead to the symptoms of CHS.   Marijuana is the most widely used illegal drug in the [place] Young adults are the most frequent users. A small number of these people develop CHS.  It often only happens in people who have regularly used marijuana for several years. Often CHS affects those who use the drug at least once a day.   What causes cannabinoid hyperemesis syndrome?  Marijuana has very complex effects on the body. Experts are still trying to learn exactly how it causes CHS in some people.   In the brain, marijuana often has the opposite effect of CHS. It helps prevent nausea and vomiting. The drug is also good at stopping such symptoms in people having chemotherapy.   But in the digestive tract, marijuana seems to have the opposite effect. It actually makes you more likely to have nausea and vomiting. With the first use of marijuana, the signals from the brain may be more important. That may lead to anti-nausea effects at first. But with repeated use of marijuana, certain receptors in the brain may stop responding to the drug in the same way. That may cause the repeated bouts of vomiting found in people with CHS.   It still isnt clear why some heavy marijuana users get the syndrome, but others don't.   What are the symptoms of cannabinoid hyperemesis syndrome?  People with CHS suffer from repeated bouts of vomiting. In between these episodes are times without any symptoms. Healthcare providers often divide these symptoms into 3 stages: the prodromal phase, the hyperemetic phase, and the recovery phase.   Prodromal phase.  During this phase, the main symptoms are often early morning nausea and belly (abdominal) pain. Some people also develop a fear of vomiting. Most people keep normal eating patterns during this time. Some people use more marijuana because they think it will help stop the nausea. This phase may last for months or years.   Hyperemetic phase.  Symptoms during this time may include:    Ongoing nausea   Repeated episodes of vomiting   Belly pain   Decreased food intake and weight loss   Symptoms of fluid loss (dehydration)  During this phase, vomiting is often intense  and overwhelming. Many people take a lot of hot showers during the day. They find that doing so eases their nausea. (That may be because of how the hot temperature affects a part of the brain called the hypothalamus. This part of the brain effects both temperature regulation and vomiting.) People often first seek medical care during this phase.   The hyperemetic phase may continue until the person completely stops using marijuana. Then the recovery phrase starts.   Recovery phase.  During this time, symptoms go away. Normal eating is possible again. This phase can last days or months. Symptoms often come back if the person tries marijuana again.   How is cannabinoid hyperemesis syndrome diagnosed?  Many health problems can cause repeated vomiting. To make a diagnosis, your healthcare provider will ask you about your symptoms and your past health. He or she will also do a physical exam, including an exam of your belly.   Your healthcare provider may also need more tests to rule out other causes of the vomiting. Thats especially the case for ones that may signal a health emergency. Based on your other symptoms,  these tests might include:    Blood tests for anemia and infection   Tests for electrolytes   Tests for pancreas and liver enzymes, to check these organs   Pregnancy test   Urine analysis, to test for infection or other urinary causes   Drug screen, to test for drug-related causes of vomiting   X-rays of the belly, to check for things such as a blockage   Upper endoscopy, to view the stomach and esophagus for possible causes of vomiting   Head CT scan, if a nervous system cause of vomiting seems likely   Abdominal CT scan, to check for health problems that might need surgery  CHS was only recently discovered. So some healthcare providers may not know about it. As a result, they may not spot it for many years. They often confuse CHS with cyclical vomiting disorder. That is a health problem that causes  similar symptoms. A specialist trained in diseases of the digestive tract (gastroenterologist) might make the diagnosis.   You may have CHS if you have all of these:    Long-term weekly and daily marijuana use   Belly pain   Severe, repeated nausea and vomiting   You feel better after taking a hot shower  There is no single test that confirms this diagnosis. Only improvement after quitting marijuana confirms the diagnosis.   How is cannabinoid hyperemesis syndrome treated?  If you have had severe vomiting, you might need to stay in the hospital for a short time. During the hyperemesis phase, you might need these treatments:    IV (intravenous) fluid replacement for dehydration   Medicines to help decrease vomiting   Pain medicine   Proton-pump inhibitors, to treat stomach inflammation   Frequent hot showers   Prescribed medicines that help calm you down (benzodiazepines)   In a small sample of people with CHS, rubbing capsaicin cream on the belly helped decrease pain and nausea. The chemicals in the cream have the same effect as a hot shower  Symptoms often ease after a day or 2 unless marijuana is used before this time.   To fully get better, you need to stop using marijuana all together. Some people may get help from drug rehab programs to help them quit. Cognitive behavioral therapy or family therapy can also help. If you stop using marijuana, your symptoms should not come back.   What are possible complications of cannabinoid hyperemesis syndrome?   Very severe, prolonged vomiting may lead to dehydration. It may also lead to electrolyte problems in your blood. If untreated, these can cause rare complications such as:    Muscle spasms or weakness   Seizures   Kidney failure   Heart rhythm abnormalities   Shock   In very rare cases, brain swelling (cerebral edema)  Your healthcare team will quickly work to fix any dehydration or electrolyte problems. Doing so can help prevent these problems.    What can I do to prevent cannabinoid hyperemesis syndrome?  You can prevent CHS by not using marijuana in any form. You may not want to believe that marijuana may be the underlying cause of your symptoms. That may be because you have used it for many years without having any problems. The syndrome may take several years to develop. The drug may help prevent nausea in new users who dont use it often. But people with CHS need to completely stop using it. If they dont, their symptoms will likely come back.   Quitting marijuana  may lead to other health benefits, such as:    Better lung function   Improved memory and thinking skills   Better sleep   Decreased risk for depression and anxiety  When should I call my healthcare provider?  Call your healthcare provider if you have had severe vomiting for a day or more.  Key points about cannabinoid hyperemesis syndrome   CHS is a condition that leads to repeated and severe bouts of vomiting. It results from long-term use of marijuana.   Most people self-treat using hot showers to help reduce their symptoms.   Some people with CHS may not be diagnosed for several years. Admitting to your healthcare provider that you use marijuana daily can speed up the diagnosis.   You might need to stay in the hospital to treat dehydration from [facility].   Symptoms start to go away within a day or 2 after stopping marijuana use.   Symptoms almost always come back if you use marijuana again.    Next steps  Tips to help you get the most from a visit to your healthcare provider:   Know the reason for your visit and what you want to happen.   Before your visit, write down questions you want answered.   Bring someone with you to help you ask questions and remember what your provider tells you.   At the visit, write down the name of a new diagnosis, and any new medicines, treatments, or tests. Also write down any new instructions your provider gives you.   Know why a new medicine or  treatment is prescribed, and how it will help you. Also know what the side effects are.   Ask if your condition can be treated in other ways.   Know why a test or procedure is recommended and what the results could mean.   Know what to expect if you do not take the medicine or have the test or procedure.   If you have a follow-up appointment, write down the date, time, and purpose for that visit.   Know how you can contact your provider if you have questions.  StayWell last reviewed this educational content on 05/31/2019   2000-2021 [employer], [place]. All rights reserved. This information is not intended as a substitute for professional medical care. Always follow your healthcare professional's instructions.

## 2020-06-14 NOTE — Plan of Care (Signed)
Pt alert and oriented. Tolerated meds as ordered. Ambulate in bathroom by self Encouraged to call for help when needed.    Problem: Safety  Goal: Patient will be free from injury during hospitalization  Flowsheets (Taken 06/14/2020 0948)  Patient will be free from injury during hospitalization:   Use appropriate transfer methods   Assess patient's risk for falls and implement fall prevention plan of care per policy   Provide and maintain safe environment   Ensure appropriate safety devices are available at the bedside   Hourly rounding   Include patient/ family/ care giver in decisions related to safety   Assess for patients risk for elopement and implement Elopement Risk Plan per policy     Problem: Discharge Barriers  Goal: Patient will be discharged home or other facility with appropriate resources  Flowsheets (Taken 06/14/2020 0949)  Discharge to home or other facility with appropriate resources:   Initiate discharge planning   Provide appropriate patient education   Provide information on available health resources     Problem: Psychosocial and Spiritual Needs  Goal: Demonstrates ability to cope with hospitalization/illness  Flowsheets (Taken 06/14/2020 0949)  Demonstrates ability to cope with hospitalizations/illness:   Encourage participation in diversional activity   Assist patient to identify own strengths and abilities   Encourage verbalization of feelings/concerns/expectations   Provide quiet environment   Encourage patient to set small goals for self   Reinforce positive adaptation of new coping behaviors   Include patient/ patient care companion in decisions     Problem: Altered GI Function  Goal: Fluid and electrolyte balance are achieved/maintained  Flowsheets (Taken 06/14/2020 0949)  Fluid and electrolyte balance are achieved/maintained:   Monitor/assess lab values and report abnormal values   Monitor intake and output every shift   Provide adequate hydration   Assess for  confusion/personality changes   Monitor daily weight   Assess and reassess fluid and electrolyte status   Observe for seizure activity and initiate seizure precautions if indicated

## 2020-06-14 NOTE — Plan of Care (Signed)
Pt aox4, not anxious this shift. No PRN given. No N/V this shift. Continue IVF, no c/o pain. Continue to monitor,     Problem: Altered GI Function  Goal: Fluid and electrolyte balance are achieved/maintained  Outcome: Progressing  Flowsheets (Taken 06/13/2020 2239 by Hartford Poli, RN)  Fluid and electrolyte balance are achieved/maintained:   Monitor intake and output every shift   Provide adequate hydration   Monitor/assess lab values and report abnormal values   Assess and reassess fluid and electrolyte status  Goal: Elimination patterns are normal or improving  Outcome: Progressing  Flowsheets (Taken 06/13/2020 2239 by Hartford Poli, RN)  Elimination patterns are normal or improving:   Report abnormal assessment to physician   Anticipate/assist with toileting needs   Assess for normal bowel sounds   Administer treatments as ordered   Assess for flatus  Goal: Nutritional intake is adequate  Outcome: Progressing  Flowsheets (Taken 06/13/2020 2239 by Hartford Poli, RN)  Nutritional intake is adequate:   Assess anorexia, appetite, and amount of meal/food tolerated   Include patient/patient care companion in decisions related to nutrition

## 2020-06-14 NOTE — Discharge Instr - Activity (Signed)
As tolerated

## 2020-06-14 NOTE — Discharge Instr - Diet (Signed)
Clear liquid

## 2020-06-14 NOTE — Discharge Instr - AVS First Page (Addendum)
[facility] DISCHARGE INSTRUCTIONS     Date of Admission: 06/10/2020    Date of Discharge: 06/14/2020    Consultants: None    Pending Studies: None     Further Instructions: Please follow up with the providers/clinics listed below.     Surgery/Procedure: CT Abd/Pelvis with IV Contrast only    Result Date: 06/11/2020  Mild gastritis morphology in the appropriate clinical setting There are other chronic/incidental findings as described.  Candie Chroman, MD  06/11/2020 2:18 AM     Admission Diagnosis: Hypocalcemia [E83.51]  Hypokalemia [E87.6]  Hypernatremia [E87.0]  Generalized abdominal pain [R10.84]  Intractable vomiting with nausea, unspecified vomiting type [R11.2]    Problem List  Active Hospital Problems    Diagnosis    Aggressive behavior    Hypokalemia    Hypocalcemia    Nausea & vomiting    Abdominal pain     Marijuana abuse    Alcohol use disorder, moderate, in early remission    Acute kidney injury    Dehydration          Discharge Medications     Medication List        START taking these medications      famotidine 20 MG tablet  Commonly known as: PEPCID  Use twice daily for 7 days and then twice daily only as needed     ondansetron 4 MG disintegrating tablet  Commonly known as: ZOFRAN-ODT  Take 1 tablet (4 mg total) by mouth every 6 (six) hours as needed for Nausea            CONTINUE taking these medications      capsaicin 0.025 % cream  Commonly known as: ZOSTRIX  Apply topically 3 (three) times daily     sertraline 25 MG tablet  Commonly known as: ZOLOFT     traZODone 50 MG tablet  Commonly known as: DESYREL  Take 1 tablet (50 mg total) by mouth nightly               Where to Get Your Medications        These medications were sent to [id number] - [place], [place] - [phone] AT [facility]  [address] [place] [address]      Phone: [phone]   famotidine 20 MG tablet  ondansetron 4 MG disintegrating tablet         Follow up  Appointments  Follow-up Information       [facility] [place] Follow up.    Specialty: Family Medicine  Why: Primary Care Clinic   Contact information:  [address]  [address]  [place] [address]  [phone]  Additional information:  From [address] - take [address]. Go west on [address] Go through the intersection with [address]. Destination is on your left.      From [address] - go east on [address] past [employer]. Destination is on your right.             [address] [place] Follow up.    Specialty: Addiction Medicine  Why: Marijuana/Nicotine  use counseling   Contact information:  [address]  [place] [address]  [phone]  Additional information:  Take the [address] ([address]) to the [employer] or [employer] exits. Go East to the [employer].  Call Your Doctor if you have:   Chest pain   Shortness of breath or difficulty breathing   Temperature greater than 100.4   Persistent nausea and vomiting   Severe uncontrolled pain   Signs of infection (pain, swelling, redness, tenderness, odor, or green/yellow discharge)   Headache or visual disturbances   Hives   Persistent dizziness or light-headedness   Extreme fatigue   Bleeding   Any other questions or concerns you may have after discharge   In an emergency, call 911 or go to an Emergency Department at a nearby hospital     Additional Instructions:   -Schedule a follow up appointment with your Primary Care Provider within 1 week of discharge.   -Carry a list of your medications and allergies with you at all times   -Resume your usual diet unless otherwise directed. Good nutrition promotes quicker healing.   -Call your pharmacy at least 1 week in advance to refill prescriptions     Information on Taking Medicine Safely   Medicine is given to help treat or prevent illness. But if you dont take it correctly, it might not help. It might even harm  you. Your doctor or pharmacist can help you learn the right way to take your medicine. Listed below are some tips to help you take medicine safely.     Safety Tips   1. Have a routine for taking each medicine. Make it part of something you do each day, such as brushing your teeth or eating a meal.   2. When you go to the hospital or your doctors office, bring all your current medicines in their original boxes or bottles. If you cant do that, bring an up-to-date list of your medicines.   3. Do not stop taking a prescription medicine unless your doctor tells you to. Doing so could make your condition worse.   4. Do not share medicines.   5. Let your doctor and pharmacist know of any allergies you have.   6. Taking prescription medicines with alcohol, street drugs, herbs, supplements, or even some over-the-counter medicines can be harmful. Talk to your doctor or pharmacist before using any of these things while taking a prescription medcine.   7. When filling your prescriptions, try using the same pharmacy for all your medicines. If not, let the pharmacist know what medicines you are already taking.   8. Keep medicines out of the reach of children and pets. Store medicines in a cool, dry, dark place--not in the bathroom or in the kitchen near moisture or heat.   9. Do not use medicine that has expired or that doesnt look or smell right. Bring expired or old medicine to your pharmacy for disposal.     Using Generic Medicines   Medicines have brand names and generic (chemical) names. When a medicine is first made, it is sold only under its brand name. Later, it can be made and sold as a generic. Generic medicines cost less than brand-name medicines and most work just as well. Unless their doctor says otherwise, most people can use the generic medicine instead of the brand-name medicine.     Always follow your healthcare professional's instructions.     If you are unable to obtain an appointment, unable to obtain newly  prescribed medications, or are unclear about any of your discharge instructions please contact me at [phone] (M-F, 8am-3pm) or weekends and after hours via the hospital operator [phone], the hospital case manager, or your  primary care physician.    Finally, as your discharging physician, you may be receiving a survey which is regarding my care. I would greatly value and appreciate your feedback as I strive for excellence.     Respectfully yours,    Murlean Hark MD, MPH  [facility]  [facility]

## 2020-06-14 NOTE — Discharge Summary (Signed)
[facility]      Patient: Joel [name].  Admission Date: 06/10/2020   DOB: August 06, 1991  Discharge Date: 06/14/2020    MRN: [id number]  Discharge Attending:Arvie Bartholomew Gaynelle Adu, MD   Referring Physician: Pcp, None, MD  PCP: Pcp, None, MD       DISCHARGE SUMMARY     Discharge Information   Admission Diagnosis:   Nausea & vomiting    Discharge Diagnosis:   Active Hospital Problems    Diagnosis    Aggressive behavior    Hypokalemia    Hypocalcemia    Nausea & vomiting    Abdominal pain     Marijuana abuse    Alcohol use disorder, moderate, in early remission    Acute kidney injury    Dehydration        Admission Condition: nausea and vomiting   Discharge Condition: improved  Consultants: none  Functional Status: ambulatory  Discharged to: home    Discharge Medications:     Medication List      START taking these medications    famotidine 20 MG tablet  Commonly known as: PEPCID  Use twice daily for 7 days and then twice daily only as needed     ondansetron 4 MG disintegrating tablet  Commonly known as: ZOFRAN-ODT  Take 1 tablet (4 mg total) by mouth every 6 (six) hours as needed for Nausea        CONTINUE taking these medications    capsaicin 0.025 % cream  Commonly known as: ZOSTRIX  Apply topically 3 (three) times daily     sertraline 25 MG tablet  Commonly known as: ZOLOFT     traZODone 50 MG tablet  Commonly known as: DESYREL  Take 1 tablet (50 mg total) by mouth nightly           Where to Get Your Medications      These medications were sent to [id number] - [place], [place] - [phone] AT [facility]  [address] [place] [address]    Phone: [phone]    famotidine 20 MG tablet   ondansetron 4 MG disintegrating tablet              Hospital Course   Presentation History   Per H&P "Joel [name]. is a 29 y.o. male with a PMHx of alcohol use disorder and marijuana abuse who presented with nausea and vomiting for 1 day.  Based on  notes from the ER  patient reported that he had too many episodes to count.  He had generalized abdominal pain but developed subsequent to the numerous episodes of vomiting.  Patient denied recent alcohol marijuana use.    Patient was unable to provide any history as he was too somnolent at the time of my visit this morning."    See HPI for details.    Hospital Course (3 Days)     Active Hospital Problems    Diagnosis    Aggressive behavior    Anxiety and depression    Cannabinoid hyperemesis syndrome    Nausea & vomiting    Abdominal pain     Marijuana abuse    Alcohol use disorder, moderate, in early remission    Acute kidney injury    Dehydration     Patient Active Hospital Problem List:  Nausea &vomiting (06/11/2020)  Abdominal pain (06/11/2020)  Dehydration (04/19/2020)  Acute kidney injury (04/20/2020)   Marijuana use disorder   Current every day  smoking   Assessment: Continues to have nausea and vomiting. Dehydration attributed to nausea and vomiting likely from cyclic vomiting given marijuana abuse;Initial labs abnormalities including elevated Na, low K and low Ca appear to be erroneous given unexpectedly swift resolution of the calcium and potassium levels; creatinine has stabilized at 1.3   Plan: strongly advised to stop marijuana use; referral to CATS program given   Rx given for zofran and pepcid  Gradually advance diet     Anxiety and depression   Assessment- patient continued to have several episodes of aggressive behavior and restlessness during this hospital stay. Concern for substance use disorder induced depression and anxiety   Plan- resume sertraline and trazodone   Follow up with primary behavioral health clinic     Code Status at Discharge: FULL       Best Practices   Was the patient admitted with either a CHF Exacerbation or Pneumonia? None     Progress Note/Physical Exam at Discharge     Subjective:  Reports feeling fine. Does not want to eat anything as he does not like the  food here. Denies any nausea or vomiting overnight.     Vitals:    06/14/20 0345 06/14/20 0744 06/14/20 1116 06/14/20 1117   BP: 125/77 121/77 129/79    Pulse: 60 67 61 61   Resp:  16  16   Temp: 98.4 F (36.9 C) 98.6 F (37 C) 97.7 F (36.5 C)    TempSrc: Oral Oral Oral    SpO2: 99% 100% 100% 100%   Weight:       Height:           Physical Exam  Vitals and nursing note reviewed.   Constitutional:       General: He is not in acute distress.     Appearance: Normal appearance. He is normal weight. He is not ill-appearing, toxic-appearing or diaphoretic.   HENT:      Head: Normocephalic and atraumatic.      Right Ear: Tympanic membrane, ear canal and external ear normal.      Left Ear: Tympanic membrane, ear canal and external ear normal.      Nose: Nose normal. No congestion or rhinorrhea.      Mouth/Throat:      Mouth: Mucous membranes are moist.      Pharynx: Oropharynx is clear. No oropharyngeal exudate or posterior oropharyngeal erythema.   Eyes:      General: No scleral icterus.        Right eye: No discharge.         Left eye: No discharge.      Extraocular Movements: Extraocular movements intact.      Conjunctiva/sclera: Conjunctivae normal.      Pupils: Pupils are equal, round, and reactive to light.   Cardiovascular:      Rate and Rhythm: Normal rate and regular rhythm.      Pulses: Normal pulses.      Heart sounds: Normal heart sounds. No murmur heard.   No friction rub. No gallop.    Pulmonary:      Effort: Pulmonary effort is normal. No respiratory distress.      Breath sounds: No stridor. No rhonchi or rales.   Chest:      Chest wall: No tenderness.   Abdominal:      General: Abdomen is flat. Bowel sounds are normal. There is no distension.      Palpations: Abdomen is soft. There is no mass.  Tenderness: There is no abdominal tenderness. There is no right CVA tenderness, left CVA tenderness, guarding or rebound.      Hernia: No hernia is present.   Genitourinary:     Rectum: Normal.    Musculoskeletal:         General: No swelling, tenderness, deformity or signs of injury. Normal range of motion.      Right lower leg: No edema.      Left lower leg: No edema.   Skin:     General: Skin is warm and dry.      Capillary Refill: Capillary refill takes less than 2 seconds.      Coloration: Skin is not jaundiced or pale.      Findings: No bruising, erythema, lesion or rash.   Neurological:      General: No focal deficit present.      Mental Status: He is alert and oriented to person, place, and time. Mental status is at baseline.      Cranial Nerves: No cranial nerve deficit.      Sensory: No sensory deficit.      Motor: No weakness.      Coordination: Coordination normal.      Gait: Gait normal.      Deep Tendon Reflexes: Reflexes normal.   Psychiatric:         Mood and Affect: Mood normal.         Behavior: Behavior normal.         Thought Content: Thought content normal.         Judgment: Judgment normal.                Diagnostics     Labs/Studies Pending at Discharge: No    Last Labs   Recent Labs   Lab 06/14/20  0351 06/12/20  0334 06/11/20  0846   WBC 10.89* 14.90* 22.24*   RBC 4.16* 4.58 4.79   Hgb 12.8 14.0 14.6   Hematocrit 38.1 44.0 43.7   MCV 91.6 96.1* 91.2   Platelets 201 157 262       Recent Labs   Lab 06/14/20  0351 06/13/20  0436 06/12/20  0334 06/11/20  0846 06/10/20  2323   Sodium 138 141 139 141 148*   Potassium 3.6 4.8 4.3 4.9 1.6*   Chloride 103 107 104 104 129*   CO2 27 21* 25 20* 10*   BUN 11.0 11.0 16.0 19.0 8.0*   Creatinine 1.1 1.3 1.3 1.7* 0.7   Glucose 98 105* 102* 96 63*   Calcium 9.5 10.2 10.0 10.8* 4.8*   Magnesium  --   --   --  3.1* 1.0*       Microbiology Results (last 15 days)     Procedure Component Value Units Date/Time    COVID-19 (SARS-COV-2) [facility] Rapid) [[id number]] Collected: 06/11/20 0223    Order Status: Completed Specimen: Nasopharyngeal Swab from Nasopharynx Updated: 06/11/20 0250     Purpose of COVID testing Screening     SARS-CoV-2 Specimen Source  Nasopharyngeal     SARS CoV 2 Overall Result Negative     Comment: Test performed using the Abbott ID NOW EUA assay.  Please see Fact Sheets for patients and providers located at:  http://www.rice.biz/    This test is for the qualitative detection of SARS-CoV-2  (COVID19) nucleic acid. Viral nucleic acids may persist in vivo,  independent of viability. Detection of viral nucleic acid does  not imply the presence of infectious virus,  or that virus  nucleic acid is the cause of clinical symptoms. Negative  results should be treated as presumptive and, if inconsistent  with clinical signs and symptoms or necessary for patient  management, should be tested with an alternative molecular  assay. Negative results do not preclude SARS-CoV-2 infection  and should not be used as the sole basis for patient  management decisions. Invalid results may be due to inhibiting  substances in the specimen and recollection should occur.         Narrative:      o Collect and clearly label specimen type:  o Upper respiratory specimen: One Nasopharyngeal Dry Swab NO  Transport Media.  o Hand deliver to laboratory ASAP  Indication for testing->Extended care facility admission to  semi private room  Screening          Imaging:   CT Abd/Pelvis with IV Contrast only    Result Date: 06/11/2020  Mild gastritis morphology in the appropriate clinical setting There are other chronic/incidental findings as described.  Candie Chroman, MD  06/11/2020 2:18 AM       Patient Instructions   Discharge Diet: full liquid and advance as tolerated    Discharge Activity: as tolerated    Follow Up Appointment:  Follow-up Information     [facility] [place] Follow up.    Specialty: Family Medicine  Why: Primary Care Clinic   Contact information:  [address]  [address]  [place] [address]  [phone]  Additional information:  From [address] - take [address] Go west on [address]. Go through the  intersection with [address]. Destination is on your left.      From [address] - go east on [address] past [employer]. Destination is on your right.           [address] [place] Follow up.    Specialty: Addiction Medicine  Why: Marijuana/Nicotine  use counseling   Contact information:  [address]  [place] [address]  [phone]  Additional information:  Take the [place] (1) to the [employer] or [employer] exits. Go East to the [employer].            Pcp, None, MD .                  Time spent examining patient, discussing with patient/family regarding hospital course, chart review, reconciling medications and discharge planning: 35 minutes.    Signed,  Teodora Medici MD, MPH  11:18 AM 06/14/2020   Please contact via Secure Chat   SpectraLink: [phone]  Pager: [phone]         This chart was generated using hospital voice-recognition software which does not employ spell-checking or grammar-checking features. It was dictated, all or in part, in a busy and often noisy patient care environment. I have taken all usual measures to dictate carefully and to review all aspects this chart. Nonetheless, given the known and well-documented performance characteristics of VR software in such patient care environments, this dictation still may contain unrecognized and wholly unintended errors or omissions

## 2020-10-31 ENCOUNTER — Observation Stay
Admission: EM | Admit: 2020-10-31 | Discharge: 2020-11-02 | Disposition: A | Discharge status: Home / Self Care | Payer: Medicaid HMO | Attending: Internal Medicine | Admitting: Internal Medicine

## 2020-10-31 ENCOUNTER — Emergency Department: Payer: Medicaid HMO

## 2020-10-31 DIAGNOSIS — D72829 Elevated white blood cell count, unspecified: Secondary | ICD-10-CM | POA: Insufficient documentation

## 2020-10-31 DIAGNOSIS — N179 Acute kidney failure, unspecified: Secondary | ICD-10-CM | POA: Insufficient documentation

## 2020-10-31 DIAGNOSIS — F319 Bipolar disorder, unspecified: Secondary | ICD-10-CM | POA: Insufficient documentation

## 2020-10-31 DIAGNOSIS — E876 Hypokalemia: Secondary | ICD-10-CM | POA: Insufficient documentation

## 2020-10-31 DIAGNOSIS — F1290 Cannabis use, unspecified, uncomplicated: Principal | ICD-10-CM | POA: Insufficient documentation

## 2020-10-31 DIAGNOSIS — E860 Dehydration: Secondary | ICD-10-CM | POA: Insufficient documentation

## 2020-10-31 DIAGNOSIS — Z87820 Personal history of traumatic brain injury: Secondary | ICD-10-CM | POA: Insufficient documentation

## 2020-10-31 DIAGNOSIS — R079 Chest pain, unspecified: Secondary | ICD-10-CM

## 2020-10-31 DIAGNOSIS — R112 Nausea with vomiting, unspecified: Secondary | ICD-10-CM | POA: Diagnosis present

## 2020-10-31 DIAGNOSIS — F1411 Cocaine abuse, in remission: Secondary | ICD-10-CM | POA: Insufficient documentation

## 2020-10-31 DIAGNOSIS — F1010 Alcohol abuse, uncomplicated: Secondary | ICD-10-CM | POA: Insufficient documentation

## 2020-10-31 DIAGNOSIS — F1111 Opioid abuse, in remission: Secondary | ICD-10-CM | POA: Insufficient documentation

## 2020-10-31 DIAGNOSIS — E872 Acidosis: Secondary | ICD-10-CM | POA: Insufficient documentation

## 2020-10-31 DIAGNOSIS — Z87891 Personal history of nicotine dependence: Secondary | ICD-10-CM | POA: Insufficient documentation

## 2020-10-31 LAB — CBC AND DIFFERENTIAL
Absolute NRBC: 0 10*3/uL (ref 0.00–0.00)
Basophils Absolute Automated: 0.04 10*3/uL (ref 0.00–0.08)
Basophils Automated: 0.2 %
Eosinophils Absolute Automated: 0 10*3/uL (ref 0.00–0.44)
Eosinophils Automated: 0 %
Hematocrit: 48.6 % (ref 37.6–49.6)
Hgb: 16.9 g/dL (ref 12.5–17.1)
Immature Granulocytes Absolute: 0.08 10*3/uL — ABNORMAL HIGH (ref 0.00–0.07)
Immature Granulocytes: 0.5 %
Lymphocytes Absolute Automated: 2.94 10*3/uL (ref 0.42–3.22)
Lymphocytes Automated: 18 %
MCH: 30.5 pg (ref 25.1–33.5)
MCHC: 34.8 g/dL (ref 31.5–35.8)
MCV: 87.7 fL (ref 78.0–96.0)
MPV: 12.2 fL (ref 8.9–12.5)
Monocytes Absolute Automated: 1.2 10*3/uL — ABNORMAL HIGH (ref 0.21–0.85)
Monocytes: 7.3 %
Neutrophils Absolute: 12.11 10*3/uL — ABNORMAL HIGH (ref 1.10–6.33)
Neutrophils: 74 %
Nucleated RBC: 0 /100 WBC (ref 0.0–0.0)
Platelets: 300 10*3/uL (ref 142–346)
RBC: 5.54 10*6/uL (ref 4.20–5.90)
RDW: 16 % — ABNORMAL HIGH (ref 11–15)
WBC: 16.37 10*3/uL — ABNORMAL HIGH (ref 3.10–9.50)

## 2020-10-31 MED ORDER — SODIUM CHLORIDE 0.9 % IV BOLUS
1000.0000 mL | Freq: Once | INTRAVENOUS | Status: AC
Start: 2020-10-31 — End: 2020-11-01
  Administered 2020-10-31: 23:00:00 1000 mL via INTRAVENOUS

## 2020-10-31 MED ORDER — KETOROLAC TROMETHAMINE 30 MG/ML IJ SOLN
30.0000 mg | Freq: Once | INTRAMUSCULAR | Status: AC
Start: 2020-10-31 — End: 2020-10-31
  Administered 2020-10-31: 30 mg via INTRAVENOUS
  Filled 2020-10-31: qty 1

## 2020-10-31 MED ORDER — NALOXONE HCL 0.4 MG/ML IJ SOLN (WRAP)
0.4000 mg | Freq: Once | INTRAMUSCULAR | Status: AC
Start: 2020-10-31 — End: 2020-10-31
  Administered 2020-10-31: 0.4 mg via INTRAVENOUS
  Filled 2020-10-31: qty 1

## 2020-10-31 MED ORDER — HALOPERIDOL LACTATE 5 MG/ML IJ SOLN
2.5000 mg | Freq: Once | INTRAMUSCULAR | Status: AC
Start: 2020-10-31 — End: 2020-10-31
  Administered 2020-10-31: 2.5 mg via INTRAVENOUS
  Filled 2020-10-31: qty 1

## 2020-10-31 NOTE — EDIE (Signed)
COLLECTIVE?NOTIFICATION?10/31/2020 22:10?Joel [name], Joel [name]?MRN: [id number]    Criteria Met      5 ED Visits in 12 Months    Security and Safety  No recent Security Events currently on file    ED Care Guidelines  There are currently no ED Care Guidelines for this patient. Please check your facility's medical records system.        Prescription Monitoring Program  000??- Narcotic Use Score  000??- Sedative Use Score  000??- Stimulant Use Score  000??- Overdose Risk Score  - All Scores range from 000-999 with 75% of the population scoring < 200 and on 1% scoring above 650  - The last digit of the narcotic, sedative, and stimulant score indicates the number of active prescriptions of that type  - Higher Use scores correlate with increased prescribers, pharmacies, mg equiv, and overlapping prescriptions  - Higher Overdose Risk Scores correlate with increased risk of unintentional overdose death   Concerning or unexpectedly high scores should prompt a review of the PMP record; this does not constitute checking PMP for prescribing purposes.      [name].D. Visit Count (12 mo.)  Facility Visits   [place] [facility] 1   [facility] 6   Total 7   Note: Visits indicate total known visits.     Recent Emergency Department Visit Summary  Date Facility [facility] Type Diagnoses or Chief Complaint   Oct 31, 2020 [place] [facility] [place] [place] [facility]      Triage      Jun 10, 2020 [place] [facility] [place] [place] [facility]      dehydrated, vomiting      Hyperemesis Gravidarum      Emesis      Hypocalcemia      Nausea with vomiting, unspecified      Generalized abdominal pain      Hyperosmolality and hypernatremia      Hypokalemia      Apr 25, 2020 [place] [facility] [place] [facility] [place] [place] [facility]      Triage      Triage--N/V      Emesis      Dizziness      Chest Pain      Dehydration      Apr 19, 2020 [place] [facility] [place] [facility] [place] [place] [facility]      Triage A      Triage A dehydration      Emesis      Dehydration      Dec 14, 2019 [place]  [facility] [place] [facility] [place] [facility]      Triage A      Dehydration      Chest Pain      Hypo-osmolality and hyponatremia      Acidosis      Acute kidney failure, unspecified      Dec 03, 2019 [place] [facility] [place] [facility] [place] [place] [facility]      triage a      Nausea      Emesis      Abdominal Pain      Acute kidney failure, unspecified      Nausea with vomiting, unspecified      Dehydration      Dec 01, 2019 [place] [facility] [place] [facility] [place]      triage-      triage- dehydration      Emesis      Acute kidney failure, unspecified  Recent Inpatient Visit Summary  Date Facility [facility] Type Diagnoses or Chief Complaint   Jun 10, 2020 [place] [facility] [place] [facility]      Hyperosmolality and hypernatremia      Hypokalemia      Generalized abdominal pain      Hypocalcemia      Nausea with vomiting, unspecified      Apr 19, 2020 [place] [facility] [place] [facility] [place]      Dehydration      Acute kidney failure, unspecified      Vomiting, unspecified      Anxiety disorder, unspecified      Dec 01, 2019 [place] [facility] [place] [facility] [place]      Acute kidney failure, unspecified          Care Team  There is not a care team on record at this time.   Collective Portal  This patient has registered at the [facility] Emergency Department   For more information visit: [email or web address]     PLEASE NOTE:     1.   Any care recommendations and other clinical information are provided as guidelines or for historical purposes only, and providers should exercise their own clinical judgment when providing care.    2.   You may only use this information for purposes of treatment, payment or health care operations activities, and subject to the limitations of applicable Collective Policies.    3.   You should consult directly with the organization that provided a care guideline or other clinical history with any questions about  additional information or accuracy or completeness of information provided.    ? 2022 [place] - [email or web address]

## 2020-11-01 ENCOUNTER — Emergency Department: Payer: Medicaid HMO

## 2020-11-01 DIAGNOSIS — R112 Nausea with vomiting, unspecified: Secondary | ICD-10-CM

## 2020-11-01 DIAGNOSIS — R4182 Altered mental status, unspecified: Secondary | ICD-10-CM

## 2020-11-01 DIAGNOSIS — Z5189 Encounter for other specified aftercare: Secondary | ICD-10-CM

## 2020-11-01 DIAGNOSIS — F1290 Cannabis use, unspecified, uncomplicated: Secondary | ICD-10-CM

## 2020-11-01 DIAGNOSIS — N179 Acute kidney failure, unspecified: Secondary | ICD-10-CM

## 2020-11-01 LAB — ECG 12-LEAD
Atrial Rate: 71 {beats}/min
Atrial Rate: 71 {beats}/min
P Axis: 79 degrees
P Axis: 79 degrees
P-R Interval: 130 ms
P-R Interval: 130 ms
Q-T Interval: 492 ms
Q-T Interval: 496 ms
QRS Duration: 144 ms
QRS Duration: 146 ms
QTC Calculation (Bezet): 534 ms
QTC Calculation (Bezet): 538 ms
R Axis: 86 degrees
R Axis: 86 degrees
T Axis: 60 degrees
T Axis: 64 degrees
Ventricular Rate: 71 {beats}/min
Ventricular Rate: 71 {beats}/min

## 2020-11-01 LAB — HEPATIC FUNCTION PANEL
ALT: 16 U/L (ref 0–55)
AST (SGOT): 31 U/L (ref 5–34)
Albumin/Globulin Ratio: 1.5 (ref 0.9–2.2)
Albumin: 5.8 g/dL — ABNORMAL HIGH (ref 3.5–5.0)
Alkaline Phosphatase: 66 U/L (ref 38–106)
Bilirubin Direct: 0.5 mg/dL (ref 0.0–0.5)
Bilirubin Indirect: 0.9 mg/dL (ref 0.2–1.0)
Bilirubin, Total: 1.4 mg/dL — ABNORMAL HIGH (ref 0.2–1.2)
Globulin: 4 g/dL — ABNORMAL HIGH (ref 2.0–3.6)
Protein, Total: 9.8 g/dL — ABNORMAL HIGH (ref 6.0–8.3)

## 2020-11-01 LAB — BASIC METABOLIC PANEL
Anion Gap: 25 — ABNORMAL HIGH (ref 5.0–15.0)
Anion Gap: 14 (ref 5.0–15.0)
BUN: 25 mg/dL (ref 9.0–28.0)
BUN: 30 mg/dL — ABNORMAL HIGH (ref 9.0–28.0)
CO2: 16 mEq/L — ABNORMAL LOW (ref 22–29)
CO2: 20 mEq/L — ABNORMAL LOW (ref 22–29)
Calcium: 11.5 mg/dL — ABNORMAL HIGH (ref 8.5–10.5)
Calcium: 9.2 mg/dL (ref 8.5–10.5)
Chloride: 99 mEq/L — ABNORMAL LOW (ref 100–111)
Chloride: 106 mEq/L (ref 100–111)
Creatinine: 2.6 mg/dL — ABNORMAL HIGH (ref 0.7–1.3)
Creatinine: 1.9 mg/dL — ABNORMAL HIGH (ref 0.7–1.3)
Glucose: 87 mg/dL (ref 70–100)
Glucose: 89 mg/dL (ref 70–100)
Potassium: 3.1 mEq/L — ABNORMAL LOW (ref 3.5–5.1)
Potassium: 4.1 mEq/L (ref 3.5–5.1)
Sodium: 140 mEq/L (ref 136–145)
Sodium: 140 mEq/L (ref 136–145)

## 2020-11-01 LAB — CBC AND DIFFERENTIAL
Absolute NRBC: 0 10*3/uL (ref 0.00–0.00)
Basophils Absolute Automated: 0.03 10*3/uL (ref 0.00–0.08)
Basophils Automated: 0.2 %
Eosinophils Absolute Automated: 0 10*3/uL (ref 0.00–0.44)
Eosinophils Automated: 0 %
Hematocrit: 42.8 % (ref 37.6–49.6)
Hgb: 14.4 g/dL (ref 12.5–17.1)
Immature Granulocytes Absolute: 0.06 10*3/uL (ref 0.00–0.07)
Immature Granulocytes: 0.4 %
Lymphocytes Absolute Automated: 2.61 10*3/uL (ref 0.42–3.22)
Lymphocytes Automated: 18.3 %
MCH: 30.1 pg (ref 25.1–33.5)
MCHC: 33.6 g/dL (ref 31.5–35.8)
MCV: 89.4 fL (ref 78.0–96.0)
MPV: 11.8 fL (ref 8.9–12.5)
Monocytes Absolute Automated: 1.14 10*3/uL — ABNORMAL HIGH (ref 0.21–0.85)
Monocytes: 8 %
Neutrophils Absolute: 10.46 10*3/uL — ABNORMAL HIGH (ref 1.10–6.33)
Neutrophils: 73.1 %
Nucleated RBC: 0 /100 WBC (ref 0.0–0.0)
Platelets: 245 10*3/uL (ref 142–346)
RBC: 4.79 10*6/uL (ref 4.20–5.90)
RDW: 16 % — ABNORMAL HIGH (ref 11–15)
WBC: 14.3 10*3/uL — ABNORMAL HIGH (ref 3.10–9.50)

## 2020-11-01 LAB — URINALYSIS, REFLEX TO MICROSCOPIC EXAM IF INDICATED
Bilirubin, UA: NEGATIVE
Blood, UA: NEGATIVE
Glucose, UA: NEGATIVE
Ketones UA: 20 — AB
Leukocyte Esterase, UA: NEGATIVE
Nitrite, UA: NEGATIVE
Protein, UR: 30 — AB
Specific Gravity UA: 1.034 (ref 1.001–1.035)
Urine pH: 5 (ref 5.0–8.0)
Urobilinogen, UA: NORMAL mg/dL (ref 0.2–2.0)

## 2020-11-01 LAB — TROPONIN I: Troponin I: 0.05 ng/mL (ref 0.00–0.05)

## 2020-11-01 LAB — GFR
EGFR: 35.4
EGFR: 50.9

## 2020-11-01 LAB — OSMOLALITY, URINE: Urine Osmolality: 1168 mosm/kg — ABNORMAL HIGH (ref 300–1094)

## 2020-11-01 LAB — B-TYPE NATRIURETIC PEPTIDE: B-Natriuretic Peptide: 27 pg/mL (ref 0–100)

## 2020-11-01 LAB — SODIUM, URINE, RANDOM: Urine Sodium Random: 49 mEq/L

## 2020-11-01 LAB — LACTIC ACID, PLASMA: Lactic Acid: 1.1 mmol/L (ref 0.2–2.0)

## 2020-11-01 LAB — CK: Creatine Kinase (CK): 1039 U/L — ABNORMAL HIGH (ref 47–267)

## 2020-11-01 LAB — LIPASE: Lipase: 22 U/L (ref 8–78)

## 2020-11-01 LAB — CREATININE, URINE, RANDOM: Urine Creatinine, Random: 477.1 mg/dL

## 2020-11-01 MED ORDER — FAMOTIDINE 20 MG PO TABS
20.0000 mg | ORAL_TABLET | Freq: Two times a day (BID) | ORAL | Status: DC
Start: 2020-11-01 — End: 2020-11-02
  Administered 2020-11-01 – 2020-11-02 (×3): 20 mg via ORAL
  Filled 2020-11-01 (×3): qty 1

## 2020-11-01 MED ORDER — POTASSIUM CHLORIDE 10 MEQ/100ML IV SOLN (WRAP)
10.0000 meq | INTRAVENOUS | Status: AC
Start: 2020-11-01 — End: 2020-11-01
  Administered 2020-11-01 (×2): 10 meq via INTRAVENOUS
  Filled 2020-11-01 (×2): qty 100

## 2020-11-01 MED ORDER — METOCLOPRAMIDE HCL 5 MG/ML IJ SOLN
10.0000 mg | Freq: Once | INTRAMUSCULAR | Status: AC
Start: 2020-11-01 — End: 2020-11-01
  Administered 2020-11-01: 03:00:00 10 mg via INTRAVENOUS
  Filled 2020-11-01: qty 2

## 2020-11-01 MED ORDER — LACTATED RINGERS IV SOLN
INTRAVENOUS | Status: DC
Start: 2020-11-01 — End: 2020-11-02

## 2020-11-01 MED ORDER — ONDANSETRON HCL 4 MG/2ML IJ SOLN
4.0000 mg | Freq: Three times a day (TID) | INTRAMUSCULAR | Status: DC | PRN
Start: 2020-11-01 — End: 2020-11-02
  Administered 2020-11-01: 23:00:00 4 mg via INTRAVENOUS
  Filled 2020-11-01: qty 2

## 2020-11-01 MED ORDER — ONDANSETRON HCL 4 MG/2ML IJ SOLN
4.0000 mg | Freq: Once | INTRAMUSCULAR | Status: AC
Start: 2020-11-01 — End: 2020-11-01
  Administered 2020-11-01: 02:00:00 4 mg via INTRAVENOUS
  Filled 2020-11-01: qty 2

## 2020-11-01 MED ORDER — HALOPERIDOL LACTATE 5 MG/ML IJ SOLN
2.5000 mg | Freq: Once | INTRAMUSCULAR | Status: AC
Start: 2020-11-01 — End: 2020-11-01
  Administered 2020-11-01: 04:00:00 2.5 mg via INTRAVENOUS
  Filled 2020-11-01: qty 1

## 2020-11-01 MED ORDER — NALOXONE HCL 0.4 MG/ML IJ SOLN (WRAP)
0.4000 mg | Freq: Once | INTRAMUSCULAR | Status: DC
Start: 2020-11-01 — End: 2020-11-02
  Filled 2020-11-01: qty 1

## 2020-11-01 MED ORDER — CAPSAICIN 0.025 % EX CREA
TOPICAL_CREAM | Freq: Three times a day (TID) | CUTANEOUS | Status: DC
Start: 2020-11-01 — End: 2020-11-02
  Filled 2020-11-01: qty 60

## 2020-11-01 MED ORDER — SCOPOLAMINE 1 MG/3DAYS TD PT72
1.0000 | MEDICATED_PATCH | TRANSDERMAL | Status: DC
Start: 2020-11-01 — End: 2020-11-02
  Administered 2020-11-01: 06:00:00 1 via TRANSDERMAL
  Filled 2020-11-01: qty 1

## 2020-11-01 MED ORDER — ONDANSETRON 4 MG PO TBDP
4.0000 mg | ORAL_TABLET | Freq: Four times a day (QID) | ORAL | Status: DC | PRN
Start: 2020-11-01 — End: 2020-11-02

## 2020-11-01 MED ORDER — HEPARIN SODIUM (PORCINE) 5000 UNIT/ML IJ SOLN
5000.0000 [IU] | Freq: Three times a day (TID) | INTRAMUSCULAR | Status: DC
Start: 2020-11-01 — End: 2020-11-02
  Administered 2020-11-01 – 2020-11-02 (×4): 5000 [IU] via SUBCUTANEOUS
  Filled 2020-11-01 (×3): qty 1

## 2020-11-01 MED ORDER — NALOXONE HCL 0.4 MG/ML IJ SOLN (WRAP)
0.2000 mg | INTRAMUSCULAR | Status: DC | PRN
Start: 2020-11-01 — End: 2020-11-02

## 2020-11-01 MED ORDER — GLUCAGON 1 MG IJ SOLR (WRAP)
1.0000 mg | INTRAMUSCULAR | Status: DC | PRN
Start: 2020-11-01 — End: 2020-11-02

## 2020-11-01 MED ORDER — SERTRALINE HCL 50 MG PO TABS
25.0000 mg | ORAL_TABLET | Freq: Every day | ORAL | Status: DC
Start: 2020-11-01 — End: 2020-11-02
  Administered 2020-11-01 – 2020-11-02 (×2): 25 mg via ORAL
  Filled 2020-11-01 (×2): qty 1

## 2020-11-01 MED ORDER — SODIUM CHLORIDE 0.9 % IV BOLUS
1000.0000 mL | Freq: Once | INTRAVENOUS | Status: AC
Start: 2020-11-01 — End: 2020-11-01
  Administered 2020-11-01: 04:00:00 1000 mL via INTRAVENOUS

## 2020-11-01 MED ORDER — DEXTROSE 10 % IV BOLUS
12.5000 g | INTRAVENOUS | Status: DC | PRN
Start: 2020-11-01 — End: 2020-11-02

## 2020-11-01 MED ORDER — GLUCOSE 40 % PO GEL (WRAP)
15.0000 g | ORAL | Status: DC | PRN
Start: 2020-11-01 — End: 2020-11-02

## 2020-11-01 MED ORDER — MORPHINE SULFATE 2 MG/ML IJ/IV SOLN (WRAP)
2.0000 mg | Freq: Once | Status: AC
Start: 2020-11-01 — End: 2020-11-01
  Administered 2020-11-01: 22:00:00 2 mg via INTRAVENOUS
  Filled 2020-11-01: qty 1

## 2020-11-01 MED ORDER — MELATONIN 3 MG PO TABS
3.0000 mg | ORAL_TABLET | Freq: Every evening | ORAL | Status: DC | PRN
Start: 2020-11-01 — End: 2020-11-02
  Administered 2020-11-01: 21:00:00 3 mg via ORAL
  Filled 2020-11-01: qty 1

## 2020-11-01 MED ORDER — ACETAMINOPHEN 325 MG PO TABS
650.0000 mg | ORAL_TABLET | Freq: Four times a day (QID) | ORAL | Status: DC | PRN
Start: 2020-11-01 — End: 2020-11-02
  Administered 2020-11-01: 650 mg via ORAL
  Filled 2020-11-01: qty 2
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
## 2020-11-01 NOTE — ED Notes (Signed)
Patient is sleeping, O2 found to be 68% on room air. No s/s of SOB or hypoxia. When patient aroused, O2 back up to 95%. Patient on room air. No signs of distress. MD haciski at bedside

## 2020-11-01 NOTE — ED Notes (Signed)
Pt vomiting. Md Pabellones aware.

## 2020-11-01 NOTE — UM Notes (Addendum)
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  Patient with multiple admissions for this issue, as recently as before arriving at [facility].  He is required psychiatric evaluation during prior admission particularly in August 2021 for self-injurious behavior.  He was not discharged on any medications for psychiatric perspective at that time.    Initial VS:  98.1, 100, 19, 100% sats, 157/100    L.R at 100 cc/hr    Plan:   -Scopolamine patch for now; hold zofran until repeat EKG as qtc 492 on scanned EKG from 05/2020  -had done well with capsicin cream to abdomen; also used to treat cannabinoid hyperemesis syndrome  -PPI qD, Carafate given history of gastritis   -area postrema could have been injured with TBI and could be leading to n/v; would ensure has adequate oupt follow up and workup to r/o other causes of vomiting in his case   -UDS and UA, blood cx   -Urine studies, CPK and IV hydration for acute renal failure and subsequent electrolyte derangements and acidosis; has seen [facility] in the past, if does not improve, would consult     Sandria Senter, MD,PhD  [facility] Adult Hospitalist  Pager [phone]
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
## 2020-11-01 NOTE — ED Notes (Addendum)
Pt dropping SPO2 when sleeping Presenting periods of apnea desaturating to low 80%. Fletcher Anon MD aware

## 2020-11-01 NOTE — Plan of Care (Signed)
Problem: Pain  Goal: Pain at adequate level as identified by patient  Outcome: Not Progressing  Flowsheets (Taken 11/01/2020 2246)  Pain at adequate level as identified by patient:   Identify patient comfort function goal   Assess for risk of opioid induced respiratory depression, including snoring/sleep apnea. Alert healthcare team of risk factors identified.   Reassess pain within 30-60 minutes of any procedure/intervention, per Pain Assessment, Intervention, Reassessment (AIR) Cycle     Problem: Safety  Goal: Patient will be free from injury during hospitalization  Outcome: Progressing  Flowsheets (Taken 11/01/2020 2246)  Patient will be free from injury during hospitalization:   Assess patient's risk for falls and implement fall prevention plan of care per policy   Provide and maintain safe environment   Ensure appropriate safety devices are available at the bedside   Use appropriate transfer methods   Include patient/ family/ care giver in decisions related to safety   Hourly rounding   Assess for patients risk for elopement and implement Elopement Risk Plan per policy  Goal: Patient will be free from infection during hospitalization  Outcome: Progressing  Flowsheets (Taken 11/01/2020 2246)  Free from Infection during hospitalization:   Assess and monitor for signs and symptoms of infection   Monitor lab/diagnostic results   Monitor all insertion sites (i.e. indwelling lines, tubes, urinary catheters, and drains)   Encourage patient and family to use good hand hygiene technique     Problem: Side Effects from Pain Analgesia  Goal: Patient will experience minimal side effects of analgesic therapy  Outcome: Progressing  Flowsheets (Taken 11/01/2020 2246)  Patient will experience minimal side effects of analgesic therapy:   Monitor/assess patient's respiratory status (RR depth, effort, breath sounds)   Assess for changes in cognitive function     Problem: Discharge Barriers  Goal: Patient will be discharged home or other  facility with appropriate resources  Outcome: Progressing  Flowsheets (Taken 11/01/2020 2246)  Discharge to home or other facility with appropriate resources:   Provide appropriate patient education   Provide information on available health resources     Problem: Psychosocial and Spiritual Needs  Goal: Demonstrates ability to cope with hospitalization/illness  Outcome: Progressing  Flowsheets (Taken 11/01/2020 2246)  Demonstrates ability to cope with hospitalizations/illness:   Reinforce positive adaptation of new coping behaviors   Encourage verbalization of feelings/concerns/expectations   Provide quiet environment   Assist patient to identify own strengths and abilities   Encourage patient to set small goals for self   Encourage participation in diversional activity   Include patient/ patient care companion in decisions     Problem: Moderate/High Fall Risk Score >5  Goal: Patient will remain free of falls  Outcome: Progressing  Flowsheets (Taken 11/01/2020 2246)  Moderate Risk (6-13):   MOD-Consider activation of bed alarm if appropriate   MOD-Utilize diversion activities

## 2020-11-01 NOTE — ED Notes (Signed)
[facility] EMERGENCY DEPT  ED NURSING NOTE FOR THE RECEIVING INPATIENT NURSE   ED Guthrie RN   [place] [id number]   ED CHARGE RN Corrie Dandy RN   ADMISSION INFORMATION   Joel [name]. is a 30 y.o. male admitted with an ED diagnosis of:    1. Cannabinoid hyperemesis syndrome         Isolation: None   Allergies: Pollen extract   Holding Orders confirmed? Yes   Belongings Documented? Yes   Home medications sent to pharmacy confirmed? N/A   NURSING CARE   Patient Comes From:   Mental Status: Home Independent  alert, oriented and anxious   ADL: Independent with all ADLs   Ambulation: no difficulty   Pertinent Information  and Safety Concerns: Pt alert and oriented x4. Pt has been calm and cooperative througout the shift.      CT / NIH   CT Head ordered on this patient?  N/A   NIH/Dysphagia assessment done prior to admission? N/A   VITAL SIGNS (at the time of this note)      Vitals:    11/01/20 0415   BP: 110/58   Pulse: 80   Resp: 16   Temp: 98.9 F (37.2 C)   SpO2: 97%

## 2020-11-01 NOTE — Plan of Care (Signed)
Patient was admitted today 3/5. Please see full H/P.    Patient was seen and evaluated with Dr. Willaim Bane on am rounds. Patient seems sleepy this am (got 2 doses of IV haldol in the ED), but able to awakens with verbal stimuli and answers questions. Does c/o nausea. Does not feel hungry. No other complaints.   -Continue current plan of care  -Started on MIVF LR at 100 ml/hour  -BMP in am     Visit Vitals  BP 112/56   Pulse 72   Temp 97.9 F (36.6 C) (Oral)   Resp 18   Ht 1.676 m (5\' 6" )   Wt 57.2 kg (126 lb)   SpO2 98%   BMI 20.34 kg/m     Lenard Galloway NP  Internal Medicine

## 2020-11-01 NOTE — Plan of Care (Signed)
Problem: Safety  Goal: Patient will be free from injury during hospitalization  Outcome: Progressing  Flowsheets (Taken 11/01/2020 1534)  Patient will be free from injury during hospitalization:   Use appropriate transfer methods   Provide and maintain safe environment   Assess patient's risk for falls and implement fall prevention plan of care per policy     Problem: Pain  Goal: Pain at adequate level as identified by patient  Outcome: Progressing  Flowsheets (Taken 11/01/2020 1534)  Pain at adequate level as identified by patient:   Identify patient comfort function goal   Offer non-pharmacological pain management interventions   Reassess pain within 30-60 minutes of any procedure/intervention, per Pain Assessment, Intervention, Reassessment (AIR) Cycle   Assess pain on admission, during daily assessment and/or before any "as needed" intervention(s)     Problem: Psychosocial and Spiritual Needs  Goal: Demonstrates ability to cope with hospitalization/illness  Outcome: Progressing  Flowsheets (Taken 11/01/2020 1534)  Demonstrates ability to cope with hospitalizations/illness:   Encourage participation in Community education officer verbalization of feelings/concerns/expectations

## 2020-11-02 LAB — RAPID DRUG SCREEN, URINE
Barbiturate Screen, UR: NEGATIVE
Benzodiazepine Screen, UR: NEGATIVE
Cannabinoid Screen, UR: POSITIVE — AB
Cocaine, UR: NEGATIVE
Opiate Screen, UR: POSITIVE — AB
PCP Screen, UR: NEGATIVE
Urine Amphetamine Screen: NEGATIVE

## 2020-11-02 LAB — BASIC METABOLIC PANEL
Anion Gap: 16 — ABNORMAL HIGH (ref 5.0–15.0)
BUN: 31 mg/dL — ABNORMAL HIGH (ref 9.0–28.0)
CO2: 20 mEq/L — ABNORMAL LOW (ref 22–29)
Calcium: 10.5 mg/dL (ref 8.5–10.5)
Chloride: 102 mEq/L (ref 100–111)
Creatinine: 1.9 mg/dL — ABNORMAL HIGH (ref 0.7–1.3)
Glucose: 97 mg/dL (ref 70–100)
Potassium: 3.8 mEq/L (ref 3.5–5.1)
Sodium: 138 mEq/L (ref 136–145)

## 2020-11-02 LAB — CBC AND DIFFERENTIAL
Absolute NRBC: 0 10*3/uL (ref 0.00–0.00)
Basophils Absolute Automated: 0.05 10*3/uL (ref 0.00–0.08)
Basophils Automated: 0.3 %
Eosinophils Absolute Automated: 0 10*3/uL (ref 0.00–0.44)
Eosinophils Automated: 0 %
Hematocrit: 45.2 % (ref 37.6–49.6)
Hgb: 15.4 g/dL (ref 12.5–17.1)
Immature Granulocytes Absolute: 0.06 10*3/uL (ref 0.00–0.07)
Immature Granulocytes: 0.4 %
Lymphocytes Absolute Automated: 1.96 10*3/uL (ref 0.42–3.22)
Lymphocytes Automated: 13.5 %
MCH: 30.3 pg (ref 25.1–33.5)
MCHC: 34.1 g/dL (ref 31.5–35.8)
MCV: 89 fL (ref 78.0–96.0)
MPV: 11.5 fL (ref 8.9–12.5)
Monocytes Absolute Automated: 0.97 10*3/uL — ABNORMAL HIGH (ref 0.21–0.85)
Monocytes: 6.7 %
Neutrophils Absolute: 11.49 10*3/uL — ABNORMAL HIGH (ref 1.10–6.33)
Neutrophils: 79.1 %
Nucleated RBC: 0 /100 WBC (ref 0.0–0.0)
Platelets: 242 10*3/uL (ref 142–346)
RBC: 5.08 10*6/uL (ref 4.20–5.90)
RDW: 16 % — ABNORMAL HIGH (ref 11–15)
WBC: 14.53 10*3/uL — ABNORMAL HIGH (ref 3.10–9.50)

## 2020-11-02 LAB — ECG 12-LEAD
Atrial Rate: 74 {beats}/min
P Axis: 85 degrees
P-R Interval: 128 ms
Q-T Interval: 406 ms
QRS Duration: 126 ms
QTC Calculation (Bezet): 450 ms
R Axis: 90 degrees
T Axis: 75 degrees
Ventricular Rate: 74 {beats}/min

## 2020-11-02 LAB — GFR: EGFR: 50.9

## 2020-11-02 MED ORDER — LACTATED RINGERS IV BOLUS
1000.0000 mL | Freq: Once | INTRAVENOUS | Status: AC
Start: 2020-11-02 — End: 2020-11-02
  Administered 2020-11-02: 10:00:00 1000 mL via INTRAVENOUS

## 2020-11-02 NOTE — Progress Notes (Signed)
Discharge Note    Patient education completed, all questions answered at this time.    Patient/family verbalizes understanding of discharge AVS instructions, new medications, follow-up and s/s of when to contact EMS.     Patient belonging/safe items returned ? n/a    Patient home meds returned? n/a    Patient new medications filled prior to discharge onsite and delivered to bedside? n/a    Patient equipment/home O2 delivered prior to discharge? m/a    IV removed? yes    Tele removed? yes    Patient to discharge to: home    Via: mom

## 2020-11-02 NOTE — Discharge Instr - AVS First Page (Addendum)
Reason for your Hospital Admission:  Hyperemesis       Instructions for after your discharge:  Please take all medications as prescribed  Follow up with your primary doctor in one week after discharge from the hospital   If you have any specialists that you need to follow up with after discharge from the hospital, the Clinic phone number will be provided to you on your paperwork  If you have any worsening of your symptoms and you feel it is an Emergency please seek medical attention and visit your nearest ED

## 2020-11-02 NOTE — Discharge Summary (Signed)
MEDICINE DISCHARGE SUMMARY    Date Time: 11/02/20 11:56 AM  Patient Name: [facility] [name]  Attending Physician: Lesia Sago, MD  Primary Care Physician: Marisa Sprinkles, MD    Date of Admission: 10/31/2020  Date of Discharge: 11/02/2020    Discharge Diagnoses:   # Cannabinoid hyperemesis Syndrome    # Leukocytosis, likely reactive. No s/s infection. Likely 2/2 dehydration  # AKI  # AGMA  # Hypokalemia  #History of TBI (2013)   #History of alcohol abuse  #History of Bipolar Disorder   #hx of cocaine and heroin use    Hospital Course:     Reason for admission/ HPI:    30 y.o. male with a PMH of marijuana use disorder, history of TBI in 2013, alcohol abuse, bipolar disorder, history of cocaine and heroin use, history of cannabinoid hyperemesis syndrome who presented with c/o nausea and vomiting, found to have an AKI and dehydrated. Admitted for further evaluation and treatment. treated supportively w/ antiemetics, IVFs. urine drug + marijuana. Symptoms improved and he was cleared for discharge on 3/6. Of note, on day of discharge given additional LR bolus 1 liter. Recommend follow up OP with [facility] until patient establishes PCP.     Disposition: Home     Discharge Day Exam:  Temp:  [97.9 F (36.6 C)-98.7 F (37.1 C)] 98.1 F (36.7 C)  Heart Rate:  [61-84] 68  Resp Rate:  [16-18] 16  BP: (114-168)/(70-90) 147/90    General: No apparent distress.  Lungs: CTAB.  Normal respiratory effort.   Abdomen: Soft, non-distended, non-tender. Normoactive bowel sounds.   Extremities: No edema.    Pending Results, Recommendations & Instructions to providers after discharge:     1. Micro / Labs / Path pending:   Unresulted Labs     None          Discharge Condition:     Stable     Discharge Instructions & Follow Up Plan for Patient:       Patient was instructed to follow up with:      Follow-up Information     [facility]. Schedule an appointment as soon as possible for a visit in 1  week(s).    Specialty: Family Medicine  Contact information:  [address]  [place] [address]  [phone]  Additional information:  From the [place]:   Take [address]. Take [address] to merge onto [address] toward [place].    Turn right onto [address]. Your destination will be on the left.       From the [place]:   Take [address] towards [address]. Merge onto [address]. Take exit [address]A to merge onto [address]/   [facility] toward [address]. Turn right onto [address]. Your destination will be on the left.                          Complete instructions and follow up are in the patient's After Visit Summary    Minutes spent coordinating discharge and reviewing discharge plan: 40 minutes    Recent Labs:       Results     Procedure Component Value Units Date/Time    Culture Blood Aerobic and Anaerobic [[id number]] Collected: 11/01/20 0939    Specimen: Blood, Venipuncture Updated: 11/02/20 1121    Narrative:      The order will result in two separate 8-65ml bottles  Please do NOT order repeat blood cultures if one has been  drawn within the last 48 hours  UNLESS concerned for  endocarditis  AVOID BLOOD CULTURE DRAWS FROM CENTRAL LINE IF POSSIBLE  Indications:->Sepsis  ORDER#: [id number]                                    ORDERED BY: BROWN, PATRICK  SOURCE: Blood, Venipuncture                          COLLECTED:  11/01/20 09:39  ANTIBIOTICS AT COLL.:                                RECEIVED :  11/01/20 11:15  Culture Blood Aerobic and Anaerobic        PRELIM      11/02/20 11:21  11/02/20   No Growth after 1 day/s of incubation.      Culture Blood Aerobic and Anaerobic [[id number]] Collected: 11/01/20 0939    Specimen: Blood, Venipuncture Updated: 11/02/20 1121    Narrative:      The order will result in two separate 8-2ml bottles  Please do NOT order repeat blood cultures if one has been  drawn within the last 48 hours  UNLESS concerned for  endocarditis  AVOID BLOOD  CULTURE DRAWS FROM CENTRAL LINE IF POSSIBLE  Indications:->Sepsis  ORDER#: [id number]                                    ORDERED BY: BROWN, PATRICK  SOURCE: Blood, Venipuncture                          COLLECTED:  11/01/20 09:39  ANTIBIOTICS AT COLL.:                                RECEIVED :  11/01/20 11:15  Culture Blood Aerobic and Anaerobic        PRELIM      11/02/20 11:21  11/02/20   No Growth after 1 day/s of incubation.      GFR [[id number]] Collected: 11/02/20 0448     Updated: 11/02/20 0552     EGFR 50.9    Basic Metabolic Panel [[id number]]  (Abnormal) Collected: 11/02/20 0448    Specimen: Blood Updated: 11/02/20 0552     Glucose 97 mg/dL      BUN 24.4 mg/dL      Creatinine 1.9 mg/dL      Calcium 01.0 mg/dL      Sodium 272 mEq/L      Potassium 3.8 mEq/L      Chloride 102 mEq/L      CO2 20 mEq/L      Anion Gap 16.0    Rapid drug screen, urine [[id number]]  (Abnormal) Collected: 11/02/20 0448    Specimen: Urine Updated: 11/02/20 0541     Urine Amphetamine Screen Negative     Barbiturate Screen, UR Negative     Benzodiazepine Screen, UR Negative     Cannabinoid Screen, UR Positive     Cocaine, UR Negative     Opiate Screen, UR Positive     PCP Screen, UR Negative  CBC and differential [[id number]]  (Abnormal) Collected: 11/02/20 0448    Specimen: Blood Updated: 11/02/20 0515     WBC 14.53 x10 3/uL      Hgb 15.4 g/dL      Hematocrit 40.9 %      Platelets 242 x10 3/uL      RBC 5.08 x10 6/uL      MCV 89.0 fL      MCH 30.3 pg      MCHC 34.1 g/dL      RDW 16 %      MPV 11.5 fL      Neutrophils 79.1 %      Lymphocytes Automated 13.5 %      Monocytes 6.7 %      Eosinophils Automated 0.0 %      Basophils Automated 0.3 %      Immature Granulocytes 0.4 %      Nucleated RBC 0.0 /100 WBC      Neutrophils Absolute 11.49 x10 3/uL      Lymphocytes Absolute Automated 1.96 x10 3/uL      Monocytes Absolute Automated 0.97 x10 3/uL      Eosinophils Absolute Automated 0.00 x10 3/uL      Basophils Absolute Automated 0.05 x10 3/uL       Immature Granulocytes Absolute 0.06 x10 3/uL      Absolute NRBC 0.00 x10 3/uL     Urine Osmolality [[id number]]  (Abnormal) Collected: 11/01/20 1148    Specimen: Urine Updated: 11/01/20 1534     Urine Osmolality 1,168 mosm/kg     Narrative:      Rescheduled by [id number] at 11/01/2020 06:59 Reason: Patient unavailable/off   nursing unit.    Urine Sodium Random [[id number]] Collected: 11/01/20 1148    Specimen: Urine Updated: 11/01/20 1518     Urine Sodium Random 49 mEq/L     Narrative:      Rescheduled by [id number] at 11/01/2020 06:59 Reason: Patient unavailable/off   nursing unit.    Urine Creatinine Random [[id number]] Collected: 11/01/20 1148    Specimen: Urine Updated: 11/01/20 1518     Urine Creatinine, Random 477.1 mg/dL     Narrative:      Rescheduled by [id number] at 11/01/2020 06:59 Reason: Patient unavailable/off   nursing unit.    Urinalysis Reflex to Microscopic Exam [[id number]]  (Abnormal) Collected: 11/01/20 1148    Specimen: Urine Updated: 11/01/20 1349     Urine Type Clean Catch     Color, UA Yellow     Clarity, UA Clear     Specific Gravity UA 1.034     Urine pH 5.0     Leukocyte Esterase, UA Negative     Nitrite, UA Negative     Protein, UR 30     Glucose, UA Negative     Ketones UA 20     Urobilinogen, UA Normal mg/dL      Bilirubin, UA Negative     Blood, UA Negative     RBC, UA 0 - 2 /hpf      WBC, UA 0 - 5 /hpf      Squamous Epithelial Cells, Urine 0 - 5 /hpf     Narrative:      Rescheduled by [id number] at 11/01/2020 06:59 Reason: Patient unavailable/off   nursing unit.    Basic Metabolic Panel [[id number]]  (Abnormal) Collected: 11/01/20 0939    Specimen: Blood Updated: 11/01/20 1119     Glucose 89 mg/dL      BUN 32.9 mg/dL  Creatinine 1.9 mg/dL      Calcium 9.2 mg/dL      Sodium 161 mEq/L      Potassium 4.1 mEq/L      Chloride 106 mEq/L      CO2 20 mEq/L      Anion Gap 14.0    Narrative:      Rescheduled by [id number] at 11/01/2020 06:59 Reason: Patient unavailable/off   nursing unit.  Rescheduled by [phone] at  11/01/2020 09:00 Reason: Unable to Scan   Armband/label - use downtime procedure to collect.    GFR [[id number]] Collected: 11/01/20 0939     Updated: 11/01/20 1119     EGFR 50.9    Narrative:      Rescheduled by [id number] at 11/01/2020 06:59 Reason: Patient unavailable/off   nursing unit.  Rescheduled by [phone] at 11/01/2020 09:00 Reason: Unable to Scan   Armband/label - use downtime procedure to collect.    Creatine Kinase (CK) [[id number]]  (Abnormal) Collected: 11/01/20 0939     Updated: 11/01/20 1119     Creatine Kinase (CK) 1,039 U/L     Narrative:      Rescheduled by [id number] at 11/01/2020 06:59 Reason: Patient unavailable/off   nursing unit.  Rescheduled by [phone] at 11/01/2020 09:00 Reason: Unable to Scan   Armband/label - use downtime procedure to collect.    CBC and differential [[id number]]  (Abnormal) Collected: 11/01/20 0939    Specimen: Blood Updated: 11/01/20 1003     WBC 14.30 x10 3/uL      Hgb 14.4 g/dL      Hematocrit 03.4 %      Platelets 245 x10 3/uL      RBC 4.79 x10 6/uL      MCV 89.4 fL      MCH 30.1 pg      MCHC 33.6 g/dL      RDW 16 %      MPV 11.8 fL      Neutrophils 73.1 %      Lymphocytes Automated 18.3 %      Monocytes 8.0 %      Eosinophils Automated 0.0 %      Basophils Automated 0.2 %      Immature Granulocytes 0.4 %      Nucleated RBC 0.0 /100 WBC      Neutrophils Absolute 10.46 x10 3/uL      Lymphocytes Absolute Automated 2.61 x10 3/uL      Monocytes Absolute Automated 1.14 x10 3/uL      Eosinophils Absolute Automated 0.00 x10 3/uL      Basophils Absolute Automated 0.03 x10 3/uL      Immature Granulocytes Absolute 0.06 x10 3/uL      Absolute NRBC 0.00 x10 3/uL     Narrative:      Rescheduled by [id number] at 11/01/2020 06:59 Reason: Patient unavailable/off   nursing unit.  Rescheduled by [phone] at 11/01/2020 09:00 Reason: Unable to Scan   Armband/label - use downtime procedure to collect.    Lactic Acid [[id number]] Collected: 11/01/20 0939    Specimen: Blood Updated: 11/01/20 1003     Lactic Acid  1.1 mmol/L     Narrative:      Rescheduled by [id number] at 11/01/2020 06:59 Reason: Patient unavailable/off   nursing unit.  Rescheduled by [phone] at 11/01/2020 09:00 Reason: Unable to Scan   Armband/label - use downtime procedure to collect.    B-type Natriuretic Peptide [[id number]] Collected: 10/31/20 2318    Specimen: Blood Updated: 11/01/20 5573  B-Natriuretic Peptide 27 pg/mL     Troponin I [[id number]] Collected: 10/31/20 2318    Specimen: Blood Updated: 11/01/20 0233     Troponin I 0.05 ng/mL     Basic Metabolic Panel [[id number]]  (Abnormal) Collected: 10/31/20 2318    Specimen: Blood Updated: 11/01/20 0006     Glucose 87 mg/dL      BUN 69.6 mg/dL      Creatinine 2.6 mg/dL      Calcium 29.5 mg/dL      Sodium 284 mEq/L      Potassium 3.1 mEq/L      Chloride 99 mEq/L      CO2 16 mEq/L      Anion Gap 25.0    Hepatic function panel (LFT) [[id number]]  (Abnormal) Collected: 10/31/20 2318    Specimen: Blood Updated: 11/01/20 0006     Bilirubin, Total 1.4 mg/dL      Bilirubin Direct 0.5 mg/dL      Bilirubin Indirect 0.9 mg/dL      AST (SGOT) 31 U/L      ALT 16 U/L      Alkaline Phosphatase 66 U/L      Protein, Total 9.8 g/dL      Albumin 5.8 g/dL      Globulin 4.0 g/dL      Albumin/Globulin Ratio 1.5    Lipase [[id number]] Collected: 10/31/20 2318    Specimen: Blood Updated: 11/01/20 0006     Lipase 22 U/L     GFR [[id number]] Collected: 10/31/20 2318     Updated: 11/01/20 0006     EGFR 35.4    CBC and differential [[id number]]  (Abnormal) Collected: 10/31/20 2318    Specimen: Blood Updated: 10/31/20 2344     WBC 16.37 x10 3/uL      Hgb 16.9 g/dL      Hematocrit 95.1 %      Platelets 300 x10 3/uL      RBC 5.54 x10 6/uL      MCV 87.7 fL      MCH 30.5 pg      MCHC 34.8 g/dL      RDW 16 %      MPV 12.2 fL      Neutrophils 74.0 %      Lymphocytes Automated 18.0 %      Monocytes 7.3 %      Eosinophils Automated 0.0 %      Basophils Automated 0.2 %      Immature Granulocytes 0.5 %      Nucleated RBC 0.0 /100 WBC      Neutrophils  Absolute 12.11 x10 3/uL      Lymphocytes Absolute Automated 2.94 x10 3/uL      Monocytes Absolute Automated 1.20 x10 3/uL      Eosinophils Absolute Automated 0.00 x10 3/uL      Basophils Absolute Automated 0.04 x10 3/uL      Immature Granulocytes Absolute 0.08 x10 3/uL      Absolute NRBC 0.00 x10 3/uL           Procedures/Radiology performed:   Radiology: all results in the last 7 days  CT Abd/Pelvis without Contrast    Result Date: 11/01/2020  No acute intraabdominal or pelvic pathology. Debera Lat Merchant  11/01/2020 3:59 AM    XR Chest  AP Portable    Result Date: 10/31/2020  Minimal hyperinflation. Otherwise unremarkable. Wilmon Pali, MD  10/31/2020 11:59 PM         Consultations:     Treatment Team:  Attending Provider: Lesia Sago, MD      Discharge Medications:        Discharge Medication List      Taking    capsaicin 0.025 % cream  Commonly known as: ZOSTRIX  Apply topically 3 (three) times daily     famotidine 20 MG tablet  Commonly known as: PEPCID  For: Gastroesophageal Reflux Disease  Use twice daily for 7 days and then twice daily only as needed     ondansetron 4 MG disintegrating tablet  Dose: 4 mg  Commonly known as: ZOFRAN-ODT  For: Nausea and Vomiting  Take 1 tablet (4 mg total) by mouth every 6 (six) hours as needed for Nausea     sertraline 25 MG tablet  Dose: 25 mg  Commonly known as: ZOLOFT  Take 1 tablet (25 mg total) by mouth daily     traZODone 50 MG tablet  Dose: 50 mg  Commonly known as: DESYREL  Take 1 tablet (50 mg total) by mouth nightly                [facility]   Department of Medicine   P: [phone]   F: [phone]    Signed by: Lenard Galloway, NP, NP    CC: Pcp, None, MD

## 2020-11-02 NOTE — Plan of Care (Signed)
Problem: Safety  Goal: Patient will be free from injury during hospitalization  Outcome: Completed  Goal: Patient will be free from infection during hospitalization  Outcome: Completed     Problem: Pain  Goal: Pain at adequate level as identified by patient  Outcome: Completed     Problem: Side Effects from Pain Analgesia  Goal: Patient will experience minimal side effects of analgesic therapy  Outcome: Completed     Problem: Discharge Barriers  Goal: Patient will be discharged home or other facility with appropriate resources  Outcome: Completed     Problem: Psychosocial and Spiritual Needs  Goal: Demonstrates ability to cope with hospitalization/illness  Outcome: Completed     Problem: Moderate/High Fall Risk Score >5  Goal: Patient will remain free of falls  Outcome: Completed

## 2020-11-02 NOTE — UM Notes (Signed)
OBS CSR  11/02/20      MEDICINE DISCHARGE SUMMARY   Pt  was admitted for n/v 2/2 marijuana use, also found to have AKI 2/2 dehydration.   Admitted for further evaluation and treatment.treated supportively w/ antiemetics, IVFs. urine drug + marijuana.Labs improved with fluids and patient's symptoms improved enough to be able to tolerate a diet on day of discharge. Of note, qtc is prolonged but this is 2/2 RBBB.  Symptoms improved and he was cleared for discharge on 3/6.    Outpatient follow up issues:  - continued encouragement of marijuana cessation    VSS    Labs notable for wbc 14.53,  Bun 31, cr 1.9, c02 20, anion gap 16    Pt has been d/c at the time of this review

## 2021-04-26 ENCOUNTER — Observation Stay: Payer: Medicaid Managed Care Other

## 2021-04-26 ENCOUNTER — Observation Stay
Admission: EM | Admit: 2021-04-26 | Discharge: 2021-04-27 | Disposition: A | Discharge status: Home / Self Care | Payer: Medicaid Managed Care Other | Attending: Hospitalist | Admitting: Hospitalist

## 2021-04-26 DIAGNOSIS — R112 Nausea with vomiting, unspecified: Secondary | ICD-10-CM | POA: Diagnosis present

## 2021-04-26 DIAGNOSIS — F1210 Cannabis abuse, uncomplicated: Secondary | ICD-10-CM | POA: Insufficient documentation

## 2021-04-26 DIAGNOSIS — Z79899 Other long term (current) drug therapy: Secondary | ICD-10-CM | POA: Insufficient documentation

## 2021-04-26 DIAGNOSIS — N183 Chronic kidney disease, stage 3 unspecified: Secondary | ICD-10-CM | POA: Insufficient documentation

## 2021-04-26 DIAGNOSIS — E871 Hypo-osmolality and hyponatremia: Secondary | ICD-10-CM | POA: Insufficient documentation

## 2021-04-26 DIAGNOSIS — E872 Acidosis: Secondary | ICD-10-CM | POA: Insufficient documentation

## 2021-04-26 DIAGNOSIS — I4510 Unspecified right bundle-branch block: Secondary | ICD-10-CM | POA: Insufficient documentation

## 2021-04-26 DIAGNOSIS — K759 Inflammatory liver disease, unspecified: Secondary | ICD-10-CM | POA: Diagnosis present

## 2021-04-26 DIAGNOSIS — F32A Depression, unspecified: Secondary | ICD-10-CM | POA: Diagnosis present

## 2021-04-26 DIAGNOSIS — N170 Acute kidney failure with tubular necrosis: Secondary | ICD-10-CM | POA: Diagnosis present

## 2021-04-26 DIAGNOSIS — E8729 Other acidosis: Secondary | ICD-10-CM | POA: Diagnosis present

## 2021-04-26 DIAGNOSIS — T730XXA Starvation, initial encounter: Secondary | ICD-10-CM

## 2021-04-26 DIAGNOSIS — E860 Dehydration: Secondary | ICD-10-CM | POA: Insufficient documentation

## 2021-04-26 DIAGNOSIS — R0602 Shortness of breath: Secondary | ICD-10-CM

## 2021-04-26 DIAGNOSIS — N179 Acute kidney failure, unspecified: Principal | ICD-10-CM | POA: Insufficient documentation

## 2021-04-26 DIAGNOSIS — F419 Anxiety disorder, unspecified: Secondary | ICD-10-CM | POA: Insufficient documentation

## 2021-04-26 DIAGNOSIS — E861 Hypovolemia: Secondary | ICD-10-CM | POA: Insufficient documentation

## 2021-04-26 DIAGNOSIS — F319 Bipolar disorder, unspecified: Secondary | ICD-10-CM | POA: Insufficient documentation

## 2021-04-26 DIAGNOSIS — R109 Unspecified abdominal pain: Secondary | ICD-10-CM

## 2021-04-26 DIAGNOSIS — F1290 Cannabis use, unspecified, uncomplicated: Secondary | ICD-10-CM

## 2021-04-26 DIAGNOSIS — F1010 Alcohol abuse, uncomplicated: Secondary | ICD-10-CM | POA: Insufficient documentation

## 2021-04-26 LAB — URINALYSIS, REFLEX TO MICROSCOPIC EXAM IF INDICATED
Bilirubin, UA: NEGATIVE
Glucose, UA: NEGATIVE
Leukocyte Esterase, UA: NEGATIVE
Nitrite, UA: NEGATIVE
Specific Gravity UA: 1.021 (ref 1.001–1.035)
Urine pH: 6 (ref 5.0–8.0)
Urobilinogen, UA: NORMAL mg/dL (ref 0.2–2.0)

## 2021-04-26 LAB — RAPID DRUG SCREEN, URINE
Barbiturate Screen, UR: NEGATIVE
Benzodiazepine Screen, UR: NEGATIVE
Cannabinoid Screen, UR: POSITIVE — AB
Cocaine, UR: NEGATIVE
Opiate Screen, UR: NEGATIVE
PCP Screen, UR: NEGATIVE
Urine Amphetamine Screen: NEGATIVE

## 2021-04-26 LAB — COMPREHENSIVE METABOLIC PANEL
ALT: 84 U/L — ABNORMAL HIGH (ref 0–55)
AST (SGOT): 331 U/L — ABNORMAL HIGH (ref 5–34)
Albumin/Globulin Ratio: 1.5 (ref 0.9–2.2)
Albumin: 6.1 g/dL — ABNORMAL HIGH (ref 3.5–5.0)
Alkaline Phosphatase: 67 U/L (ref 37–117)
Anion Gap: 20 — ABNORMAL HIGH (ref 5.0–15.0)
BUN: 43 mg/dL — ABNORMAL HIGH (ref 9.0–28.0)
Bilirubin, Total: 1 mg/dL (ref 0.2–1.2)
CO2: 19 mEq/L — ABNORMAL LOW (ref 22–29)
Calcium: 11.1 mg/dL — ABNORMAL HIGH (ref 8.5–10.5)
Chloride: 93 mEq/L — ABNORMAL LOW (ref 100–111)
Creatinine: 3.5 mg/dL — ABNORMAL HIGH (ref 0.7–1.3)
Globulin: 4 g/dL — ABNORMAL HIGH (ref 2.0–3.6)
Glucose: 98 mg/dL (ref 70–100)
Potassium: 3.9 mEq/L (ref 3.5–5.1)
Protein, Total: 10.1 g/dL — ABNORMAL HIGH (ref 6.0–8.3)
Sodium: 132 mEq/L — ABNORMAL LOW (ref 136–145)

## 2021-04-26 LAB — PT/INR
PT INR: 1.2 — ABNORMAL HIGH (ref 0.9–1.1)
PT: 13.6 s — ABNORMAL HIGH (ref 10.1–12.9)

## 2021-04-26 LAB — CBC AND DIFFERENTIAL
Absolute NRBC: 0 10*3/uL (ref 0.00–0.00)
Basophils Absolute Automated: 0.03 10*3/uL (ref 0.00–0.08)
Basophils Automated: 0.4 %
Eosinophils Absolute Automated: 0.16 10*3/uL (ref 0.00–0.44)
Eosinophils Automated: 2.3 %
Hematocrit: 53.1 % — ABNORMAL HIGH (ref 37.6–49.6)
Hgb: 18.7 g/dL — ABNORMAL HIGH (ref 12.5–17.1)
Immature Granulocytes Absolute: 0.02 10*3/uL (ref 0.00–0.07)
Immature Granulocytes: 0.3 %
Lymphocytes Absolute Automated: 0.41 10*3/uL — ABNORMAL LOW (ref 0.42–3.22)
Lymphocytes Automated: 5.8 %
MCH: 30.9 pg (ref 25.1–33.5)
MCHC: 35.2 g/dL (ref 31.5–35.8)
MCV: 87.8 fL (ref 78.0–96.0)
MPV: 12.5 fL (ref 8.9–12.5)
Monocytes Absolute Automated: 1.38 10*3/uL — ABNORMAL HIGH (ref 0.21–0.85)
Monocytes: 19.7 %
Neutrophils Absolute: 5.01 10*3/uL (ref 1.10–6.33)
Neutrophils: 71.5 %
Nucleated RBC: 0 /100 WBC (ref 0.0–0.0)
Platelets: 208 10*3/uL (ref 142–346)
RBC: 6.05 10*6/uL — ABNORMAL HIGH (ref 4.20–5.90)
RDW: 14 % (ref 11–15)
WBC: 7.01 10*3/uL (ref 3.10–9.50)

## 2021-04-26 LAB — OSMOLALITY: Osmolality: 292 mosm/kg (ref 280–300)

## 2021-04-26 LAB — ECG 12-LEAD
Atrial Rate: 87 {beats}/min
IHS MUSE NARRATIVE AND IMPRESSION: NORMAL
P Axis: 83 degrees
P-R Interval: 126 ms
Q-T Interval: 378 ms
QRS Duration: 124 ms
QTC Calculation (Bezet): 454 ms
R Axis: 89 degrees
T Axis: 72 degrees
Ventricular Rate: 87 {beats}/min

## 2021-04-26 LAB — MAGNESIUM: Magnesium: 2.5 mg/dL (ref 1.6–2.6)

## 2021-04-26 LAB — ETHANOL: Alcohol: NOT DETECTED mg/dL

## 2021-04-26 LAB — LACTIC ACID, PLASMA: Lactic Acid: 1.8 mmol/L (ref 0.2–2.0)

## 2021-04-26 LAB — LIPASE: Lipase: 50 U/L (ref 8–78)

## 2021-04-26 LAB — GFR: EGFR: 25

## 2021-04-26 MED ORDER — TAB-A-VITE/BETA CAROTENE PO TABS
1.0000 | ORAL_TABLET | Freq: Every day | ORAL | Status: DC
Start: 2021-04-26 — End: 2021-04-27
  Administered 2021-04-27: 09:00:00 1 via ORAL
  Filled 2021-04-26: qty 1

## 2021-04-26 MED ORDER — LORAZEPAM 1 MG PO TABS
1.0000 mg | ORAL_TABLET | ORAL | Status: DC | PRN
Start: 2021-04-26 — End: 2021-04-27

## 2021-04-26 MED ORDER — THIAMINE HCL 100 MG/ML IJ SOLN
100.0000 mg | INTRAVENOUS | Status: DC
Start: 2021-04-26 — End: 2021-04-27
  Administered 2021-04-26: 23:00:00 100 mg via INTRAVENOUS
  Filled 2021-04-26 (×2): qty 1

## 2021-04-26 MED ORDER — ONDANSETRON HCL 4 MG/2ML IJ SOLN
4.0000 mg | Freq: Once | INTRAMUSCULAR | Status: AC
Start: 2021-04-26 — End: 2021-04-26
  Administered 2021-04-26: 16:00:00 4 mg via INTRAVENOUS
  Filled 2021-04-26: qty 2

## 2021-04-26 MED ORDER — SERTRALINE HCL 50 MG PO TABS
25.0000 mg | ORAL_TABLET | Freq: Every day | ORAL | Status: DC
Start: 2021-04-26 — End: 2021-04-27
  Administered 2021-04-27: 09:00:00 25 mg via ORAL
  Filled 2021-04-26: qty 1

## 2021-04-26 MED ORDER — ONDANSETRON HCL 4 MG/2ML IJ SOLN
4.0000 mg | Freq: Three times a day (TID) | INTRAMUSCULAR | Status: DC | PRN
Start: 2021-04-26 — End: 2021-04-27
  Administered 2021-04-27: 02:00:00 4 mg via INTRAVENOUS
  Filled 2021-04-26: qty 2

## 2021-04-26 MED ORDER — SODIUM CHLORIDE 0.9 % IV BOLUS
1000.0000 mL | Freq: Once | INTRAVENOUS | Status: AC
Start: 2021-04-26 — End: 2021-04-26
  Administered 2021-04-26: 14:00:00 1000 mL via INTRAVENOUS

## 2021-04-26 MED ORDER — LORAZEPAM 2 MG/ML IJ SOLN
1.0000 mg | Freq: Once | INTRAMUSCULAR | Status: AC
Start: 2021-04-26 — End: 2021-04-26
  Administered 2021-04-26: 16:00:00 1 mg via INTRAVENOUS
  Filled 2021-04-26: qty 1

## 2021-04-26 MED ORDER — SODIUM CHLORIDE 0.9 % IV SOLN
12.5000 mg | Freq: Four times a day (QID) | INTRAVENOUS | Status: DC | PRN
Start: 2021-04-26 — End: 2021-04-27
  Filled 2021-04-26: qty 1

## 2021-04-26 MED ORDER — SODIUM CHLORIDE 0.9 % IV BOLUS
2000.0000 mL | Freq: Once | INTRAVENOUS | Status: AC
Start: 2021-04-26 — End: 2021-04-26
  Administered 2021-04-26: 19:00:00 2000 mL via INTRAVENOUS

## 2021-04-26 MED ORDER — DEXTROSE-SODIUM CHLORIDE 5-0.9 % IV SOLN
INTRAVENOUS | Status: DC
Start: 2021-04-26 — End: 2021-04-27

## 2021-04-26 MED ORDER — FOLIC ACID 1 MG PO TABS
1.0000 mg | ORAL_TABLET | Freq: Every day | ORAL | Status: DC
Start: 2021-04-26 — End: 2021-04-27
  Administered 2021-04-27: 08:00:00 1 mg via ORAL
  Filled 2021-04-26: qty 1

## 2021-04-26 NOTE — Progress Notes (Addendum)
Adult Observation Progress Note        Shift Note: Received patient from outgoing RN, introduced self to patient and updated white board. No pain when left for [place] RUQ at 2003. Refused gown before going down. Gown placed on later after multiple showers.      General: english speaking, VSS, HIGH falls, standby to bathroom  Neuro: AAOx4, drowsy  Cardio: not on tele  Resp: clear on RA  Integ: WDL  MSK: WDL, fatigued  GI: nausea, zofran given x1  GU: WDL  IV Access: none     Events during shift: patient has removed IV twice and so far unable to get another IV, 12 showers in total this shift, states it makes him feel better and calms his nausea. Night techs attempted 2 more times for IV in between patient constantly wanting to get up to go to the shower, unsuccessful.     1610: Day tech attempting to get another IV  0644: day tech attempted twice, unsuccessful. Another tech will attempt.    Total of 7 IV attempts so far, including the one he removed.    BM during shift: no     Pending Orders:  Sitter  IVF  Advance diet as tolerated to REGULAR diet  CIWA - scoring 1/6/7, so not enough to medicate  [place] RUQ- completed and resulted.  CATS consult    Discharge Plan: TBD    Social/Family Visits: mother was present and left at change of shift    POC update: patient

## 2021-04-26 NOTE — Plan of Care (Signed)
Problem: Safety  Goal: Patient will be free from injury during hospitalization  Outcome: Progressing  Flowsheets (Taken 04/26/2021 2013)  Patient will be free from injury during hospitalization:   Assess patient's risk for falls and implement fall prevention plan of care per policy   Provide and maintain safe environment   Use appropriate transfer methods   Ensure appropriate safety devices are available at the bedside   Include patient/ family/ care giver in decisions related to safety   Assess for patients risk for elopement and implement Elopement Risk Plan per policy   Hourly rounding  Goal: Patient will be free from infection during hospitalization  Outcome: Progressing  Flowsheets (Taken 04/26/2021 2013)  Free from Infection during hospitalization:   Assess and monitor for signs and symptoms of infection   Monitor lab/diagnostic results   Monitor all insertion sites (i.e. indwelling lines, tubes, urinary catheters, and drains)   Encourage patient and family to use good hand hygiene technique     Problem: Pain  Goal: Pain at adequate level as identified by patient  Outcome: Progressing  Flowsheets (Taken 04/26/2021 2013)  Pain at adequate level as identified by patient:   Identify patient comfort function goal   Assess for risk of opioid induced respiratory depression, including snoring/sleep apnea. Alert healthcare team of risk factors identified.   Assess pain on admission, during daily assessment and/or before any "as needed" intervention(s)   Reassess pain within 30-60 minutes of any procedure/intervention, per Pain Assessment, Intervention, Reassessment (AIR) Cycle   Evaluate if patient comfort function goal is met   Evaluate patient's satisfaction with pain management progress   Offer non-pharmacological pain management interventions   Include patient/patient care companion in decisions related to pain management as needed     Problem: Side Effects from Pain Analgesia  Goal: Patient will experience minimal  side effects of analgesic therapy  Outcome: Progressing  Flowsheets (Taken 04/26/2021 2013)  Patient will experience minimal side effects of analgesic therapy:   Monitor/assess patient's respiratory status (RR depth, effort, breath sounds)   Assess for changes in cognitive function   Evaluate for opioid-induced sedation with appropriate assessment tool (i.e. POSS)   Prevent/manage side effects per LIP orders (i.e. nausea, vomiting, pruritus, constipation, urinary retention, etc.)     Problem: Discharge Barriers  Goal: Patient will be discharged home or other facility with appropriate resources  Outcome: Progressing  Flowsheets (Taken 04/26/2021 2013)  Discharge to home or other facility with appropriate resources:   Provide appropriate patient education   Provide information on available health resources   Initiate discharge planning     Problem: Psychosocial and Spiritual Needs  Goal: Demonstrates ability to cope with hospitalization/illness  Outcome: Progressing  Flowsheets (Taken 04/26/2021 2013)  Demonstrates ability to cope with hospitalizations/illness:   Encourage verbalization of feelings/concerns/expectations   Provide quiet environment   Assist patient to identify own strengths and abilities   Encourage patient to set small goals for self   Encourage participation in diversional activity   Include patient/ patient care companion in decisions   Reinforce positive adaptation of new coping behaviors     Problem: Compromised Tissue integrity  Goal: Damaged tissue is healing and protected  Outcome: Progressing  Flowsheets (Taken 04/26/2021 2013)  Damaged tissue is healing and protected:   Monitor/assess Braden scale every shift   Increase activity as tolerated/progressive mobility   Relieve pressure to bony prominences for patients at moderate and high risk   Keep intact skin clean and dry   Avoid  shearing injuries   Use incontinence wipes for cleaning urine, stool and caustic drainage. Foley care as needed    Monitor patient's hygiene practices  Goal: Nutritional status is improving  Outcome: Progressing  Flowsheets (Taken 04/26/2021 2013)  Nutritional status is improving:   Allow adequate time for meals   Encourage patient to take dietary supplement(s) as ordered   Include patient/patient care companion in decisions related to nutrition     Problem: Moderate/High Fall Risk Score >5  Goal: Patient will remain free of falls  Outcome: Progressing  Flowsheets (Taken 04/26/2021 2000)  High (Greater than 13):   HIGH-Consider use of low bed   HIGH-Initiate use of floor mats as appropriate   HIGH-Apply yellow "Fall Risk" arm band   HIGH-Utilize chair pad alarm for patient while in the chair   HIGH-Bed alarm on at all times while patient in bed   HIGH-Visual cue at entrance to patient's room     Problem: Altered GI Function  Goal: Fluid and electrolyte balance are achieved/maintained  Outcome: Progressing  Flowsheets (Taken 04/26/2021 2013)  Fluid and electrolyte balance are achieved/maintained:   Monitor/assess lab values and report abnormal values   Provide adequate hydration   Assess for confusion/personality changes   Assess and reassess fluid and electrolyte status   Monitor for muscle weakness  Goal: Elimination patterns are normal or improving  Outcome: Progressing  Flowsheets (Taken 04/26/2021 2013)  Elimination patterns are normal or improving:   Report abnormal assessment to physician   Anticipate/assist with toileting needs   Assess for normal bowel sounds   Monitor for abdominal distension   Monitor for abdominal discomfort   Assess for signs and symptoms of bleeding.  Report signs of bleeding to physician   Administer treatments as ordered   Consult/collaborate with Clinical Nutritionist   Assess for flatus   Assess for and discuss C. diff screening with LIP   Collaborate with LIP for containment device   Reinforce education on foods that improve and complicate bowel movements and how activity and medications can affect  bowel movements   Administer medications to improve bowel evacuation as prescribed   Encourage /perform oral hygiene as appropriate  Goal: Nutritional intake is adequate  Outcome: Progressing  Flowsheets (Taken 04/26/2021 2013)  Nutritional intake is adequate:   Allow adequate time for meals   Encourage/perform oral hygiene as appropriate   Include patient/patient care companion in decisions related to nutrition   Assess anorexia, appetite, and amount of meal/food tolerated  Goal: Mobility/Activity is maintained at optimal level for patient  Outcome: Progressing  Flowsheets (Taken 04/26/2021 2013)  Mobility/activity is maintained at optimal level for patient:   Maintain proper body alignment   Increase mobility as tolerated/progressive mobility   Encourage independent activity per ability   Perform active/passive ROM   Assess for changes in respiratory status, level of consciousness and/or development of fatigue  Goal: No bleeding  Outcome: Progressing  Flowsheets (Taken 04/26/2021 2013)  No bleeding:   Monitor and assess vitals and hemodynamic parameters   Assess for bruising/petechia   Monitor/assess lab values and report abnormal values
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
## 2021-04-26 NOTE — ED Provider Notes (Signed)
Triage MD Note:  This patient has been seen by me or placed for labs/radiology studies per request of the Triage RN staff. I am not the primary provider.  Judi Saa, M.D.   1:19 PM         Salley Scarlet Hoy Finlay, MD  04/26/21 [phone]
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
## 2021-04-26 NOTE — ED Triage Notes (Signed)
Pt states he is suffering from cannabis hyperemesis. Has been experiencing N/V since Friday w/ diffuse abdominal pain/cramping. Pt also experiencing body aches and chills. States he has not been able to tolerate anything PO.     A&Ox4, follows commands, answers questions appropriately.

## 2021-04-26 NOTE — ED Notes (Signed)
[facility] EMERGENCY DEPT  ED NURSING NOTE FOR THE RECEIVING INPATIENT NURSE   ED NURSE Travon Crochet RN   [facility] [phone]   ED CHARGE RN Toni Amend RN   ADMISSION INFORMATION   Joel [name]. is a 30 y.o. male admitted with an ED diagnosis of:    1. AKI (acute kidney injury)         Isolation: None   Allergies: Pollen extract   Holding Orders confirmed? N/A   Belongings Documented? W/ mother at bedside   Home medications sent to pharmacy confirmed? N/A   NURSING CARE   Patient Comes From:   Mental Status: Home Independent  alert and oriented   ADL: Independent with all ADLs   Ambulation: no difficulty   Pertinent Information  and Safety Concerns: Pt states he is suffering from cannabis hyperemesis. Has been experiencing N/V since Friday w/ diffuse abdominal pain/cramping. Pt also experiencing body aches and chills. States he has not been able to tolerate anything PO     CT / NIH   CT Head ordered on this patient?  No   NIH/Dysphagia assessment done prior to admission? N/A   VITAL SIGNS (at the time of this note)      Vitals:    04/26/21 1602   BP: 109/85   Pulse: 100   Resp: 16   Temp:    SpO2: 99%

## 2021-04-27 DIAGNOSIS — N170 Acute kidney failure with tubular necrosis: Secondary | ICD-10-CM

## 2021-04-27 LAB — COMPREHENSIVE METABOLIC PANEL
ALT: 76 U/L — ABNORMAL HIGH (ref 0–55)
AST (SGOT): 324 U/L — ABNORMAL HIGH (ref 5–34)
Albumin/Globulin Ratio: 1.3 (ref 0.9–2.2)
Albumin: 4.2 g/dL (ref 3.5–5.0)
Alkaline Phosphatase: 49 U/L (ref 37–117)
Anion Gap: 12 (ref 5.0–15.0)
BUN: 39 mg/dL — ABNORMAL HIGH (ref 9.0–28.0)
Bilirubin, Total: 0.7 mg/dL (ref 0.2–1.2)
CO2: 19 mEq/L — ABNORMAL LOW (ref 22–29)
Calcium: 8.9 mg/dL (ref 8.5–10.5)
Chloride: 104 mEq/L (ref 100–111)
Creatinine: 1.9 mg/dL — ABNORMAL HIGH (ref 0.7–1.3)
Globulin: 3.3 g/dL (ref 2.0–3.6)
Glucose: 93 mg/dL (ref 70–100)
Potassium: 3.8 mEq/L (ref 3.5–5.1)
Protein, Total: 7.5 g/dL (ref 6.0–8.3)
Sodium: 135 mEq/L — ABNORMAL LOW (ref 136–145)

## 2021-04-27 LAB — CBC
Absolute NRBC: 0 10*3/uL (ref 0.00–0.00)
Hematocrit: 48.2 % (ref 37.6–49.6)
Hgb: 17.1 g/dL (ref 12.5–17.1)
MCH: 31.2 pg (ref 25.1–33.5)
MCHC: 35.5 g/dL (ref 31.5–35.8)
MCV: 88 fL (ref 78.0–96.0)
MPV: 11.9 fL (ref 8.9–12.5)
Nucleated RBC: 0 /100 WBC (ref 0.0–0.0)
Platelets: 150 10*3/uL (ref 142–346)
RBC: 5.48 10*6/uL (ref 4.20–5.90)
RDW: 15 % (ref 11–15)
WBC: 6.18 10*3/uL (ref 3.10–9.50)

## 2021-04-27 LAB — HIV-1/2 AG/AB 4TH GEN. W/ REFLEX: HIV Ag/Ab, 4th Generation: NONREACTIVE

## 2021-04-27 LAB — HEPATITIS PANEL, ACUTE
Hep A IgM: NONREACTIVE
Hepatitis B Core IgM: NONREACTIVE
Hepatitis B Surface Antigen: NONREACTIVE
Hepatitis C, AB: NONREACTIVE

## 2021-04-27 LAB — GFR: EGFR: 50.7

## 2021-04-27 MED ORDER — ONDANSETRON HCL 4 MG PO TABS
4.0000 mg | ORAL_TABLET | Freq: Three times a day (TID) | ORAL | Status: DC | PRN
Start: 2021-04-27 — End: 2021-04-27
  Administered 2021-04-27: 09:00:00 4 mg via ORAL
  Filled 2021-04-27: qty 1

## 2021-04-27 MED ORDER — ONDANSETRON HCL 4 MG PO TABS
4.0000 mg | ORAL_TABLET | Freq: Three times a day (TID) | ORAL | 0 refills | Status: AC | PRN
Start: 2021-04-27 — End: ?

## 2021-04-27 MED ORDER — CAPSAICIN 0.025 % EX CREA
TOPICAL_CREAM | Freq: Four times a day (QID) | CUTANEOUS | Status: DC | PRN
Start: 2021-04-27 — End: 2021-04-27
  Filled 2021-04-27: qty 60

## 2021-04-27 MED ORDER — CAPSAICIN 0.025 % EX CREA
TOPICAL_CREAM | Freq: Four times a day (QID) | CUTANEOUS | Status: AC | PRN
Start: 2021-04-27 — End: ?

## 2021-04-27 NOTE — Discharge Instr - AVS First Page (Addendum)
Reason for your Hospital Admission:  Nausea and Vomiting  Cannabinoid hyperemesis syndrome  Dehydration       Instructions for after your discharge:  -- You will need to see a primary care provider within the next 4-7 days for an after hospital visit. If you do not have one, we have provided information to the hospital's [facility]. Please call to make a next available appointment   >> During this visit, we recommend that you have blood work done to ensure your electrolytes, kidney function, and liver function are stable  -- Follow up with nephrology (kidney specialist) because you have chronic kidney disease and need close follow up to prevent this from getting worse and to hopefully avoid the need for a kidney transplant in the future.   -- Establish care with a psychiatrist to help you with your marijuana usage.   -- Avoid products with NSAIDs (iburprofen, motrin, aleve, toradol, excedrin etc) as these are process through the kidneys.  -- Stay well hydrated the next several days. In addition to water, you should rink fluids that contain electrolytes such as pedialyte or gatorade  -- START taking Zofran as needed for nausea  -- START using capsaicin cream. Apply to your abdomen as this medication as shown some help with recovering from marijuana induced nausea and vomiting.  -- Diet: for the next several days, stick to a soft and bland diet. Avoid foods that are very salty, spicy, or acidic. You should also avoid alcohol until you are feeling back to normal     **  We strongly recommend that you stop using any marijuana or cannabis containing products (smoking or ingesting). Your episodes of vomiting and pain are thought due to ongoing use of these products. Stopping will help reduce your symptoms and reduce the amount of times you have to go to the hospital or emergency department and causing further damage to your kidneys**    Return to the nearest emergency department for chest pain, shortness of breath,  fever >100.5 degrees, dizziness, syncope, profuse vomiting or diarrhea, or any other concerning symptoms.

## 2021-04-27 NOTE — Consults (Signed)
CATS note:    Dx.: Cannabinoid hyperemesis Syndrome    Patient discharge prior to writer able to see pt. For Marijuana abuse today.    Dara Lords, DNP,CFNP  [phone] Spectra [phone] until 4:30 pm  Or Dr. Richardson Chiquito,  F. Pager [phone]  Or Dr. Derryl Harbor, Rexene Edison. Pager [phone]

## 2021-04-27 NOTE — UM Notes (Signed)
54 M with hx of cannabis hyperemesis syndrome here with n/v for days.  Last mariajuana use Thurs.  He denies current EtOH use or other substance use but   Was at [facility] and [facility] for similar over past 48 hours (separate admissions).       Is in acute renal failure with probable component of ATN given granaular casts.  Gap acidosis noted likely due to starvation as lactate neg and has a normal osmolar gap.  LFTs up in with AST>ALT likely from alcoholic hepatitis. He appears hemoconcentrated as with elev hgb/hct, ca, albumin.  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     PLAN- ADMIT TO OBSERVATION CLASS 04/26/2021 1656    OBS for now  IVF  Antiemetics.  CIWA.   Thiamine.   Cont Zoloft.  Follow labs  Check hepatitis panel/HIV.      Adult Admit to Observation (Order #[id number]) on 04/26/21    Vomiting: Observation Care - Observation Care Admission Criteria    UTILIZATION REVIEW CONTACT: Name: PAM Morgana Rowley  Utilization Review  [facility]  Address:  [address] [place], [place]  [address]  NPI:   [id number]  Tax ID:  [id number]  Phone: [phone] voice mail only  Fax: [phone]    Please use fax number [phone] to provide authorization for hospital services or to request additional information.        NOTES TO REVIEWER:     This clinical review is based on/compiled from documentation provided by the treatment team within the patient's medical record.

## 2021-04-27 NOTE — Plan of Care (Signed)
Adult Observation Progress Note    Shift Note: Received report from outgoing RN, introduced self to patient, and updated white board. Sitter present at bedside. Pt continuously sits in shower.   General: VSS, Pt denies any pain at this time, 0/10.  Neuro: A&Ox4. No neuro deficits.  Cardio: Tele refused. Denies chest pain.   Resp: Clear on RA  Integ: Intact  MSK: High Fall level, safety precautions are in place. Pt ambulates with standby assist.  GI: Pt with continued nausea. States showering helps relieve the nausea. Pt expresses that he would like to continue on liquid diet, not ready to advance   GU: Continent.  IV Access: No access at this time.      BM during shift: NO    Pending Orders:  -prn PO zofran  -prn capsaicin ointment    Discharge Plan: TBD, Pending MD Clearance.    Social/Family Visits: Spoke with pts mother on phone, updated on plan of care.     POC update: Pt was updated and verbalized understanding.          Problem: Safety  Goal: Patient will be free from injury during hospitalization  Outcome: Progressing  Flowsheets (Taken 04/27/2021 1036)  Patient will be free from injury during hospitalization:   Assess patient's risk for falls and implement fall prevention plan of care per policy   Provide and maintain safe environment   Use appropriate transfer methods   Ensure appropriate safety devices are available at the bedside   Include patient/ family/ care giver in decisions related to safety   Hourly rounding   Assess for patients risk for elopement and implement Elopement Risk Plan per policy  Goal: Patient will be free from infection during hospitalization  Outcome: Progressing  Flowsheets (Taken 04/27/2021 1036)  Free from Infection during hospitalization:   Assess and monitor for signs and symptoms of infection   Monitor lab/diagnostic results   Encourage patient and family to use good hand hygiene technique     Problem: Pain  Goal: Pain at adequate level as identified by patient  Outcome:  Progressing  Flowsheets (Taken 04/27/2021 1036)  Pain at adequate level as identified by patient:   Identify patient comfort function goal   Assess for risk of opioid induced respiratory depression, including snoring/sleep apnea. Alert healthcare team of risk factors identified.   Assess pain on admission, during daily assessment and/or before any "as needed" intervention(s)   Reassess pain within 30-60 minutes of any procedure/intervention, per Pain Assessment, Intervention, Reassessment (AIR) Cycle   Evaluate if patient comfort function goal is met   Evaluate patient's satisfaction with pain management progress     Problem: Side Effects from Pain Analgesia  Goal: Patient will experience minimal side effects of analgesic therapy  Outcome: Progressing  Flowsheets (Taken 04/27/2021 1036)  Patient will experience minimal side effects of analgesic therapy:   Monitor/assess patient's respiratory status (RR depth, effort, breath sounds)   Assess for changes in cognitive function   Prevent/manage side effects per LIP orders (i.e. nausea, vomiting, pruritus, constipation, urinary retention, etc.)   Evaluate for opioid-induced sedation with appropriate assessment tool (i.e. POSS)     Problem: Discharge Barriers  Goal: Patient will be discharged home or other facility with appropriate resources  Outcome: Progressing  Flowsheets (Taken 04/27/2021 1036)  Discharge to home or other facility with appropriate resources:   Provide appropriate patient education   Provide information on available health resources   Initiate discharge planning     Problem: Psychosocial  and Spiritual Needs  Goal: Demonstrates ability to cope with hospitalization/illness  Outcome: Progressing  Flowsheets (Taken 04/27/2021 1036)  Demonstrates ability to cope with hospitalizations/illness:   Encourage verbalization of feelings/concerns/expectations   Provide quiet environment   Reinforce positive adaptation of new coping behaviors   Include patient/ patient  care companion in decisions     Problem: Compromised Tissue integrity  Goal: Damaged tissue is healing and protected  Outcome: Progressing  Flowsheets (Taken 04/27/2021 1036)  Damaged tissue is healing and protected:   Monitor/assess Braden scale every shift   Keep intact skin clean and dry  Goal: Nutritional status is improving  Outcome: Progressing  Flowsheets (Taken 04/27/2021 1036)  Nutritional status is improving:   Allow adequate time for meals   Include patient/patient care companion in decisions related to nutrition     Problem: Moderate/High Fall Risk Score >5  Goal: Patient will remain free of falls  Outcome: Progressing  Flowsheets (Taken 04/27/2021 1036)  High (Greater than 13):   HIGH-Consider use of low bed   HIGH-Pharmacy to initiate evaluation and intervention per protocol   HIGH-Utilize chair pad alarm for patient while in the chair   HIGH-Bed alarm on at all times while patient in bed   HIGH-Apply yellow "Fall Risk" arm band   HIGH-Initiate use of floor mats as appropriate   HIGH-Visual cue at entrance to patient's room  VH High Risk (Greater than 13):   A safety companion may be used when deemed appropriate by the Primary RN and Clinical Administrator   PATIENT IS TO BE SUPERVISED FOR ALL TOILETING ACTIVITIES   A CHAIR PAD ALARM WILL BE USED WHEN PATIENT IS UP SITTING IN A CHAIR   BED ALARM WILL BE ACTIVATED WHEN THE PATEINT IS IN BED WITH SIGNAGE "RESET BED ALARM"   RED "HIGH FALL RISK" SIGNAGE   ALL REQUIRED MODERATE INTERVENTIONS   ALL REQUIRED LOW INTERVENTIONS   Keep door open for better visibility   Include family/significant other in multidisciplinary discussion regarding plan of care as appropriate   Use chair-pad alarm device   Use of floor mat     Problem: Altered GI Function  Goal: Fluid and electrolyte balance are achieved/maintained  Outcome: Progressing  Flowsheets (Taken 04/27/2021 1036)  Fluid and electrolyte balance are achieved/maintained:   Monitor intake and output every shift    Monitor/assess lab values and report abnormal values   Provide adequate hydration   Assess and reassess fluid and electrolyte status   Assess for confusion/personality changes   Observe for seizure activity and initiate seizure precautions if indicated   Monitor for muscle weakness  Goal: Elimination patterns are normal or improving  Outcome: Progressing  Flowsheets (Taken 04/27/2021 1036)  Elimination patterns are normal or improving:   Report abnormal assessment to physician   Anticipate/assist with toileting needs   Assess for normal bowel sounds   Monitor for abdominal distension   Monitor for abdominal discomfort   Assess for signs and symptoms of bleeding.  Report signs of bleeding to physician   Administer treatments as ordered  Goal: Nutritional intake is adequate  Outcome: Progressing  Flowsheets (Taken 04/27/2021 1036)  Nutritional intake is adequate:   Monitor daily weights   Assist patient with meals/food selection   Allow adequate time for meals   Encourage/perform oral hygiene as appropriate   Assess anorexia, appetite, and amount of meal/food tolerated  Goal: Mobility/Activity is maintained at optimal level for patient  Outcome: Progressing  Flowsheets (Taken 04/27/2021 1036)  Mobility/activity  is maintained at optimal level for patient:   Encourage independent activity per ability   Maintain proper body alignment   Perform active/passive ROM   Increase mobility as tolerated/progressive mobility   Assess for changes in respiratory status, level of consciousness and/or development of fatigue  Goal: No bleeding  Outcome: Progressing  Flowsheets (Taken 04/27/2021 1036)  No bleeding:   Monitor and assess vitals and hemodynamic parameters   Assess for bruising/petechia   Monitor/assess lab values and report abnormal values

## 2021-04-27 NOTE — Progress Notes (Signed)
ADULT OBSERVATION UNIT  DISCHARGE NOTE        Date Time: 04/27/21 11:15  Patient Name: Joel [name]  Attending Physician: Dr. Joni Fears      Date of Admission:   04/26/21        Discharge Instructions:                   Patient stable for discharge. Discharge instructions given, patient teachings done and verbalized understanding. All questions answered at this time. Aware to follow-up and schedule appointments as necessary.     Instructions for after your discharge:  -- You will need to see a primary care provider within the next 4-7 days for an after hospital visit. If you do not have one, we have provided information to the hospital's [facility]. Please call to make a next available appointment   >> During this visit, we recommend that you have blood work done to ensure your electrolytes, kidney function, and liver function are stable  -- Follow up with nephrology (kidney specialist) because you have chronic kidney disease and need close follow up to prevent this from getting worse and to hopefully avoid the need for a kidney transplant in the future.   -- Avoid products with NSAIDs (iburprofen, motrin, aleve, toradol, excedrin etc) as these are process through the kidneys.  -- Stay well hydrated the next several days. In addition to water, you should rink fluids that contain electrolytes such as pedialyte or gatorade  -- START taking Zofran as needed for nausea  -- START using capsaicin cream. Apply to your abdomen as this medication as shown some help with recovering from marijuana induced nausea and vomiting.  -- Diet: for the next several days, stick to a soft and bland diet. Avoid foods that are very salty, spicy, or acidic. You should also avoid alcohol until you are feeling back to normal      IV access and cardiac monitor discontinued. Discharge to home. Wheelchair provided by clinical tech.   New medications brought to patient bedside by inpatient pharmacy.     Patient aware to follow-up with MD and  PMD as needed.     Signed by:    Merlene Pulling

## 2021-04-27 NOTE — Discharge Summary (Signed)
MEDICINE DISCHARGE SUMMARY    Date Time: 04/27/21 12:22 PM  Patient Name: [facility] [name]  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 If you do not have one, we have provided information to the hospital's [facility]. Please call to make a next available appointment   >> During this visit, we recommend that you have blood work done to ensure your electrolytes, kidney function, and liver function are stable  -- Follow up with nephrology (kidney specialist) because you have chronic kidney disease and need close follow up to prevent this from getting worse and to hopefully avoid the need for a kidney transplant in the future.   -- Establish care with a psychiatrist to help you with your marijuana usage.   -- Avoid products with NSAIDs (iburprofen, motrin, aleve, toradol, excedrin etc) as these are process through the kidneys.  -- Stay well hydrated the next several days. In addition to water, you should rink fluids that contain electrolytes such as pedialyte or gatorade  -- START taking Zofran as needed for nausea  -- START using capsaicin cream. Apply to your abdomen as this medication as shown some help with recovering from marijuana  induced nausea and vomiting.  -- Diet: for the next several days, stick to a soft and bland diet. Avoid foods that are very salty, spicy, or acidic. You should also avoid alcohol until you are feeling back to normal     **  We strongly recommend that you stop using any marijuana or cannabis containing products (smoking or ingesting). Your episodes of vomiting and pain are thought due to ongoing use of these products. Stopping will help reduce your symptoms and reduce the amount of times you have to go to the hospital or emergency department and causing further damage to your kidneys**    Return to the nearest emergency department for chest pain, shortness of breath, fever >100.5 degrees, dizziness, syncope, profuse vomiting or diarrhea, or any other concerning symptoms.     Recent Labs:       Results       Procedure Component Value Units Date/Time    Hepatitis Panel, Acute [[id number]] Collected: 04/27/21 0135    Specimen: Blood Updated: 04/27/21 0527     Hepatitis B Surface Antigen Non-Reactive     Hep A IgM Nonreactive     Hepatitis B Core IgM Nonreactive     Hepatitis C, AB Non-Reactive    HIV-1/2 Ag/Ab 4th Gen. w/ Reflex [[id number]] Collected: 04/27/21 0135    Specimen: Blood Updated: 04/27/21 0526     HIV Ag/Ab, 4th Generation Non-Reactive    GFR [  [id number]] Collected: 04/27/21 0135     Updated: 04/27/21 0217     EGFR 50.7    Comprehensive metabolic panel [[id number]]  (Abnormal) Collected: 04/27/21 0135    Specimen: Blood Updated: 04/27/21 0217     Glucose 93 mg/dL      BUN 78.2 mg/dL      Creatinine 1.9 mg/dL      Sodium 956 mEq/L      Potassium 3.8 mEq/L      Chloride 104 mEq/L      CO2 19 mEq/L      Calcium 8.9 mg/dL      Protein, Total 7.5 g/dL      Albumin 4.2 g/dL      AST (SGOT) 213 U/L      ALT 76 U/L      Alkaline Phosphatase 49 U/L      Bilirubin, Total 0.7 mg/dL      Globulin 3.3 g/dL      Albumin/Globulin Ratio 1.3     Anion Gap 12.0    CBC without differential [[id number]] Collected: 04/27/21 0135     Specimen: Blood Updated: 04/27/21 0155     WBC 6.18 x10 3/uL      Hgb 17.1 g/dL      Hematocrit 62.9 %      Platelets 150 x10 3/uL      RBC 5.48 x10 6/uL      MCV 88.0 fL      MCH 31.2 pg      MCHC 35.5 g/dL      RDW 15 %      MPV 11.9 fL      Nucleated RBC 0.0 /100 WBC      Absolute NRBC 0.00 x10 3/uL     Osmolality [[id number]] Collected: 04/26/21 1709    Specimen: Blood Updated: 04/26/21 1934     Osmolality 292 mosm/kg     Urinalysis Reflex to Microscopic Exam [[id number]]  (Abnormal) Collected: 04/26/21 1716    Specimen: Urine Updated: 04/26/21 1815     Urine Type Clean Catch     Color, UA Yellow     Clarity, UA Hazy     Specific Gravity UA 1.021     Urine pH 6.0     Leukocyte Esterase, UA Negative     Nitrite, UA Negative     Protein, UR 100= 2+     Glucose, UA Negative     Ketones UA Trace     Urobilinogen, UA Normal mg/dL      Bilirubin, UA Negative     Blood, UA Large     RBC, UA 0-2 /hpf      WBC, UA 0-5 /hpf      Squamous Epithelial Cells, Urine 0-5 /hpf      Granular Casts, UA 11 - 25 /lpf      Urine Mucus Present    Magnesium [[id number]] Collected: 04/26/21 1709    Specimen: Blood Updated: 04/26/21 1752     Magnesium 2.5 mg/dL     Ethanol (Alcohol) Level [[id number]] Collected: 04/26/21 1709    Specimen: Blood Updated: 04/26/21 1749     Alcohol NONE DETECTED mg/dL     Prothrombin time/INR [[id number]]  (Abnormal) Collected: 04/26/21 1709    Specimen: Blood Updated: 04/26/21 1745     PT 13.6 sec      PT INR 1.2    Rapid drug screen, urine [[id number]]  (Abnormal) Collected: 04/26/21 1716    Specimen: Urine Updated: 04/26/21 1741     Urine Amphetamine  Screen Negative     Barbiturate Screen, UR Negative     Benzodiazepine Screen, UR Negative     Cannabinoid Screen, UR Positive     Cocaine, UR Negative     Opiate Screen, UR Negative     PCP Screen, UR Negative    Lactic Acid [[id number]] Collected: 04/26/21 1709    Specimen: Blood Updated: 04/26/21 1731     Lactic Acid 1.8 mmol/L     Lipase [[id number]] Collected:  04/26/21 1354    Specimen: Blood Updated: 04/26/21 1450     Lipase 50 U/L     GFR [[id number]] Collected: 04/26/21 1354     Updated: 04/26/21 1450     EGFR 25.0    Comprehensive metabolic panel [[id number]]  (Abnormal) Collected: 04/26/21 1354    Specimen: Blood Updated: 04/26/21 1450     Glucose 98 mg/dL      BUN 62.9 mg/dL      Creatinine 3.5 mg/dL      Sodium 528 mEq/L      Potassium 3.9 mEq/L      Chloride 93 mEq/L      CO2 19 mEq/L      Calcium 11.1 mg/dL      Protein, Total 41.3 g/dL      Albumin 6.1 g/dL      AST (SGOT) 244 U/L      ALT 84 U/L      Alkaline Phosphatase 67 U/L      Bilirubin, Total 1.0 mg/dL      Globulin 4.0 g/dL      Albumin/Globulin Ratio 1.5     Anion Gap 20.0    CBC and differential [[id number]]  (Abnormal) Collected: 04/26/21 1354    Specimen: Blood Updated: 04/26/21 1440     WBC 7.01 x10 3/uL      Hgb 18.7 g/dL      Hematocrit 64.4 %      Platelets 208 x10 3/uL      RBC 6.05 x10 6/uL      MCV 87.8 fL      MCH 30.9 pg      MCHC 35.2 g/dL      RDW 14 %      MPV 12.5 fL      Neutrophils 71.5 %      Lymphocytes Automated 5.8 %      Monocytes 19.7 %      Eosinophils Automated 2.3 %      Basophils Automated 0.4 %      Immature Granulocytes 0.3 %      Nucleated RBC 0.0 /100 WBC      Neutrophils Absolute 5.01 x10 3/uL      Lymphocytes Absolute Automated 0.41 x10 3/uL      Monocytes Absolute Automated 1.38 x10 3/uL      Eosinophils Absolute Automated 0.16 x10 3/uL      Basophils Absolute Automated 0.03 x10 3/uL      Immature Granulocytes Absolute 0.02 x10 3/uL      Absolute NRBC 0.00 x10 3/uL             Procedures/Radiology performed:   Radiology: all results from this admission  US Abdomen Limited Ruq    Result Date: 04/26/2021   1. Echogenic right kidney suggesting chronic medical renal disease. Correlation with renal function suggested. 2. Prominent portal triads is nonspecific but can be seen with hepatitis. Will Bonnet, MD  04/26/2021 8:59 PM      Hospital Course:     Reason for admission/  HPI:    30 year old male with a PMHx of  Bipolar disorder, Anxiety, Depression, CKD, Alcohol abuse, Marijuana use with previous hx of cannabis hyperemesis syndrome, RBBB admitted on 04/26/21 with 3-4 days of intractable nausea and vomiting. He was seen at [facility] and [facility] for similar symptoms 48 hours PTA. He endorsed that he last used Marijuana on 04/23/21 and reports hot showers help his symptoms somewhat improve.     Hospital Course:   In the ED, his HR was 128 with otherwise stable VS. EKG was NSR with a RBBB. Initial labs were pertinent for H/H 18.7/53.1, Cr 3.5, BUN 43, Na+ 132, Ca+ 11.1, AST 331, ALT 84, AG 20, UA with trace ketones with large blood. UDS was + for cannabinoid. He was started on IV fluids for hydration and antiemetics. Hepatis panel checked given LFTs and was negative.RUQ US showed echogenic right kidney from chronic medical renal disease and prominent portal triads which is nonspecific but can be seen with hepatitis. AKI, H/H, and electrolytes improved with IV hydration. He has been able to tolerate PO liquids this morning with no further vomiting. He will go home with PRN zofran and capsaicin cream. Discussed with patient and his mother (per patient request) that recommend to cease all forms of cannabinoid and seek resources to help stop this. Mother stated that she has been looking for a rehab/detox program but there are none available for MJ use; suggest establishing care with a psychiatrist who specializes with addiction medicine. Patient will need labs rechecked within one week and should follow up with PCP. Also recommend following up with nephrology given AKI which has since resolve but with ongoing CKD in this young patient.     Discharge Day Exam:  Temp:  [97.5 F (36.4 C)-98.8 F (37.1 C)] 98.2 F (36.8 C)  Heart Rate:  [74-128] 87  Resp Rate:  [15-21] 18  BP: (91-127)/(59-88) 98/61    General: awake, alert, oriented x 3; no acute distress.  HEENT: perrla, eomi, sclera anicteric   oropharynx clear without lesions, mucous membranes moist  Neck: supple, no lymphadenopathy, no thyromegaly, no JVD, no carotid bruits  Cardiovascular: regular rate and rhythm, no murmurs, rubs or gallops  Lungs: clear to auscultation bilaterally, without wheezing, rhonchi, or rales  Abdomen: soft, non-tender, non-distended; no palpable masses, no hepatosplenomegaly, normoactive bowel sounds, no rebound or guarding  Extremities: no clubbing, cyanosis, or edema  Neuro: cranial nerves grossly intact, strength 5/5 in upper and lower extremities, sensation intact,   Skin: no rashes or lesions noted      Consultations:     Treatment Team:   Attending Provider: Jerral Ralph, MD    Discharge Condition:     Stable    Discharge Instructions & Follow Up Plan for Patient:     Diet:Regular    Activity/Weight Bearing Status: As tolerated/Full      Patient was instructed to follow up with:      Follow-up Information       [facility] Follow up.    Specialty: Family Medicine  Why: Follow up (if you do not have a primary care provider)  Contact information:  [address]  [place] [address]  [phone]  Additional information:  From the [place]:   Take [address]. Take exit [address] to merge onto [address] toward [place].    Turn right onto [address]. Your destination will be on the left.  From the [place]:   Take [address] towards [address]. Merge onto [address]. Take exit [address]A to merge onto [address]/   [facility] toward [address]. Turn right onto [address]. Your destination will be on the left.              Glenard Haring, MD Follow up.    Specialties: Nephrology, Internal Medicine  Contact information:  [address]  [place] [address]  [phone]                               Discharge Code Status: Full   Patient Emergency Contact: Extended Emergency Contact Information  Primary Emergency Contact: Nicole [place]  Home Phone: [phone]  Mobile Phone: [phone]  Relation: Mother      Complete instructions and follow up are in the patient's After Visit Summary    Minutes spent coordinating discharge and reviewing discharge plan: 40 minutes    Discharge Medications:        Discharge Medication List        Taking      capsaicin 0.025 % cream  Commonly known as: ZOSTRIX  Apply topically 4 (four) times daily as needed (cannabinoid hyperemsis syndrome)     ondansetron 4 MG tablet  Dose: 4 mg  Commonly known as: ZOFRAN  Take 1 tablet (4 mg total) by mouth every 8 (eight) hours as needed for Nausea     sertraline 25 MG tablet  Dose: 25 mg  Commonly known as: ZOLOFT  Take 1 tablet (25 mg total) by mouth daily                  [facility]  Department of Medicine  P: [phone]  F: [phone]    Discussed with attending physician: Jerral Ralph., MD  Signed by: Brock Bad, FNP    CC: Patsy Lager, MD

## 2021-04-28 ENCOUNTER — Observation Stay
Admission: EM | Admit: 2021-04-28 | Discharge: 2021-04-30 | Disposition: A | Discharge status: Home / Self Care | Payer: Medicaid Managed Care Other | Attending: Internal Medicine | Admitting: Internal Medicine

## 2021-04-28 ENCOUNTER — Emergency Department: Payer: Medicaid Managed Care Other

## 2021-04-28 DIAGNOSIS — A4189 Other specified sepsis: Secondary | ICD-10-CM | POA: Insufficient documentation

## 2021-04-28 DIAGNOSIS — D751 Secondary polycythemia: Secondary | ICD-10-CM | POA: Insufficient documentation

## 2021-04-28 DIAGNOSIS — U071 COVID-19: Principal | ICD-10-CM | POA: Insufficient documentation

## 2021-04-28 DIAGNOSIS — F419 Anxiety disorder, unspecified: Secondary | ICD-10-CM | POA: Insufficient documentation

## 2021-04-28 DIAGNOSIS — R7401 Elevation of levels of liver transaminase levels: Secondary | ICD-10-CM | POA: Insufficient documentation

## 2021-04-28 DIAGNOSIS — Z87891 Personal history of nicotine dependence: Secondary | ICD-10-CM | POA: Insufficient documentation

## 2021-04-28 DIAGNOSIS — Z79899 Other long term (current) drug therapy: Secondary | ICD-10-CM | POA: Insufficient documentation

## 2021-04-28 DIAGNOSIS — F319 Bipolar disorder, unspecified: Secondary | ICD-10-CM | POA: Insufficient documentation

## 2021-04-28 DIAGNOSIS — I4510 Unspecified right bundle-branch block: Secondary | ICD-10-CM

## 2021-04-28 DIAGNOSIS — E872 Acidosis, unspecified: Secondary | ICD-10-CM

## 2021-04-28 DIAGNOSIS — J1282 Pneumonia due to coronavirus disease 2019: Secondary | ICD-10-CM | POA: Insufficient documentation

## 2021-04-28 DIAGNOSIS — N179 Acute kidney failure, unspecified: Secondary | ICD-10-CM | POA: Insufficient documentation

## 2021-04-28 DIAGNOSIS — Z87820 Personal history of traumatic brain injury: Secondary | ICD-10-CM | POA: Insufficient documentation

## 2021-04-28 DIAGNOSIS — R809 Proteinuria, unspecified: Secondary | ICD-10-CM | POA: Insufficient documentation

## 2021-04-28 DIAGNOSIS — R197 Diarrhea, unspecified: Secondary | ICD-10-CM | POA: Insufficient documentation

## 2021-04-28 DIAGNOSIS — E871 Hypo-osmolality and hyponatremia: Secondary | ICD-10-CM | POA: Insufficient documentation

## 2021-04-28 LAB — GFR: EGFR: 21.5

## 2021-04-28 LAB — COMPREHENSIVE METABOLIC PANEL
ALT: 92 U/L — ABNORMAL HIGH (ref 0–55)
AST (SGOT): 212 U/L — ABNORMAL HIGH (ref 5–34)
Albumin/Globulin Ratio: 1.2 (ref 0.9–2.2)
Albumin: 5 g/dL (ref 3.5–5.0)
Alkaline Phosphatase: 51 U/L (ref 37–117)
Anion Gap: 18 — ABNORMAL HIGH (ref 5.0–15.0)
BUN: 64 mg/dL — ABNORMAL HIGH (ref 9.0–28.0)
Bilirubin, Total: 0.7 mg/dL (ref 0.2–1.2)
CO2: 17 mEq/L — ABNORMAL LOW (ref 22–29)
Calcium: 9.6 mg/dL (ref 8.5–10.5)
Chloride: 95 mEq/L — ABNORMAL LOW (ref 100–111)
Creatinine: 4 mg/dL — ABNORMAL HIGH (ref 0.7–1.3)
Globulin: 4.3 g/dL — ABNORMAL HIGH (ref 2.0–3.6)
Glucose: 110 mg/dL — ABNORMAL HIGH (ref 70–100)
Potassium: 4.6 mEq/L (ref 3.5–5.1)
Protein, Total: 9.3 g/dL — ABNORMAL HIGH (ref 6.0–8.3)
Sodium: 130 mEq/L — ABNORMAL LOW (ref 136–145)

## 2021-04-28 LAB — CBC AND DIFFERENTIAL
Absolute NRBC: 0 10*3/uL (ref 0.00–0.00)
Basophils Absolute Automated: 0.02 10*3/uL (ref 0.00–0.08)
Basophils Automated: 0.2 %
Eosinophils Absolute Automated: 0 10*3/uL (ref 0.00–0.44)
Eosinophils Automated: 0 %
Hematocrit: 55.1 % — ABNORMAL HIGH (ref 37.6–49.6)
Hgb: 20.2 g/dL — ABNORMAL HIGH (ref 12.5–17.1)
Immature Granulocytes Absolute: 0.03 10*3/uL (ref 0.00–0.07)
Immature Granulocytes: 0.4 %
Lymphocytes Absolute Automated: 1.13 10*3/uL (ref 0.42–3.22)
Lymphocytes Automated: 13.9 %
MCH: 31.4 pg (ref 25.1–33.5)
MCHC: 36.7 g/dL — ABNORMAL HIGH (ref 31.5–35.8)
MCV: 85.6 fL (ref 78.0–96.0)
MPV: 11.9 fL (ref 8.9–12.5)
Monocytes Absolute Automated: 1.19 10*3/uL — ABNORMAL HIGH (ref 0.21–0.85)
Monocytes: 14.6 %
Neutrophils Absolute: 5.77 10*3/uL (ref 1.10–6.33)
Neutrophils: 70.9 %
Nucleated RBC: 0 /100 WBC (ref 0.0–0.0)
Platelets: 189 10*3/uL (ref 142–346)
RBC: 6.44 10*6/uL — ABNORMAL HIGH (ref 4.20–5.90)
RDW: 14 % (ref 11–15)
WBC: 8.14 10*3/uL (ref 3.10–9.50)

## 2021-04-28 LAB — MAGNESIUM: Magnesium: 3.5 mg/dL — ABNORMAL HIGH (ref 1.6–2.6)

## 2021-04-28 MED ORDER — GUAIFENESIN-CODEINE 100-10 MG/5ML PO SYRP
10.0000 mL | ORAL_SOLUTION | Freq: Once | ORAL | Status: AC
Start: 2021-04-28 — End: 2021-04-28
  Administered 2021-04-28: 22:00:00 10 mL via ORAL
  Filled 2021-04-28: qty 10

## 2021-04-28 MED ORDER — LACTATED RINGERS IV BOLUS
1000.0000 mL | Freq: Once | INTRAVENOUS | Status: AC
Start: 2021-04-28 — End: 2021-04-29
  Administered 2021-04-28: 1000 mL via INTRAVENOUS

## 2021-04-28 MED ORDER — LACTATED RINGERS IV BOLUS
1000.0000 mL | Freq: Once | INTRAVENOUS | Status: AC
Start: 2021-04-28 — End: 2021-04-28
  Administered 2021-04-28: 22:00:00 1000 mL via INTRAVENOUS

## 2021-04-28 MED ORDER — KETOROLAC TROMETHAMINE 30 MG/ML IJ SOLN
15.0000 mg | Freq: Once | INTRAMUSCULAR | Status: AC
Start: 2021-04-28 — End: 2021-04-28
  Administered 2021-04-28: 22:00:00 15 mg via INTRAVENOUS
  Filled 2021-04-28: qty 1
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
## 2021-04-28 NOTE — ED Notes (Signed)
Bed: S 22  Expected date:   Expected time:   Means of arrival:   Comments:  RN has A

## 2021-04-28 NOTE — ED Notes (Signed)
[facility] EMERGENCY DEPT  ED NURSING NOTE FOR THE RECEIVING INPATIENT NURSE   ED [employer] RN   [place] [address]   ED CHARGE RN Toma Copier RN   ADMISSION INFORMATION   Sanjith Siwek Ashutosh Dieguez. is a 30 y.o. male admitted with an ED diagnosis of:    1. COVID-19    2. AKI (acute kidney injury)    3. Metabolic acidosis         Isolation: None   Allergies: Pollen extract   Holding Orders confirmed? N/A   Belongings Documented? N/A   Home medications sent to pharmacy confirmed? N/A   NURSING CARE   Patient Comes From:   Mental Status: Home/Family Care  alert and oriented   ADL: Needs assistance with ADLs   Ambulation: Unable to assess   Pertinent Information  and Safety Concerns: Pt is alert and oriented, calm and cooperative. Pt was dx w covid on 8/29. PResenting flu like symptoms.      CT / NIH   CT Head ordered on this patient?  N/A   NIH/Dysphagia assessment done prior to admission? N/A   VITAL SIGNS (at the time of this note)      Vitals:    04/28/21 2330   BP: 129/90   Pulse: 88   Resp: 16   Temp:    SpO2: 99%

## 2021-04-29 DIAGNOSIS — U071 COVID-19: Secondary | ICD-10-CM

## 2021-04-29 DIAGNOSIS — N189 Chronic kidney disease, unspecified: Secondary | ICD-10-CM

## 2021-04-29 DIAGNOSIS — F1290 Cannabis use, unspecified, uncomplicated: Secondary | ICD-10-CM

## 2021-04-29 DIAGNOSIS — N179 Acute kidney failure, unspecified: Secondary | ICD-10-CM

## 2021-04-29 DIAGNOSIS — R112 Nausea with vomiting, unspecified: Secondary | ICD-10-CM

## 2021-04-29 LAB — CBC
Absolute NRBC: 0 10*3/uL (ref 0.00–0.00)
Hematocrit: 47.2 % (ref 37.6–49.6)
Hgb: 16.9 g/dL (ref 12.5–17.1)
MCH: 30.8 pg (ref 25.1–33.5)
MCHC: 35.8 g/dL (ref 31.5–35.8)
MCV: 86 fL (ref 78.0–96.0)
MPV: 12.9 fL — ABNORMAL HIGH (ref 8.9–12.5)
Nucleated RBC: 0 /100 WBC (ref 0.0–0.0)
Platelets: 175 10*3/uL (ref 142–346)
RBC: 5.49 10*6/uL (ref 4.20–5.90)
RDW: 14 % (ref 11–15)
WBC: 7.61 10*3/uL (ref 3.10–9.50)

## 2021-04-29 LAB — ECG 12-LEAD
Atrial Rate: 96 {beats}/min
IHS MUSE NARRATIVE AND IMPRESSION: NORMAL
P Axis: 87 degrees
P-R Interval: 124 ms
Q-T Interval: 402 ms
QRS Duration: 124 ms
QTC Calculation (Bezet): 507 ms
R Axis: 96 degrees
T Axis: 78 degrees
Ventricular Rate: 96 {beats}/min

## 2021-04-29 LAB — URINALYSIS, REFLEX TO MICROSCOPIC EXAM IF INDICATED
Bilirubin, UA: NEGATIVE
Glucose, UA: NEGATIVE
Leukocyte Esterase, UA: NEGATIVE
Nitrite, UA: NEGATIVE
Specific Gravity UA: 1.026 (ref 1.001–1.035)
Urine pH: 6 (ref 5.0–8.0)
Urobilinogen, UA: NORMAL mg/dL (ref 0.2–2.0)

## 2021-04-29 LAB — BASIC METABOLIC PANEL
Anion Gap: 10 (ref 5.0–15.0)
BUN: 62 mg/dL — ABNORMAL HIGH (ref 9.0–28.0)
CO2: 20 mEq/L — ABNORMAL LOW (ref 22–29)
Calcium: 8.7 mg/dL (ref 8.5–10.5)
Chloride: 98 mEq/L — ABNORMAL LOW (ref 100–111)
Creatinine: 2.7 mg/dL — ABNORMAL HIGH (ref 0.7–1.3)
Glucose: 110 mg/dL — ABNORMAL HIGH (ref 70–100)
Potassium: 4.1 mEq/L (ref 3.5–5.1)
Sodium: 128 mEq/L — ABNORMAL LOW (ref 136–145)

## 2021-04-29 LAB — LACTIC ACID, PLASMA: Lactic Acid: 0.8 mmol/L (ref 0.2–2.0)

## 2021-04-29 LAB — GFR: EGFR: 33.8

## 2021-04-29 LAB — COVID-19 (SARS-COV-2) & INFLUENZA  A/B, NAA (ROCHE LIAT)
Influenza A: NOT DETECTED
Influenza B: NOT DETECTED
SARS CoV 2 Overall Result: DETECTED — AB

## 2021-04-29 LAB — MAGNESIUM: Magnesium: 3 mg/dL — ABNORMAL HIGH (ref 1.6–2.6)

## 2021-04-29 MED ORDER — PLASMA-LYTE A IV INFUSION
INTRAVENOUS | Status: DC
Start: 2021-04-29 — End: 2021-04-29

## 2021-04-29 MED ORDER — SERTRALINE HCL 50 MG PO TABS
25.0000 mg | ORAL_TABLET | Freq: Every day | ORAL | Status: DC
Start: 2021-04-29 — End: 2021-04-30
  Administered 2021-04-29 – 2021-04-30 (×2): 25 mg via ORAL
  Filled 2021-04-29 (×2): qty 1

## 2021-04-29 MED ORDER — DEXTROSE 10 % IV BOLUS
25.0000 g | INTRAVENOUS | Status: DC | PRN
Start: 2021-04-29 — End: 2021-04-30

## 2021-04-29 MED ORDER — SODIUM CHLORIDE 0.9 % IV SOLN
12.5000 mg | Freq: Four times a day (QID) | INTRAVENOUS | Status: DC | PRN
Start: 2021-04-29 — End: 2021-04-30
  Filled 2021-04-29: qty 1

## 2021-04-29 MED ORDER — OLANZAPINE 5 MG PO TBDP
5.0000 mg | ORAL_TABLET | Freq: Two times a day (BID) | ORAL | Status: DC | PRN
Start: 2021-04-29 — End: 2021-04-30
  Filled 2021-04-29 (×2): qty 1

## 2021-04-29 MED ORDER — NALOXONE HCL 0.4 MG/ML IJ SOLN (WRAP)
0.2000 mg | INTRAMUSCULAR | Status: DC | PRN
Start: 2021-04-29 — End: 2021-04-30

## 2021-04-29 MED ORDER — ONDANSETRON HCL 8 MG PO TABS
4.0000 mg | ORAL_TABLET | Freq: Three times a day (TID) | ORAL | Status: DC | PRN
Start: 2021-04-29 — End: 2021-04-30

## 2021-04-29 MED ORDER — ACETAMINOPHEN 325 MG PO TABS
650.0000 mg | ORAL_TABLET | Freq: Four times a day (QID) | ORAL | Status: DC | PRN
Start: 2021-04-29 — End: 2021-04-30
  Administered 2021-04-29: 650 mg via ORAL
  Filled 2021-04-29: qty 2

## 2021-04-29 MED ORDER — MELATONIN 3 MG PO TABS
3.0000 mg | ORAL_TABLET | Freq: Every evening | ORAL | Status: DC | PRN
Start: 2021-04-29 — End: 2021-04-30

## 2021-04-29 MED ORDER — DEXTROSE 5% IV BOLUS
250.0000 mL | INTRAVENOUS | Status: DC | PRN
Start: 2021-04-29 — End: 2021-04-30

## 2021-04-29 MED ORDER — LORAZEPAM 0.5 MG PO TABS
1.0000 mg | ORAL_TABLET | ORAL | Status: DC | PRN
Start: 2021-04-29 — End: 2021-04-29

## 2021-04-29 MED ORDER — DEXAMETHASONE SODIUM PHOSPHATE 4 MG/ML IJ SOLN (WRAP)
6.0000 mg | Freq: Every day | INTRAMUSCULAR | Status: DC
Start: 2021-04-29 — End: 2021-04-29
  Filled 2021-04-29: qty 2

## 2021-04-29 MED ORDER — OLANZAPINE 10 MG IM SOLR
5.0000 mg | Freq: Two times a day (BID) | INTRAMUSCULAR | Status: DC | PRN
Start: 2021-04-29 — End: 2021-04-30
  Filled 2021-04-29 (×2): qty 5

## 2021-04-29 MED ORDER — SODIUM CHLORIDE 0.9 % IV SOLN
INTRAVENOUS | Status: DC
Start: 2021-04-29 — End: 2021-04-30

## 2021-04-29 MED ORDER — LORAZEPAM 2 MG/ML IJ SOLN
1.0000 mg | INTRAMUSCULAR | Status: DC | PRN
Start: 2021-04-29 — End: 2021-04-29

## 2021-04-29 MED ORDER — DEXTROSE 50 % IV SOLN
25.0000 g | INTRAVENOUS | Status: DC | PRN
Start: 2021-04-29 — End: 2021-04-30

## 2021-04-29 MED ORDER — ONDANSETRON HCL 4 MG/2ML IJ SOLN
4.0000 mg | Freq: Three times a day (TID) | INTRAMUSCULAR | Status: DC | PRN
Start: 2021-04-29 — End: 2021-04-30
  Administered 2021-04-29 (×2): 4 mg via INTRAVENOUS
  Filled 2021-04-29 (×2): qty 2

## 2021-04-29 MED ORDER — GLUCAGON 1 MG IJ SOLR (WRAP)
1.0000 mg | INTRAMUSCULAR | Status: DC | PRN
Start: 2021-04-29 — End: 2021-04-30

## 2021-04-29 MED ORDER — ENOXAPARIN SODIUM 30 MG/0.3ML IJ SOSY
30.0000 mg | PREFILLED_SYRINGE | Freq: Every day | INTRAMUSCULAR | Status: DC
Start: 2021-04-29 — End: 2021-04-30
  Filled 2021-04-29: qty 0.3

## 2021-04-29 NOTE — Plan of Care (Signed)
Problem: Safety  Goal: Patient will be free from injury during hospitalization  Outcome: Progressing  Flowsheets (Taken 04/29/2021 0525)  Patient will be free from injury during hospitalization:   Assess patient's risk for falls and implement fall prevention plan of care per policy   Provide and maintain safe environment   Use appropriate transfer methods   Hourly rounding  Goal: Patient will be free from infection during hospitalization  Outcome: Progressing  Flowsheets (Taken 04/29/2021 0525)  Free from Infection during hospitalization:   Assess and monitor for signs and symptoms of infection   Monitor lab/diagnostic results   Encourage patient and family to use good hand hygiene technique     Problem: Pain  Goal: Pain at adequate level as identified by patient  Outcome: Progressing  Flowsheets (Taken 04/29/2021 0525)  Pain at adequate level as identified by patient:   Identify patient comfort function goal   Assess pain on admission, during daily assessment and/or before any "as needed" intervention(s)   Reassess pain within 30-60 minutes of any procedure/intervention, per Pain Assessment, Intervention, Reassessment (AIR) Cycle   Include patient/patient care companion in decisions related to pain management as needed     Problem: Side Effects from Pain Analgesia  Goal: Patient will experience minimal side effects of analgesic therapy  Outcome: Progressing  Flowsheets (Taken 04/29/2021 0525)  Patient will experience minimal side effects of analgesic therapy:   Monitor/assess patient's respiratory status (RR depth, effort, breath sounds)   Assess for changes in cognitive function     Problem: Discharge Barriers  Goal: Patient will be discharged home or other facility with appropriate resources  Outcome: Progressing  Flowsheets (Taken 04/29/2021 0525)  Discharge to home or other facility with appropriate resources:   Provide appropriate patient education   Initiate discharge planning     Problem: Psychosocial and  Spiritual Needs  Goal: Demonstrates ability to cope with hospitalization/illness  Outcome: Progressing  Flowsheets (Taken 04/29/2021 0525)  Demonstrates ability to cope with hospitalizations/illness:   Encourage verbalization of feelings/concerns/expectations   Provide quiet environment   Include patient/ patient care companion in decisions

## 2021-04-29 NOTE — Progress Notes (Signed)
Adult Observation Progress Note        Shift Note:  Pt received from ED. Head to toe assessment performed. Pt denies nausea/vomiting and pain. Reports generalized body aches.   General: VSS, low fall risk, mother at bedside  Neuro: AAOx4  Cardio: NS on tele  Resp: lung sounds clear on RA  Integ: peeling on BL hands  MSK: active all extremities  GI: pt reports episode of diarrhea 04/29/21  GU: continent  IV Access: 20G L posterior FA     Events during shift:   - pt placed on plasmalyte, educated on the importance of maintaining ordered rate and asking staff to disconnect him from IV fluids.    BM during shift: no     Pending Orders:  - IVF  - decadron  - CIWA    Discharge Plan: tbd    Social/Family Visits: pt's mother at bedside, updated on POC 04/29/21    POC update:  pt updated on POC 04/29/21
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
## 2021-04-29 NOTE — Plan of Care (Signed)
General: Patient VSS , in no apparent distress at this time.  Neuro: Patient is AOx4. Patient denies numbness or tingling. Perrla.   Cardio: per day shift RN, NSR on telemetry. S1, S2 auscultated.   Patient non compliant with telemetry at this time.  Resp: Patient on RA with lung sounds clear bilaterally on auscultation.   Integ: Skin intact, no wounds or edema noted.   MSK: Patient ambulates: independently with: no assistance. low/mod/high: Low falls risk.   GI: Bowel sounds auscultated all 4 quadrants.   GU: Patient is continent of urine.   IV Access: IV: 20g  in: L forearm. NS @125     No significant events or provider communication. Patient denies pain at this time, and is resting comfortably in bed.    BM during shift: YES/NO: no LBM 04/28/21    Pending Orders:   Telemetry  IVF  US kidneys  Nephro following    Discharge Plan: To TBD, pending medical clearance    Social/Family Visits: mother at bedside, will stay the night    POC update: pt updated with POC during bedside report      Vitals:    04/29/21 0400 04/29/21 0741 04/29/21 1123 04/29/21 2003   BP: (!) 141/95 118/81 133/86 158/89   Pulse: 89 81 77 76   Resp: 18 18 18 18    Temp: 97.6 F (36.4 C) 98.2 F (36.8 C) 98.2 F (36.8 C) 98 F (36.7 C)   TempSrc: Oral Oral Oral    SpO2: 98% 98% 99% 97%   Weight:       Height:           Patient Lines/Drains/Airways Status       Active Lines, Drains and Airways       Name Placement date Placement time Site Days    Peripheral IV 04/28/21 20 G Standard Distal;Left;Posterior Forearm 04/28/21  2200  Forearm  less than 1                    Problem: Safety  Goal: Patient will be free from injury during hospitalization  Outcome: Progressing  Flowsheets (Taken 04/29/2021 0525 by Lupita Raider, RN)  Patient will be free from injury during hospitalization:   Assess patient's risk for falls and implement fall prevention plan of care per policy   Provide and maintain safe environment   Use appropriate transfer methods    Hourly rounding  Goal: Patient will be free from infection during hospitalization  Outcome: Progressing  Flowsheets (Taken 04/29/2021 0525 by Lupita Raider, RN)  Free from Infection during hospitalization:   Assess and monitor for signs and symptoms of infection   Monitor lab/diagnostic results   Encourage patient and family to use good hand hygiene technique     Problem: Pain  Goal: Pain at adequate level as identified by patient  Outcome: Progressing  Flowsheets (Taken 04/29/2021 0525 by Lupita Raider, RN)  Pain at adequate level as identified by patient:   Identify patient comfort function goal   Assess pain on admission, during daily assessment and/or before any "as needed" intervention(s)   Reassess pain within 30-60 minutes of any procedure/intervention, per Pain Assessment, Intervention, Reassessment (AIR) Cycle   Include patient/patient care companion in decisions related to pain management as needed     Problem: Side Effects from Pain Analgesia  Goal: Patient will experience minimal side effects of analgesic therapy  Outcome: Progressing  Flowsheets (Taken 04/29/2021 0525 by Lupita Raider, RN)  Patient will experience minimal side  effects of analgesic therapy:   Monitor/assess patient's respiratory status (RR depth, effort, breath sounds)   Assess for changes in cognitive function     Problem: Discharge Barriers  Goal: Patient will be discharged home or other facility with appropriate resources  Outcome: Progressing  Flowsheets (Taken 04/29/2021 0525 by Lupita Raider, RN)  Discharge to home or other facility with appropriate resources:   Provide appropriate patient education   Initiate discharge planning     Problem: Psychosocial and Spiritual Needs  Goal: Demonstrates ability to cope with hospitalization/illness  Outcome: Progressing  Flowsheets (Taken 04/29/2021 0525 by Lupita Raider, RN)  Demonstrates ability to cope with hospitalizations/illness:   Encourage verbalization of  feelings/concerns/expectations   Provide quiet environment   Include patient/ patient care companion in decisions     Problem: Moderate/High Fall Risk Score >5  Goal: Patient will remain free of falls  Outcome: Progressing  Flowsheets (Taken 04/29/2021 2054)  Moderate Risk (6-13): LOW-Fall Interventions Appropriate for Low Fall Risk

## 2021-04-29 NOTE — OT Progress Note (Signed)
Occupational Therapy Note    [facility]   Occupational Therapy Cancellation Note      Patient:  Joel [name]. MRN#:  [id number]  Unit:  [facility] TOWER 6 Room/Bed:  F637/F637.01    04/29/2021  Time: 11:06AM      Patient not seen for occupational therapy secondary to per RN and PT pt independent with no acute OT needs. Please reconsult if there is any significant changes in ADL independence or functional mobility. D/C acute OT services.       Delanna Notice, MS, OTR/L  Pager [phone]
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
## 2021-04-29 NOTE — Plan of Care (Signed)
Problem: Safety  Goal: Patient will be free from injury during hospitalization  Outcome: Progressing  Flowsheets (Taken 04/29/2021 0525 by Lupita Raider, RN)  Patient will be free from injury during hospitalization:   Assess patient's risk for falls and implement fall prevention plan of care per policy   Provide and maintain safe environment   Use appropriate transfer methods   Hourly rounding  Goal: Patient will be free from infection during hospitalization  Outcome: Progressing  Flowsheets (Taken 04/29/2021 0525 by Lupita Raider, RN)  Free from Infection during hospitalization:   Assess and monitor for signs and symptoms of infection   Monitor lab/diagnostic results   Encourage patient and family to use good hand hygiene technique     Problem: Pain  Goal: Pain at adequate level as identified by patient  Outcome: Progressing  Flowsheets (Taken 04/29/2021 0525 by Lupita Raider, RN)  Pain at adequate level as identified by patient:   Identify patient comfort function goal   Assess pain on admission, during daily assessment and/or before any "as needed" intervention(s)   Reassess pain within 30-60 minutes of any procedure/intervention, per Pain Assessment, Intervention, Reassessment (AIR) Cycle   Include patient/patient care companion in decisions related to pain management as needed     Problem: Side Effects from Pain Analgesia  Goal: Patient will experience minimal side effects of analgesic therapy  Outcome: Progressing  Flowsheets (Taken 04/29/2021 0525 by Lupita Raider, RN)  Patient will experience minimal side effects of analgesic therapy:   Monitor/assess patient's respiratory status (RR depth, effort, breath sounds)   Assess for changes in cognitive function     Problem: Discharge Barriers  Goal: Patient will be discharged home or other facility with appropriate resources  Outcome: Progressing  Flowsheets (Taken 04/29/2021 0525 by Lupita Raider, RN)  Discharge to home or other facility with appropriate  resources:   Provide appropriate patient education   Initiate discharge planning     Problem: Psychosocial and Spiritual Needs  Goal: Demonstrates ability to cope with hospitalization/illness  Outcome: Progressing  Flowsheets (Taken 04/29/2021 0525 by Lupita Raider, RN)  Demonstrates ability to cope with hospitalizations/illness:   Encourage verbalization of feelings/concerns/expectations   Provide quiet environment   Include patient/ patient care companion in decisions

## 2021-04-29 NOTE — Progress Notes (Signed)
Adult Observation Progress Note        Shift Note:    General: Introduced self to patient and updated whiteboard with POC. 1:1 sitter at bedside for safety.   Neuro: AAOx4, English speaking; reported mild abdominal/chest pain, prn tylenol given w/hot packs and resolved; reported dizziness this am, but resolved; denies numbness/tingling  Cardio: NSR on tele, often refuses tele d/t frequent need to shower  Resp: Clear on RA, denies SOB  Integ: Intact  MSK: Independent  GI: Continent  GU: Continent, voiding spontaneously  IV Access: 20 g LFA, infusing NS@100      Events during shift: PT/OT, nephrology consult, trio rounds    1600 Patient became increasingly agitated and impatient at staff, particularly regarding isolation precautions and inability to leave room. Upon investigation, patient had initially called front desk via call bell to ask nurse to be reconnected to IVF and made several attempts to exit his room/get attention of staff at nurse's station across room. This RN was on her way to patient's room and overheard patient cursing and arguing with assigned sitter. This RN told patient to "calm down" and inquired about patient's needs and instructed patient that he had to stay in his room as he is COVID+ and that she would bring him whatever he needed and be in there shortly. Patient agreed to stay in the room as this RN gathered items and returned to reconnect his IV back to IVF. Patient further educated on importance of decreasing exposure to staff and other patients should he exit room as he stated that he would like to "walk down the hall." Patient vocalized understanding of the former and stated he would adhere to the isolation precautions in place. Charge RN June notified. Assigned sitter given panic button just in case patient get aggressive d/t hx of TBI.    BM during shift: No     Pending Orders: 1:1 sitter, tele, IVF, US kidneys    Discharge Plan: TBD    Social/Family Visits: Mom    POC update: Patient  and mom updated on POC
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
## 2021-04-29 NOTE — PT Progress Note (Signed)
[facility]   Physical Therapy Cancellation Note      Patient:  Joel [name]. MRN#:  [id number]  Unit:  [facility] TOWER 6 Room/Bed:  F637/F637.01    04/29/2021  Time: 1042      Pt not seen for physical therapy secondary to pt without therapy needs per RN. Orders completed, available for re-consult if status changes.    Freada Bergeron, DPT
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
## 2021-04-29 NOTE — Progress Notes (Signed)
NURSING ADMISSION NOTE  /   Date Time: 04/29/21  Patient Name: Joel [name]  Attending Physician: Novella Olive     Date of Admission:   04/29/21     Reason for Admission:   COVID     Admitted from:   ED     Nursing Note:   Patient Aox4. English speaking, and VSS. On tele, NS, confirmed with [facility], and alerted Charge Nurse [place]. Oriented patient to the floor and POC. Bed to low position. Call bell within reach.      Patient scores as low/mod/high: low. Patient ambulates: independently.  Bed alarm on and floor mats in place. Patient belongings reviewed. Patient skin intact, peeling on hands, 4 eyes in 4 hours with RN Vernona Rieger. Patient reports pain as 0/10.  Safety interventions in place. Will continue to monitor.

## 2021-04-30 LAB — CK
Creatine Kinase (CK): 4400 U/L — ABNORMAL HIGH (ref 47–267)
Creatine Kinase (CK): 4916 U/L — ABNORMAL HIGH (ref 47–267)
Creatine Kinase (CK): 3649 U/L — ABNORMAL HIGH (ref 47–267)

## 2021-04-30 LAB — BASIC METABOLIC PANEL
Anion Gap: 7 (ref 5.0–15.0)
BUN: 30 mg/dL — ABNORMAL HIGH (ref 9.0–28.0)
CO2: 26 mEq/L (ref 22–29)
Calcium: 8.3 mg/dL — ABNORMAL LOW (ref 8.5–10.5)
Chloride: 101 mEq/L (ref 100–111)
Creatinine: 1.2 mg/dL (ref 0.7–1.3)
Glucose: 89 mg/dL (ref 70–100)
Potassium: 3.8 mEq/L (ref 3.5–5.1)
Sodium: 134 mEq/L — ABNORMAL LOW (ref 136–145)

## 2021-04-30 LAB — HEPATIC FUNCTION PANEL
ALT: 61 U/L — ABNORMAL HIGH (ref 0–55)
AST (SGOT): 124 U/L — ABNORMAL HIGH (ref 5–34)
Albumin/Globulin Ratio: 1.3 (ref 0.9–2.2)
Albumin: 3.6 g/dL (ref 3.5–5.0)
Alkaline Phosphatase: 33 U/L — ABNORMAL LOW (ref 37–117)
Bilirubin Direct: 0.4 mg/dL (ref 0.0–0.5)
Bilirubin Indirect: 0.8 mg/dL (ref 0.2–1.0)
Bilirubin, Total: 1.2 mg/dL (ref 0.2–1.2)
Globulin: 2.7 g/dL (ref 2.0–3.6)
Protein, Total: 6.3 g/dL (ref 6.0–8.3)

## 2021-04-30 LAB — CBC
Absolute NRBC: 0 10*3/uL (ref 0.00–0.00)
Hematocrit: 43 % (ref 37.6–49.6)
Hgb: 15.3 g/dL (ref 12.5–17.1)
MCH: 31.7 pg (ref 25.1–33.5)
MCHC: 35.6 g/dL (ref 31.5–35.8)
MCV: 89 fL (ref 78.0–96.0)
MPV: 12.2 fL (ref 8.9–12.5)
Nucleated RBC: 0 /100 WBC (ref 0.0–0.0)
Platelets: 145 10*3/uL (ref 142–346)
RBC: 4.83 10*6/uL (ref 4.20–5.90)
RDW: 14 % (ref 11–15)
WBC: 6.32 10*3/uL (ref 3.10–9.50)

## 2021-04-30 LAB — MAGNESIUM: Magnesium: 2.9 mg/dL — ABNORMAL HIGH (ref 1.6–2.6)

## 2021-04-30 LAB — GFR: EGFR: >60

## 2021-04-30 NOTE — Progress Notes (Signed)
Patient to discharge home with no needs.  Mother to provide transportation.       04/30/21 1514   Discharge Disposition   Patient preference/choice provided? Yes   Physical Discharge Disposition Home   Name of Home Health Agency Placement   (none needed)   Mode of Transportation Car   Patient/Family/POA notified of transfer plan Yes   Patient agreeable to discharge plan/expected d/c date? Yes   Outpatient Services   Home Health   (none)   CM Interventions   Follow up appointment scheduled? No   Reason no follow up scheduled? Family to schedule   Referral made for home health RN visit? No, Other (comment)   Multidisciplinary rounds/family meeting before d/c? Yes   Medicare Checklist   Is this a Medicare patient? No       Joel [name] PRN SW  [facility] Case Management Department  [phone]

## 2021-04-30 NOTE — Discharge Instr - AVS First Page (Addendum)
Reason for your Hospital Admission:  Intractable nausea and vomiting  COVID-19  Elevated Kidney Function -We monitored your kidney function levels (lab testing) which are improving after hydration with IV fluids. We recommend repeat lab testing and follow up with specialist (nephrologist).  Abnormal liver enzymes--Possibly elevated from virus, your levels are improving on repeat labs.   Rhabdomyolysis (breakdown of muscle tissue), see more information below.    Instructions for after your discharge:  -- Schedule a follow up visit with your primary care provider - within 1 week of discharge from the hospital. We have listed [facility] clinic information below if you do not have a primary care provider. They will see you regardless of insurance (This may be a video or telehealth appointment due to COVID isolation precautions).  -- Follow up with nephrologist in 1-2 weeks for reevaluation. We recommend calling your insurance provider to locate a nephrologist in [place]. We have listed information for nephrology group that evaluated you in the hospital if needed.   -- Please have labs rechecked in 1 week with your primary care team or nephrologist to monitor kidney function.   -- Please have kidney ultrasound done, discuss with nephrologist at follow up visit.      -- Resume your home medications, no changes were made.  -- Please avoid all NSAIDs including Ibuprofen, Motrin, Aleve, Advil, Naproxen, + more, due to abnormal kidney function.  -- For mild to moderate pain, you may use over the counter Tylenol and/or lidocaine patches as directed.      -- Hydrate well, activity as tolerated.  -- See below for COVID guidance on isolation and precautions.    -- Return to the nearest emergency department for chest pain, shortness of breath, fever >100.5 degrees, dizziness, passing out, profuse vomiting or diarrhea, or any other concerning symptoms.      COVID-19 Discharge Instructions     What is COVID-19?  COVID-19 is the  disease caused by a coronavirus called SARS-CoV-2. The virus is very contagious, and can be spread through coughing, sneezing, or even talking.  The virus can also be spread by close personal contact. It is possible to become infected through contact with contaminated surfaces or objects, but the risk is generally considered to be low. The COVID-19 virus can still be detected on certain types of material several days after exposure but is likely infectious for a much shorter time. Because of how the virus enters the body, it is important to limit touching your face, mouth, and nose.  It is also important to wash your hands or use hand sanitizer frequently, and to clean high touch surfaces thoroughly.  Social distancing and isolation are important ways to decrease the spread of the virus.      Home Care -- Isolation  Stay at home except to get medical care. You should restrict all activities outside your home. Do not go to work, school, or public areas. Avoid using public transportation, ridesharing, or taxis.  Isolate yourself from other people within your home as much as possible.  Anyone sick should separate themselves from others at home by staying in a specific sick bedroom or space and using a different bathroom (if possible).  Elderly family members, persons with chronic illnesses such as diabetes or cancer, or those taking medications that affect their immune system are at particularly high risk.  For anyone in your home who has increased risk, please notify their primary care physician that you may be infected.  You should restrict  contact with pets and other animals while you are sick with COVID-19.  The virus that causes COVID-19 can spread from people to animals during close contact.  The risk of pets spreading COVID-19 to people is low.  Wear a mask or cloth face covering when you must go out in public. You can refer to MyFabulous.co.nz.html  for instructions on how to select an appropriate mask.  Call ahead before seeing your doctor, any other medical provider, or seeking medical care at any healthcare facility.  You should tell them that you have been diagnosed with or are being tested for COVID-19. IF YOU CALL 911, TELL THE DISPATCHER THAT YOU HAVE BEEN DIAGNOSED WITH OR ARE BEING TESTED FOR COVID-19.    Home Care -- Cleaning  Avoid touching your eyes, nose, mouth, or other parts of your face.  Wash your hands often, especially after blowing your nose, coughing, or sneezing; after going to the bathroom; and before eating or preparing food.  Use soap and water for at least 20 seconds.  Using soap and water is the best option for hand washing if the hands are visibly dirty.  If you cant use soap and water, use an alcohol-based hand sanitizer with at least 60% alcohol, covering all surfaces of your hands and rubbing them together until they feel dry.  Avoid sharing personal household items. You should not share dishes, drinking glasses, cups, eating utensils, towels, or bedding with other people or pets in your home. After using these items, they should be washed thoroughly with soap and water.  Clean all "high touch" surfaces regularly with a disinfecting spray or wipes according to the directions on the label.  High touch surfaces include counters, tabletops, doorknobs, bathroom fixtures, toilets, phones, keyboards, tablets, and bedside tables. Please pay special attention to cleaning your cell phone throughout the day!  Clean any surfaces that may have blood, stool, or other body fluids on them.    Home Care - Symptom Management  Please rest and avoid strenuous activity.  Fevers can be treated with acetaminophen (Tylenol). You should not exceed the total recommended dose on the medication label.   NSAIDs such as ibuprofen (Motrin, Advil) and naproxen (Naprosyn, Aleve) can be used for pain and fevers unless you have a medical condition for which  NSAIDs should be avoided (such as heart disease, kidney disease, high blood pressure, or gastrointestinal bleeding).  You should check with a medical provider if unsure whether you can safely take NSAIDs.  You should avoid NSAIDs if possible if you are taking a steroid such as Dexamethasone as part of your COVID-19 treatment.  Cough medication may have been recommended by your healthcare team.  You should contact a medical provider before using before starting any other over-the-counter cough or cold medications.    Discharging with Home Oxygen  Patients recovering from COVID-19 infection may have low blood oxygen levels and require supplemental oxygen for days to weeks, and sometimes longer, even after being discharged from the hospital.  If needed, home oxygen equipment and supplies will be arranged for you to use after discharge.  As you recover from your infection, the supplemental oxygen that you need will hopefully decrease and eventually be stopped completely.  You should have close medical follow-up after discharge to assess your oxygen requirements and overall progress recovering from your infection.  If you are being discharged with supplemental oxygen, it is very important that you have a pulse oximeter available to monitor the oxygen levels in your  body.  If you do not already have a pulse oximeter, one will be provided at the time of discharge.  The pulse oximeter is a clip-like device which is typically placed on your finger.  It should apply gentle pressure, but not be constricting or too loose.  Nail polish and other colored materials on the finger can interfere with the oxygen measurements.  Poor circulation in the hands or constricted blood vessels from the cold can also cause inaccurate measurements.   You should check your oxygen level (SpO2) periodically while at rest and with your daily activities.  Unless instructed otherwise by your physician, you should use the supplemental oxygen to  maintain SpO2 >92% as measured on your pulse oximeter.  You should contact your follow-up provider if you have increasing oxygen requirements at home.  You should return to the hospital or seek other immediate medical attention if unable to maintain SpO2 > 92% (or other specified level) using your home oxygen equipment.    What to Look Out For?  Most people with COVID-19 infection will get better, and many will have only mild symptoms. However, a significant number will develop severe infection and symptoms which require close monitoring and treatment in the hospital.  Even after completing treatment in the hospital, patients may have persistent symptoms and require oxygen at home while recovering.  Please contact your doctor or return to the Emergency Department if you develop the warning signs of worsening infection or other complications such as:   A high fever not controlled by acetaminophen (Tylenol)  Worsening shortness of breath that limits your usual activities  Worsening chest pain, pressure, or tightness  Blue, white, or gray colored lips  Cold arms, legs, hands, or feet with blue, white, or gray skin  Severe weakness and fatigue  New confusion or sleepiness  Sudden numbness, weakness, or loss of balance  Difficulty speaking, vision problems, or severe headache  Unable to maintain SpO2 >92% on a pulse oximeter  This list is not all inclusive; please consult your medical provider for any other severe symptoms that are concerning to you.    Discontinuing Home Isolation  Patients with confirmed COVID-19 should remain under home isolation precautions until the risk of giving it to others is thought to be very low. Day 0 is your first day of symptoms or a positive viral test. Day 1 is the first full day after your symptoms developed or your test specimen was collected.  If you develop symptoms after testing positive, your 5-day isolation period should start over, and Day 0 should be considered your first day of  symptoms.  If you have a positive COVID-19 test or have symptoms, isolate at home for at least 5 days.  In order to take yourself off home isolation, the CDC currently recommends that you meet all of these criteria:    You have had no fever for at least 24 hours (that is one full day of no fever without Acetaminophen or other fever reducing medications)  AND  other symptoms are improving (such as cough and shortness of breath)  AND  at least 5 full days have passed since your symptoms first appeared or your positive test (if asymptomatic)    You should continue to wear a well-fitting mask around others at home and in public for 5 additional days (day 6 through day 10) after the end of your 5-day isolation period. If you are unable to wear a mask when around others, you should continue  to isolate for a full 10 days.    People who are severely ill with COVID-19 might need to stay home longer than 10 days and up to 20 days after symptoms first appeared. Persons who are severely immunocompromised may require testing to determine when they can be around others. Talk to your healthcare provider for more information.    There may be additional state or local isolation recommendations or restrictions in place that you should continue to follow.    If you are not sure about whether you can discontinue your home isolation, please call your primary care doctor for advice.    For further information on quarantine and isolation recommendations, please visit the Center for Disease Control (CDC) website below.     For further information please visit the Center for Disease Control (CDC) website:  CDC Coronavirus (COVID-19) Webpage  http://bradshaw.com/  What to Do If You Are Sick  DiscoHelp.si.html  When Can You Be Around Others After COVID-19  Infection  HostessTraining.at.html      For COVID-19 Vaccine information, please visit:  MarathonShow.se      For information on COVID-19 Research Opportunities, please visit:   PeaceSeek.ca?category=9001

## 2021-04-30 NOTE — Plan of Care (Addendum)
Adult Observation Progress Note        Shift Note    General: Pt anxious. During shift change, Pt wanted to leave room and go outside for some "fresh air". Pt became slightly agitated, but was able to be redirected. Security was called, but was no longer needed upon arrival.   Neuro: A/Ox4  Cardio:  Tele ordered, but Pt refusing  Resp: Clear, bilateral lung sounds. Pt denies sob/difficulty breathing  Integ: CDI. Has taken several showers since start of shift  MSK:  Steady gait, independent, low fall risk  GI: continent. Denies N/V.   GU: continent  IV Access: CDI     Events during shift: Agitation earlier during AM shift change. Pt was redirectable.     BM during shift: no     Pending Orders: Monitor labs, telemetry (refusing)    Discharge Plan: pending    Social/Family Visits: none at bedside    POC update:  Pt updated on plan of care    Problem: Safety  Goal: Patient will be free from injury during hospitalization  Outcome: Progressing  Flowsheets (Taken 04/29/2021 0525 by Lupita Raider, RN)  Patient will be free from injury during hospitalization:   Assess patient's risk for falls and implement fall prevention plan of care per policy   Provide and maintain safe environment   Use appropriate transfer methods   Hourly rounding  Goal: Patient will be free from infection during hospitalization  Outcome: Progressing  Flowsheets (Taken 04/29/2021 0525 by Lupita Raider, RN)  Free from Infection during hospitalization:   Assess and monitor for signs and symptoms of infection   Monitor lab/diagnostic results   Encourage patient and family to use good hand hygiene technique     Problem: Pain  Goal: Pain at adequate level as identified by patient  Outcome: Progressing  Flowsheets (Taken 04/29/2021 0525 by Lupita Raider, RN)  Pain at adequate level as identified by patient:   Identify patient comfort function goal   Assess pain on admission, during daily assessment and/or before any "as needed"  intervention(s)   Reassess pain within 30-60 minutes of any procedure/intervention, per Pain Assessment, Intervention, Reassessment (AIR) Cycle   Include patient/patient care companion in decisions related to pain management as needed     Problem: Side Effects from Pain Analgesia  Goal: Patient will experience minimal side effects of analgesic therapy  Outcome: Progressing  Flowsheets (Taken 04/29/2021 0525 by Lupita Raider, RN)  Patient will experience minimal side effects of analgesic therapy:   Monitor/assess patient's respiratory status (RR depth, effort, breath sounds)   Assess for changes in cognitive function     Problem: Discharge Barriers  Goal: Patient will be discharged home or other facility with appropriate resources  Outcome: Progressing  Flowsheets (Taken 04/29/2021 0525 by Lupita Raider, RN)  Discharge to home or other facility with appropriate resources:   Provide appropriate patient education   Initiate discharge planning     Problem: Psychosocial and Spiritual Needs  Goal: Demonstrates ability to cope with hospitalization/illness  Outcome: Progressing  Flowsheets (Taken 04/29/2021 0525 by Lupita Raider, RN)  Demonstrates ability to cope with hospitalizations/illness:   Encourage verbalization of feelings/concerns/expectations   Provide quiet environment   Include patient/ patient care companion in decisions     Problem: Moderate/High Fall Risk Score >5  Goal: Patient will remain free of falls  Outcome: Progressing  Flowsheets  Taken 04/29/2021 2054 by Albertina Parr, RN  Moderate Risk (6-13): LOW-Fall Interventions Appropriate for Low Fall Risk  Taken  04/29/2021 0845 by Orson Eva, RN  High (Greater than 13):   HIGH-Visual cue at entrance to patient's room   HIGH-Bed alarm on at all times while patient in bed   HIGH-Utilize chair pad alarm for patient while in the chair   HIGH-Apply yellow "Fall Risk" arm band   HIGH-Pharmacy to initiate evaluation and intervention per protocol   HIGH-Initiate  use of floor mats as appropriate   HIGH-Consider use of low bed     Problem: Compromised Hemodynamic Status  Goal: Vital signs and fluid balance maintained/improved  Outcome: Progressing  Flowsheets (Taken 04/30/2021 0953)  Vital signs and fluid balance are maintained/improved:   Position patient for maximum circulation/cardiac output   Monitor/assess vitals and hemodynamic parameters with position changes     Problem: Inadequate Gas Exchange  Goal: Adequate oxygenation and improved ventilation  Outcome: Progressing  Flowsheets (Taken 04/30/2021 0953)  Adequate oxygenation and improved ventilation:   Assess lung sounds   Monitor SpO2 and treat as needed     Problem: Inadequate Airway Clearance  Goal: Normal respiratory rate/effort achieved/maintained  Outcome: Progressing  Flowsheets (Taken 04/30/2021 0953)  Normal respiratory rate/effort achieved/maintained: Plan activities to conserve energy: plan rest periods

## 2021-04-30 NOTE — Progress Notes (Signed)
[facility] [facility] PROGRESS NOTE  [id number]      Date Time: 04/30/21 10:40 AM  Patient Name: [name]  Attending Physician: Roxana Hires, MD    CC: follow-up AKI    Assessment:     AKI - 2nd to pre-renal + rhabdo, improving with IVF.  Unclear baseline - multiple AKI insults in past.  Proteinuria - persistent but always in the setting of AKI.    Quantify once AKI resolved.  Rhabdo - one case report of rhabdo with [facility] (https://link.springer.com/article/10.1007/s11606-269-653-0388-1), possibly related though to exercise then.  COVID?  Cont IVF.  Hyponatremia - mild/better with IVF, follow  [facility] - cont IVF      Recommendations:     Cont NS until able to take adequate PO  BMP and CK daily      Case discussed with: pt and family      Debbe Mounts, MD  [facility] [facility]  [id number] (office)      Subjective:  feels better, wants to go but has not taken much po yet    Review of Systems:   Review of Systems - no sob    Physical Exam:     Vitals:    04/29/21 1123 04/29/21 2003 04/29/21 2335 04/30/21 0554   BP: 133/86 158/89 146/87 (!) 151/97   Pulse: 77 76 80 86   Resp: 18 18 18 18    Temp: 98.2 F (36.8 C) 98 F (36.7 C) 98.3 F (36.8 C) 98 F (36.7 C)   TempSrc: Oral  Oral Axillary   SpO2: 99% 97% 99% 99%   Weight:       Height:           Intake and Output Summary (Last 24 hours) at Date Time  No intake or output data in the 24 hours ending 04/30/21 1040      General: awake, alert, no acute distress.  Cardiovascular: regular rate and rhythm, no murmurs, rubs or gallops  Lungs: clear to auscultation bilaterally, without wheezing, rhonchi, or rales  Abdomen: soft, non-tender, non-distended, normoactive bowel sounds  Extremities: no edema      Meds:      Scheduled Meds: PRN Meds:    enoxaparin, 30 mg, Subcutaneous, Daily  sertraline, 25 mg, Oral, Daily          Continuous Infusions:   sodium chloride 125 mL/hr at 04/29/21 2332    acetaminophen, 650 mg, Q6H PRN  glucagon (rDNA), 1 mg, PRN    And  dextrose, 250 mL, PRN   And  dextrose, 25 g, PRN   And  dextrose, 25 g, PRN  melatonin, 3 mg, QHS PRN  naloxone, 0.2 mg, PRN  OLANZapine zydis, 5 mg, BID PRN   Or  OLANZapine, 5 mg, BID PRN  ondansetron, 4 mg, Q8H PRN   Or  ondansetron, 4 mg, Q8H PRN  promethazine (PHENERGAN) IVPB, 12.5 mg, Q6H PRN              Labs:     Recent Labs   Lab 04/30/21  0606 04/29/21  0418 04/28/21  2158   WBC 6.32 7.61 8.14   Hgb 15.3 16.9 20.2*   Hematocrit 43.0 47.2 55.1*   Platelets 145 175 189     Recent Labs   Lab 04/30/21  0606 04/29/21  0418 04/28/21  2158 04/27/21  0135   Sodium 134* 128* 130* 135*   Potassium 3.8 4.1 4.6 3.8   Chloride 101 98* 95* 104  CO2 26 20* 17* 19*   BUN 30.0* 62.0* 64.0* 39.0*   Creatinine 1.2 2.7* 4.0* 1.9*   Calcium 8.3* 8.7 9.6 8.9   Albumin 3.6  --  5.0 4.2   Magnesium 2.9* 3.0* 3.5*  --    Glucose 89 110* 110* 93   EGFR >60.0 33.8 21.5 50.7       Recent Labs   Lab 04/29/21  0515 04/26/21  1716   Urine Type Clean Catch Clean Catch   Color, UA Straw Yellow   Clarity, UA Clear Hazy   Specific Gravity UA 1.026 1.021   Urine pH 6.0 6.0   Nitrite, UA Negative Negative   Ketones UA Trace* Trace*   Urobilinogen, UA Normal Normal   Bilirubin, UA Negative Negative   Blood, UA Moderate* Large*   RBC, UA 0-2 0-2   WBC, UA 0-5 0-5   Granular Casts, UA  --  11 - 25*           Imaging personally reviewed, including: No results found.        Signed by: Debbe Mounts, MD

## 2021-04-30 NOTE — Discharge Instructions (Addendum)
Creatinine (Blood)  Does this test have other names?  Serum creatinine, blood creatinine  What is this test?  This is a blood test that measures how well your kidneys work. Clearing andfiltering waste products out of your blood are important kidney functions.   Creatinine is a normal waste product that builds up in your blood from using your muscles. Your body produces creatinine at a constant rate all the time, and healthy kidneys remove almost all of this creatinine. By comparing the amount of creatinine in your blood with a standard normal amount, yourhealthcare provider can get a good idea of how well your kidneys are working.   Why do I need this test?  You may need this test as part of your regular medical checkup. It's oftenincluded in routine blood tests to check your overall health.   You may need this test if you have signs or symptoms of kidney disease. Your risk for kidney disease is higher if you are an older adult, have high blood pressure, have a family history of kidney disease, or have diabetes. You may also be at increased risk if you are [place] American, Hispanic, Asian, Health and safety inspector, or American [place]. Signs and symptoms of kidney disease include:   Frequent tiredness  Swelling in your feet or ankles  Poor appetite  Puffiness around your eyes  Dry, itchy skin  Muscle cramps  Frequent urination  Painful urination  Blood or protein in your urine  If you are being treated for kidney disease, you may also need this test to seehow well your treatment is working.   What other tests might I have along with this test?  Your healthcare provider may use your blood creatinine level, along with your age, race, sex, and other factors, to calculate your glomerular filtration rate (GFR). The GFR is considered the best measure of kidney function.   Blood urea nitrogen (BUN) is another blood test that's often done with a creatinine test. BUN is a waste product that comes from the digestive process.  Healthcare providers alsomeasure it to see how your kidneys are functioning.   You may also have a test that measures the amount of creatinine in your urine.  What do my test results mean?  Test results may vary depending on your age, gender, health history, the method used for the test, and other things. Your test results may not mean you have aproblem. Ask your healthcare provider what your test results mean for you.    A normal level of creatinine depends on how much muscle mass you have. A normal level for a man is higher than it is for a woman. Children have lower levels than both men and women. Creatinine is measured in milligrams per deciliter (mg/dL). Here are the normal values by age:   0.9 to 1.3 mg/dL for adult males  0.6 to 1.1 mg/dL for adult females  0.5 to 1.0 mg/dL for children ages 7 to 18 years  0.3 to 0.7 mg/dL for children younger than age 22  If your creatinine is high, it may mean you have:  Kidney disease  Blockage in your urinary system  Muscle disease  Congestive heart failure  Diabetes  Dehydration  Overactive thyroid gland  Shock  If your creatinine is low, it may mean you have:  Muscle loss  Severe liver disease  Not enough protein in your diet  How is this test done?  The test is done with a blood sample. A needle is used to  draw blood from avein in your arm or hand.    Does this test pose any risks?  Having a blood test with a needle carries some risks. These include bleeding, infection, bruising, and feeling lightheaded. When the needle pricks your armor hand, you may feel a slight sting or pain. Afterward, the site may be sore.   What might affect my test results?  Some factors that could interfere with your creatinine test include:  Being pregnant  Eating a lot of meat recently  Taking large doses of vitamin C  Taking certain medicines, especially antibiotics  How do I get ready for this test?  You don't need to prepare for this test. Tell your healthcare provider if you are pregnant.  Be sure your healthcare provider knows about all medicines, herbs, vitamins, and supplements you are taking. This includes medicines thatdon't need a prescription and any illegal drugs you may use.   StayWell last reviewed this educational content on8/08/2018     2000-2022 [employer], [place]. All rights reserved. This information is not intended as a substitute for professional medical care. Always follow yourhealthcare professional's instructions.        Healthy Kidneys  The kidneys are 2 bean-shaped organs. Each is about the size of a fist. They sit just below the rib cage, on each side of the backbone. Healthy kidneys clean the blood. Two normal kidneys can filter wastes and excess fluid from hundreds of pints of blood each day. This helps maintain the chemical balance the body needs to stay healthy and alive. The kidneys also carry out many other vital functions. This includes controlling blood pressure, maintaining healthybones, and signaling the bone marrow to make red blood cells.    When the kidneys fail  If both kidneys fail, it's called chronic kidney disease. Waste made during normal cell functions builds up in the blood (uremia). Over time, this can threaten your health. Some people are born with 1 kidney, or donate a kidney during their lifetime. People can function normally with just 1 healthy kidney, or even a part of 1 kidney. Some conditions that causekidney damage include:  Diabetes  High blood pressure  Smoking  Injury  Immune system diseases  Hereditary conditions  Certain medicines  Removing wastes  The kidneys are part of a system that removes wastes from your body. For this system to work, the kidneys and urinary tract must do their jobs. The kidney isn't 1 large filter. Each kidney is made up of about 1 million filtering units called nephrons. Each nephron filters a small amount of blood. Each nephron has a filter called the glomerulus. The nephron also has a tubule. The nephrons work  through a 2-step process: The glomerulus filters your blood, and thetubule returns needed substances to your blood and removes wastes.    Vessels carry blood  Tiny blood vessels inside the kidneys carry blood to the filtering units (nephrons). These vessels also shrink or expand to control the pressure inside thekidneys. This keeps a constant supply of blood to the nephrons.  Filters clean blood  Blood is cleaned as it passes through the nephrons. Wastes and extra fluid are taken and removed from the body in urine. The correct amounts of clean fluid and vital chemicals (salts and enzymes) are returned to the blood.  Urinary tract removes wastes  Two tubes, called the ureters, connect the kidneys with the bladder (where urine collects). When the bladder is full, the urine is passed out of the  body through a tube (urethra) when peeing. The kidneys, ureters, and bladder make up the urinary tract.  StayWell last reviewed this educational content on12/08/2019     2000-2022 The [place], [place]. All rights reserved. This information is not intended as a substitute for professional medical care. Always follow yourhealthcare professional's instructions.        Bland Diet  Your healthcare provider may advise a bland diet if you have an upset stomach. It consists of foods that are mild and easy to digest. It's better to eat smallfrequent meals rather than 3 large meals a day.     Beverages  OK: Fruit juices, non-caffeinated teas and coffee, non-carbonated waters   Avoid: Carbonated beverage, caffeinated tea and coffee, all alcoholic beverages   Bread  OK: Refined white, wheat or rye bread, graham or soda crackers, Melba toast,plain rolls, bagels   Avoid: Whole-grain bread  Cereal  OK: Refined cereals: cooked or ready to eat   Avoid: Whole-grain cereals and granola, or those containing bran, seeds or nuts   Desserts  OK: Peanut butter and all others except those to "avoid"   Avoid: Chocolate, cocoa, coconut, popcorn,  nuts, seeds, jam, marmalade   Fruits  OK: Canned, cooked, frozen or fresh fruits without seeds or tough skin   Avoid: Olives, skin and seeds of fruit, dried fruit   Meats  OK: All fresh or preserved meat, fish and fowl   Avoid: Any that are prepared with those spices to "avoid"   Cheese and eggs  OK: Eggs, cottage cheese, cream cheese, other cheeses   Avoid: All cheeses made with those spices to "avoid"   Potatoes and pasta  OK: Potato, rice, macaroni, noodles, spaghetti   Avoid: None  Soups  OK: All soups without heavy seasoning   Avoid: Soups made with those spices to "avoid"   Vegetables  OK: Canned, cooked, fresh or frozen mildly flavored vegetables without seeds,skins or coarse fiber   Avoid: Vegetables prepared with those spices to "avoid"; skin and seeds of vegetables and those with coarse fiber, broccoli, cabbage, cauliflower,cucumber, green peppers, and corn   Spices  OK: Salt, lemon and lime juice, vinegar, all extracts, sage, cinnamon, thyme,mace, allspice, paprika   Avoid: Chili powder, cloves, pepper, seed spices, garlic, gravy pickles, highlyseasoned salad dressings   StayWell last reviewed this educational content on2/08/2020     2000-2022 The [employer], [place]. All rights reserved. This information is not intended as a substitute for professional medical care. Always follow yourhealthcare professional's instructions.        Rhabdomyolysis  Rhabdomyolysis (RM) is the breakdown of muscle tissue. This releases toxic substances into the bloodstream. The most common cause for this condition is muscle injury. The injury may be from an accident such as a car or bike accident, a crushing injury to part of your body, a fall, electrical shock, or severe burn. Very strenuous exercise such as long-distance running can also cause RM. So can lying in one position for a long time such as when unconscious. It can also happen when an older adult falls on the floor and remains there because he or she can't get up  or call for help. Certain medicines, poisons, drugs, orinfections can also cause it, as can long seizures from any cause.   RM often has no symptoms. When symptoms do occur they may include rust-coloredurine and muscle pain or weakness.   Severe RM can be fatal. It can cause serious problems with the kidneys or heart.  Symptoms of severe RM may include fever, fast heart rate, and belly (abdominal) pain. It can also affect the bloods ability to form a clot. Because of this, RM is a medical emergency. It's treated in the hospital with IV (intravenous) fluids to flush the toxins out of the body.    Home care  Rest for the next 24 hours. Don't do any strenuous activity.  Drink extra fluids to stay well hydrated and to continue flushing toxins out of your system. Unless told otherwise, drink 3 quarts of clear fluids over the next 24 hours.  Take any medicines you are prescribed as directed.    Follow-up care  Follow up with your healthcare provider, or as advised. You will likely needrepeat blood tests and an exam within the next 24 hours.   When to seek medical advice  Call your healthcare provider right away if symptoms don't improve or getworse.   Call 911  Call 911 if you have any of these symptoms:   Fast or irregular heartbeat (palpitations)  Dizziness, weakness, or fainting  Decreased urination or dark urine  Swelling, pain, or numbness in the arms, hands, legs, or feet  Confusion  Nausea or vomiting  Seizure  StayWell last reviewed this educational content on6/08/2018     2000-2022 The [place], [place]. All rights reserved. This information is not intended as a substitute for professional medical care. Always follow yourhealthcare professional's instructions.
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
## 2021-04-30 NOTE — Plan of Care (Signed)
ADULT OBSERVATION UNIT  DISCHARGE NOTE      Date Time: 04/30/21 2:38 PM  Patient Name: [facility] [name]  Attending Physician: Roxana Hires, MD      Date of Admission:   04/28/2021        Discharge Instructions:        Patient stable for discharge. Discharge instructions given, patient teachings done and verbalized understanding. All questions answered at this time. Aware to follow-up and schedule appointments as necessary. IV access and cardiac monitor discontinued. Discharge to home mother. Wheelchair  transport will be provided by staff.     COVID instructions given to Pt's mother.     Patient aware to follow-up with MD and PMD as needed.    Signed by: Arnette Schaumann, RN             Problem: Safety  Goal: Patient will be free from injury during hospitalization  04/30/2021 1438 by Arnette Schaumann, RN  Outcome: Completed  04/30/2021 0953 by Arnette Schaumann, RN  Outcome: Progressing  Flowsheets (Taken 04/29/2021 0525 by Lupita Raider, RN)  Patient will be free from injury during hospitalization:   Assess patient's risk for falls and implement fall prevention plan of care per policy   Provide and maintain safe environment   Use appropriate transfer methods   Hourly rounding  Goal: Patient will be free from infection during hospitalization  04/30/2021 1438 by Arnette Schaumann, RN  Outcome: Completed  04/30/2021 0953 by Arnette Schaumann, RN  Outcome: Progressing  Flowsheets (Taken 04/29/2021 0525 by Lupita Raider, RN)  Free from Infection during hospitalization:   Assess and monitor for signs and symptoms of infection   Monitor lab/diagnostic results   Encourage patient and family to use good hand hygiene technique     Problem: Pain  Goal: Pain at adequate level as identified by patient  04/30/2021 1438 by Arnette Schaumann, RN  Outcome: Completed  04/30/2021 0953 by Arnette Schaumann, RN  Outcome: Progressing  Flowsheets (Taken 04/29/2021 0525 by Lupita Raider, RN)  Pain at adequate level as identified by  patient:   Identify patient comfort function goal   Assess pain on admission, during daily assessment and/or before any "as needed" intervention(s)   Reassess pain within 30-60 minutes of any procedure/intervention, per Pain Assessment, Intervention, Reassessment (AIR) Cycle   Include patient/patient care companion in decisions related to pain management as needed     Problem: Side Effects from Pain Analgesia  Goal: Patient will experience minimal side effects of analgesic therapy  04/30/2021 1438 by Arnette Schaumann, RN  Outcome: Completed  04/30/2021 0953 by Arnette Schaumann, RN  Outcome: Progressing  Flowsheets (Taken 04/29/2021 0525 by Lupita Raider, RN)  Patient will experience minimal side effects of analgesic therapy:   Monitor/assess patient's respiratory status (RR depth, effort, breath sounds)   Assess for changes in cognitive function     Problem: Discharge Barriers  Goal: Patient will be discharged home or other facility with appropriate resources  04/30/2021 1438 by Arnette Schaumann, RN  Outcome: Completed  04/30/2021 0953 by Arnette Schaumann, RN  Outcome: Progressing  Flowsheets (Taken 04/29/2021 0525 by Lupita Raider, RN)  Discharge to home or other facility with appropriate resources:   Provide appropriate patient education   Initiate discharge planning     Problem: Psychosocial and Spiritual Needs  Goal: Demonstrates ability to cope with hospitalization/illness  04/30/2021 1438 by Arnette Schaumann, RN  Outcome: Completed  04/30/2021 0953 by Arnette Schaumann, RN  Outcome: Progressing  Flowsheets (Taken 04/29/2021 [phone]  by Lupita Raider, RN)  Demonstrates ability to cope with hospitalizations/illness:   Encourage verbalization of feelings/concerns/expectations   Provide quiet environment   Include patient/ patient care companion in decisions     Problem: Moderate/High Fall Risk Score >5  Goal: Patient will remain free of falls  04/30/2021 1438 by Arnette Schaumann, RN  Outcome: Completed  04/30/2021 0953 by  Arnette Schaumann, RN  Outcome: Progressing  Flowsheets  Taken 04/29/2021 2054 by Albertina Parr, RN  Moderate Risk (6-13): LOW-Fall Interventions Appropriate for Low Fall Risk  Taken 04/29/2021 0845 by Orson Eva, RN  High (Greater than 13):   HIGH-Visual cue at entrance to patient's room   HIGH-Bed alarm on at all times while patient in bed   HIGH-Utilize chair pad alarm for patient while in the chair   HIGH-Apply yellow "Fall Risk" arm band   HIGH-Pharmacy to initiate evaluation and intervention per protocol   HIGH-Initiate use of floor mats as appropriate   HIGH-Consider use of low bed     Problem: Compromised Hemodynamic Status  Goal: Vital signs and fluid balance maintained/improved  04/30/2021 1438 by Arnette Schaumann, RN  Outcome: Completed  04/30/2021 0953 by Arnette Schaumann, RN  Outcome: Progressing  Flowsheets (Taken 04/30/2021 [phone])  Vital signs and fluid balance are maintained/improved:   Position patient for maximum circulation/cardiac output   Monitor/assess vitals and hemodynamic parameters with position changes     Problem: Inadequate Gas Exchange  Goal: Adequate oxygenation and improved ventilation  04/30/2021 1438 by Arnette Schaumann, RN  Outcome: Completed  04/30/2021 0953 by Arnette Schaumann, RN  Outcome: Progressing  Flowsheets (Taken 04/30/2021 [phone])  Adequate oxygenation and improved ventilation:   Assess lung sounds   Monitor SpO2 and treat as needed     Problem: Inadequate Airway Clearance  Goal: Normal respiratory rate/effort achieved/maintained  04/30/2021 1438 by Arnette Schaumann, RN  Outcome: Completed  04/30/2021 0953 by Arnette Schaumann, RN  Outcome: Progressing  Flowsheets (Taken 04/30/2021 0953)  Normal respiratory rate/effort achieved/maintained: Plan activities to conserve energy: plan rest periods

## 2022-09-13 ENCOUNTER — Observation Stay
Admission: EM | Admit: 2022-09-13 | Discharge: 2022-09-14 | Disposition: A | Discharge status: Home / Self Care | Payer: Medicaid Managed Care Other | Attending: Student in an Organized Health Care Education/Training Program | Admitting: Student in an Organized Health Care Education/Training Program

## 2022-09-13 DIAGNOSIS — E871 Hypo-osmolality and hyponatremia: Secondary | ICD-10-CM

## 2022-09-13 DIAGNOSIS — R636 Underweight: Secondary | ICD-10-CM | POA: Insufficient documentation

## 2022-09-13 DIAGNOSIS — E860 Dehydration: Secondary | ICD-10-CM | POA: Insufficient documentation

## 2022-09-13 DIAGNOSIS — Z681 Body mass index (BMI) 19 or less, adult: Secondary | ICD-10-CM | POA: Insufficient documentation

## 2022-09-13 DIAGNOSIS — K2970 Gastritis, unspecified, without bleeding: Secondary | ICD-10-CM

## 2022-09-13 DIAGNOSIS — T730XXA Starvation, initial encounter: Secondary | ICD-10-CM

## 2022-09-13 DIAGNOSIS — R112 Nausea with vomiting, unspecified: Secondary | ICD-10-CM

## 2022-09-13 DIAGNOSIS — R7401 Elevation of levels of liver transaminase levels: Secondary | ICD-10-CM | POA: Insufficient documentation

## 2022-09-13 DIAGNOSIS — E8729 Other acidosis: Secondary | ICD-10-CM

## 2022-09-13 DIAGNOSIS — Z79899 Other long term (current) drug therapy: Secondary | ICD-10-CM | POA: Insufficient documentation

## 2022-09-13 DIAGNOSIS — N179 Acute kidney failure, unspecified: Principal | ICD-10-CM | POA: Insufficient documentation

## 2022-09-13 DIAGNOSIS — F1290 Cannabis use, unspecified, uncomplicated: Secondary | ICD-10-CM | POA: Insufficient documentation

## 2022-09-13 DIAGNOSIS — F1210 Cannabis abuse, uncomplicated: Secondary | ICD-10-CM

## 2022-09-13 DIAGNOSIS — E876 Hypokalemia: Secondary | ICD-10-CM | POA: Insufficient documentation

## 2022-09-13 LAB — COMPREHENSIVE METABOLIC PANEL
ALT: 99 U/L — ABNORMAL HIGH (ref 0–55)
AST (SGOT): 82 U/L — ABNORMAL HIGH (ref 5–41)
Albumin/Globulin Ratio: 1.3 (ref 0.9–2.2)
Albumin: 5 g/dL (ref 3.5–5.0)
Alkaline Phosphatase: 59 U/L (ref 37–117)
Anion Gap: 12 (ref 5.0–15.0)
BUN: 56 mg/dL — ABNORMAL HIGH (ref 9.0–28.0)
Bilirubin, Total: 1.9 mg/dL — ABNORMAL HIGH (ref 0.2–1.2)
CO2: 24 mEq/L (ref 17–29)
Calcium: 9.6 mg/dL (ref 8.5–10.5)
Chloride: 92 mEq/L — ABNORMAL LOW (ref 99–111)
Creatinine: 2.1 mg/dL — ABNORMAL HIGH (ref 0.5–1.5)
Globulin: 4 g/dL — ABNORMAL HIGH (ref 2.0–3.6)
Glucose: 123 mg/dL — ABNORMAL HIGH (ref 70–100)
Potassium: 3.8 mEq/L (ref 3.5–5.3)
Protein, Total: 9 g/dL — ABNORMAL HIGH (ref 6.0–8.3)
Sodium: 128 mEq/L — ABNORMAL LOW (ref 135–145)
eGFR: 42.3 mL/min/{1.73_m2} — AB (ref >=60)

## 2022-09-13 LAB — CBC AND DIFFERENTIAL
Absolute NRBC: 0 10*3/uL (ref 0.00–0.00)
Basophils Absolute Automated: 0.03 10*3/uL (ref 0.00–0.08)
Basophils Automated: 0.4 %
Eosinophils Absolute Automated: 0 10*3/uL (ref 0.00–0.44)
Eosinophils Automated: 0 %
Hematocrit: 50.1 % — ABNORMAL HIGH (ref 37.6–49.6)
Hgb: 18.5 g/dL — ABNORMAL HIGH (ref 12.5–17.1)
Immature Granulocytes Absolute: 0.01 10*3/uL (ref 0.00–0.07)
Immature Granulocytes: 0.1 %
Instrument Absolute Neutrophil Count: 4.5 10*3/uL (ref 1.10–6.33)
Lymphocytes Absolute Automated: 2.31 10*3/uL (ref 0.42–3.22)
Lymphocytes Automated: 29.4 %
MCH: 31.6 pg (ref 25.1–33.5)
MCHC: 36.9 g/dL — ABNORMAL HIGH (ref 31.5–35.8)
MCV: 85.5 fL (ref 78.0–96.0)
MPV: 11.7 fL (ref 8.9–12.5)
Monocytes Absolute Automated: 1.01 10*3/uL — ABNORMAL HIGH (ref 0.21–0.85)
Monocytes: 12.8 %
Neutrophils Absolute: 4.5 10*3/uL (ref 1.10–6.33)
Neutrophils: 57.3 %
Nucleated RBC: 0 /100 WBC (ref 0.0–0.0)
Platelets: 232 10*3/uL (ref 142–346)
RBC: 5.86 10*6/uL (ref 4.20–5.90)
RDW: 13 % (ref 11–15)
WBC: 7.86 10*3/uL (ref 3.10–9.50)

## 2022-09-13 LAB — ETHANOL: Alcohol: NOT DETECTED mg/dL

## 2022-09-13 LAB — LIPASE: Lipase: 35 U/L (ref 8–78)

## 2022-09-13 MED ORDER — DEXTROSE 10 % IV BOLUS
12.5000 g | INTRAVENOUS | Status: DC | PRN
Start: 2022-09-13 — End: 2022-09-14

## 2022-09-13 MED ORDER — PROCHLORPERAZINE EDISYLATE 10 MG/2ML IJ SOLN
10.0000 mg | INTRAMUSCULAR | Status: DC | PRN
Start: 2022-09-13 — End: 2022-09-14

## 2022-09-13 MED ORDER — SODIUM CHLORIDE 0.9 % IV SOLN
INTRAVENOUS | Status: DC
Start: 2022-09-13 — End: 2022-09-13

## 2022-09-13 MED ORDER — DIPHENHYDRAMINE HCL 50 MG/ML IJ SOLN
25.0000 mg | Freq: Once | INTRAMUSCULAR | Status: AC
Start: 2022-09-13 — End: 2022-09-13
  Administered 2022-09-13: 25 mg via INTRAVENOUS
  Filled 2022-09-13: qty 1

## 2022-09-13 MED ORDER — SODIUM CHLORIDE 0.9 % IV BOLUS
1000.0000 mL | Freq: Once | INTRAVENOUS | Status: AC
Start: 2022-09-14 — End: 2022-09-14
  Administered 2022-09-14: 1000 mL via INTRAVENOUS

## 2022-09-13 MED ORDER — CAPSAICIN 0.025 % EX CREA
TOPICAL_CREAM | Freq: Four times a day (QID) | CUTANEOUS | Status: DC
Start: 2022-09-13 — End: 2022-09-14
  Filled 2022-09-13: qty 60

## 2022-09-13 MED ORDER — NICOTINE 21 MG/24HR TD PT24
1.0000 | MEDICATED_PATCH | Freq: Every day | TRANSDERMAL | Status: DC | PRN
Start: 2022-09-13 — End: 2022-09-14

## 2022-09-13 MED ORDER — GLUCAGON 1 MG IJ SOLR (WRAP)
1.0000 mg | INTRAMUSCULAR | Status: DC | PRN
Start: 2022-09-13 — End: 2022-09-14

## 2022-09-13 MED ORDER — PANTOPRAZOLE SODIUM 40 MG IV SOLR
40.0000 mg | Freq: Every day | INTRAVENOUS | Status: DC
Start: 2022-09-13 — End: 2022-09-14
  Administered 2022-09-13 – 2022-09-14 (×2): 40 mg via INTRAVENOUS
  Filled 2022-09-13 (×2): qty 40

## 2022-09-13 MED ORDER — FAMOTIDINE 10 MG/ML IV SOLN (WRAP)
10.0000 mg | Freq: Two times a day (BID) | INTRAVENOUS | Status: DC
Start: 2022-09-13 — End: 2022-09-13

## 2022-09-13 MED ORDER — GLUCOSE 40 % PO GEL (WRAP)
15.0000 g | ORAL | Status: DC | PRN
Start: 2022-09-13 — End: 2022-09-14

## 2022-09-13 MED ORDER — DEXTROSE 50 % IV SOLN
50.0000 g | INTRAVENOUS | Status: DC | PRN
Start: 2022-09-13 — End: 2022-09-14

## 2022-09-13 MED ORDER — ONDANSETRON 4 MG PO TBDP
4.0000 mg | ORAL_TABLET | Freq: Four times a day (QID) | ORAL | Status: DC | PRN
Start: 2022-09-13 — End: 2022-09-14

## 2022-09-13 MED ORDER — SODIUM CHLORIDE 0.9 % IV SOLN
INTRAVENOUS | Status: DC
Start: 2022-09-13 — End: 2022-09-14

## 2022-09-13 MED ORDER — DROPERIDOL 2.5 MG/ML IJ SOLN
1.2500 mg | Freq: Once | INTRAMUSCULAR | Status: AC
Start: 2022-09-13 — End: 2022-09-13
  Administered 2022-09-13: 1.25 mg via INTRAVENOUS
  Filled 2022-09-13: qty 2

## 2022-09-13 MED ORDER — POLYETHYLENE GLYCOL 3350 17 G PO PACK
17.0000 g | PACK | Freq: Every day | ORAL | Status: DC | PRN
Start: 2022-09-13 — End: 2022-09-14

## 2022-09-13 MED ORDER — MELATONIN 3 MG PO TABS
3.0000 mg | ORAL_TABLET | Freq: Every evening | ORAL | Status: DC | PRN
Start: 2022-09-13 — End: 2022-09-14

## 2022-09-13 MED ORDER — SALINE SPRAY 0.65 % NA SOLN
2.0000 | NASAL | Status: DC | PRN
Start: 2022-09-13 — End: 2022-09-14

## 2022-09-13 MED ORDER — SODIUM CHLORIDE 0.9 % IV BOLUS
1000.0000 mL | Freq: Once | INTRAVENOUS | Status: AC
Start: 2022-09-13 — End: 2022-09-13
  Administered 2022-09-13: 1000 mL via INTRAVENOUS

## 2022-09-13 MED ORDER — BENZONATATE 100 MG PO CAPS
100.0000 mg | ORAL_CAPSULE | Freq: Three times a day (TID) | ORAL | Status: DC | PRN
Start: 2022-09-13 — End: 2022-09-14

## 2022-09-13 MED ORDER — SUCRALFATE 1 GM/10ML PO SUSP
1.0000 g | Freq: Four times a day (QID) | ORAL | Status: DC | PRN
Start: 2022-09-13 — End: 2022-09-14

## 2022-09-13 MED ORDER — DIPHENHYDRAMINE HCL 25 MG PO CAPS
25.0000 mg | ORAL_CAPSULE | Freq: Four times a day (QID) | ORAL | Status: DC | PRN
Start: 2022-09-13 — End: 2022-09-14

## 2022-09-13 MED ORDER — SENNOSIDES-DOCUSATE SODIUM 8.6-50 MG PO TABS
2.0000 | ORAL_TABLET | Freq: Two times a day (BID) | ORAL | Status: DC | PRN
Start: 2022-09-13 — End: 2022-09-14

## 2022-09-13 MED ORDER — NALOXONE HCL 0.4 MG/ML IJ SOLN (WRAP)
0.4000 mg | INTRAMUSCULAR | Status: DC | PRN
Start: 2022-09-13 — End: 2022-09-14

## 2022-09-13 MED ORDER — DIPHENHYDRAMINE HCL 50 MG/ML IJ SOLN
25.0000 mg | Freq: Four times a day (QID) | INTRAMUSCULAR | Status: DC | PRN
Start: 2022-09-13 — End: 2022-09-14
  Administered 2022-09-13: 25 mg via INTRAVENOUS
  Filled 2022-09-13: qty 1

## 2022-09-13 MED ORDER — DROPERIDOL 2.5 MG/ML IJ SOLN
1.2500 mg | Freq: Four times a day (QID) | INTRAMUSCULAR | Status: DC | PRN
Start: 2022-09-13 — End: 2022-09-14

## 2022-09-13 MED ORDER — BENZOCAINE-MENTHOL MT LOZG (WRAP)
1.0000 | LOZENGE | OROMUCOSAL | Status: DC | PRN
Start: 2022-09-13 — End: 2022-09-14

## 2022-09-13 MED ORDER — SODIUM CHLORIDE 0.9 % IV BOLUS
1000.0000 mL | Freq: Once | INTRAVENOUS | Status: AC
Start: 2022-09-13 — End: 2022-09-14
  Administered 2022-09-13: 1000 mL via INTRAVENOUS

## 2022-09-13 MED ORDER — LIDOCAINE 5 % EX PTCH
1.0000 | MEDICATED_PATCH | Freq: Every day | CUTANEOUS | Status: DC | PRN
Start: 2022-09-13 — End: 2022-09-14

## 2022-09-13 MED ORDER — ONDANSETRON HCL 4 MG/2ML IJ SOLN
4.0000 mg | Freq: Four times a day (QID) | INTRAMUSCULAR | Status: DC | PRN
Start: 2022-09-13 — End: 2022-09-14

## 2022-09-13 MED ORDER — CARBOXYMETHYLCELLULOSE SOD PF 0.5 % OP SOLN
1.0000 [drp] | Freq: Three times a day (TID) | OPHTHALMIC | Status: DC | PRN
Start: 2022-09-13 — End: 2022-09-14

## 2022-09-13 MED ORDER — ACETAMINOPHEN 500 MG PO TABS
1000.0000 mg | ORAL_TABLET | Freq: Four times a day (QID) | ORAL | Status: DC | PRN
Start: 2022-09-13 — End: 2022-09-14

## 2022-09-13 NOTE — ED Notes (Signed)
Bed: 8 H  Expected date:   Expected time:   Means of arrival:   Comments:  7 here

## 2022-09-13 NOTE — ED to IP RN Note (Signed)
[place] EMERGENCY DEPT  ED NURSING NOTE FOR THE RECEIVING INPATIENT NURSE   ED NURSE Joey  Lenon Ahmadi   [facility] [id number]   ED CHARGE RN Kathlee Nations   ADMISSION INFORMATION   Gentry Pilson Kilo Eshelman. is a 32 y.o. male admitted with an ED diagnosis of:    1. AKI (acute kidney injury)    2. Dehydration         Isolation: None   Allergies: Pollen extract   Holding Orders confirmed? Yes   Belongings Documented? No   Home medications sent to pharmacy confirmed? N/A   NURSING CARE   Patient Comes From:   Mental Status: Home Independent  alert and oriented   ADL: Independent with all ADLs   Ambulation: no difficulty   Pertinent Information  and Safety Concerns:     Broset Violence Risk Level: Low Patient presenting to ED for c/o periumbilical abdominal pain, nausea/vomiting ongoing since Thursday. Reports he was discharged from [facility] on Thursday after stay for cannabinoid hyperemesis. States that since his discharge he has experienced abdominal pain, nausea/vomiting. Reports he has been tolerating small amounts of PO intake. PMH: TBI. Pt AAOx4.      CT / NIH   CT Head ordered on this patient?  N/A   NIH/Dysphagia assessment done prior to admission? N/A   VITAL SIGNS (at the time of this note)      Vitals:    09/13/22 2245   BP: 138/83   Pulse: (!) 107   Resp: (!) 23   Temp:    SpO2: 96%

## 2022-09-13 NOTE — ED Provider Notes (Addendum)
 [place] [facility] EMERGENCY DEPARTMENT ATTENDING PHYSICIAN NOTE     CLINICAL SUMMARY          Diagnosis:    .     Final diagnoses:   AKI (acute kidney injury)   Dehydration         Disposition:       ED Disposition       ED Disposition   Observation    Condition   --    Date/Time   Mon Sep 13, 2022 10:56 PM    Comment   Admitting Physician: Lianne Bushy [[id number]]   Service:: Medicine [[id number]]   Estimated Length of Stay: < 2 midnights   Tentative Discharge Plan?: Home or Self Care [1]   Does patient need telemetry?: No   Was admission to another facility considered?: No   Detail:: [facility] resource required                    Discharge Prescriptions    None                                           CLINICAL INFORMATION        HPI:         32 y.o. male p/w vomiting  Ongoing for about 2 days  Was discharged from [facility] about 4 days ago after being admitted for cannabinoid hyperemesis  No fevers  Hx of appendectomy (no other abd surgeries)  No fevers      History obtained from:  Patient, mother          ROS:      Pertinent Positives and Negatives noted in the HPI.    All other systems reviewed and are negative        Physical Exam:      Pulse (!) 129  BP 106/80  Resp 18  SpO2 97 %  Temp 97.9 F (36.6 C)    Nursing note and vitals reviewed.    Constitutional:  Comfortable. Full sentences.   Psych:  Normal affect. Cooperative.   Eyes: No conjunctival injection. No discharge.  Ears, Nose, Mouth and Throat:Tolerating secretions.  No hoarseness.  Respiratory: No respiratory distress. No retractions.    Cardiovascular:    Abdomen: Soft. Non-tender    Musculoskeletal:           Neck:           Back:            Upper Extremity:           Lower Extremity:   Neurological:   Integumentary:   GU:   Lymphatic:                PAST HISTORY        Primary Care Provider: Patsy Lager, MD        PMH/PSH:      [name]. 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  He has a past surgical history that includes APPENDECTOMY (OPEN).      Social/Family History:      He reports that he has quit smoking. His smoking use  included cigarettes. He has never used smokeless tobacco. He reports current alcohol use. He reports current drug use. Drug: Marijuana.  His family history is not on file.      Listed Medications on Arrival:    .     Home Medications       Med List Status: In Progress Set By: Caralee Ates, RN at 09/13/2022 10:01 PM              capsaicin (ZOSTRIX) 0.025 % cream     Apply topically 4 (four) times daily as needed (cannabinoid hyperemsis syndrome)     ondansetron (ZOFRAN) 4 MG tablet     Take 1 tablet (4 mg total) by mouth every 8 (eight) hours as needed for Nausea     sertraline (ZOLOFT) 25 MG tablet     Take 1 tablet (25 mg) by mouth daily          Allergies: He is allergic to pollen extract.            VISIT INFORMATION        Medical Decision Making & Clinical Course:      I reviewed the vital signs, nursing notes, past medical history, past surgical history, family history and social history.    At 10:04 PM:  32 y.o. male p/w vomiting for the past few days.  Admit to no PO intake for the past 2 days.  In ER, pt is afebrile and hemodynamically normal. Looks very dry.  Labs significant for AKI (Cr 2.1) and hyponatremia.  Soft benign abdomen.  Hx of a few admission for cannabinoid hyperemesis. Minimal improvement w/ droperidol.  Will give IV fluids and more anti-emetics.       At 12:58 AM:  Case d/w DR Rachel Moulds (hospitalist) who will admit to floor for further care.                 Medical Decision Making  Problems Addressed:  AKI (acute kidney injury): acute illness or injury that  poses a threat to life or bodily functions  Dehydration: acute illness or injury that poses a threat to life or bodily functions    Amount and/or Complexity of Data Reviewed  Independent Historian: parent  Labs: ordered. Decision-making details documented in ED Course.    Risk  Prescription drug management.          Medications Given in the ED:    .     ED Medication Orders (From admission, onward)      Start Ordered     Status Ordering Provider    09/14/22 0000 09/13/22 2300  sodium chloride 0.9 % bolus 1,000 mL  Once        Route: Intravenous  Ordered Dose: 1,000 mL       Last MAR action: New Bag MAJUMDER, ARMAN S    09/13/22 2345 09/13/22 2302  pantoprazole (PROTONIX) injection 40 mg  Daily        Route: Intravenous  Ordered Dose: 40 mg       Last MAR action: Given MAJUMDER, ARMAN S    09/13/22 2311 09/13/22 2311  droperidol (INAPSINE) injection 1.25 mg  Every 6 hours PRN        Route: Intravenous  Ordered Dose: 1.25 mg       Acknowledged Janie Morning S    09/13/22 2311 09/13/22 2311  diphenhydrAMINE (BENADRYL) capsule 25 mg  Every 6 hours PRN        Route: Oral  Ordered Dose: 25  mg      See Hyperspace for full Linked Orders Report.    Last MAR action: See Alternative Janie Morning S    09/13/22 2311 09/13/22 2311  diphenhydrAMINE (BENADRYL) injection 25 mg  Every 6 hours PRN        Route: Intravenous  Ordered Dose: 25 mg      See Hyperspace for full Linked Orders Report.    Last MAR action: Given MAJUMDER, ARMAN S    09/13/22 2310 09/13/22 2310  lidocaine (LIDODERM) 5 % 1 patch  Daily PRN        Route: Transdermal  Ordered Dose: 1 patch       Acknowledged MAJUMDER, ARMAN S    09/13/22 2310 09/13/22 2310  naloxone (NARCAN) injection 0.4 mg  As needed        Route: Intravenous  Ordered Dose: 0.4 mg       Acknowledged MAJUMDER, ARMAN S    09/13/22 2310 09/13/22 2310  dextrose (GLUCOSE) 40 % oral gel 15 g of glucose  As needed        Route: Oral  Ordered Dose: 15 g of glucose      See Hyperspace for full Linked  Orders Report.    Acknowledged MAJUMDER, ARMAN S    09/13/22 2310 09/13/22 2310  dextrose (D10W) 10% bolus 125 mL  As needed        Route: Intravenous  Ordered Dose: 12.5 g      See Hyperspace for full Linked Orders Report.    Acknowledged MAJUMDER, ARMAN S    09/13/22 2310 09/13/22 2310  dextrose 50 % bolus 50 g  As needed        Route: Intravenous  Ordered Dose: 50 g      See Hyperspace for full Linked Orders Report.    Acknowledged MAJUMDER, ARMAN S    09/13/22 2310 09/13/22 2310  glucagon (rDNA) (GLUCAGEN) injection 1 mg  As needed        Route: Intramuscular  Ordered Dose: 1 mg      See Hyperspace for full Linked Orders Report.    Acknowledged MAJUMDER, ARMAN S    09/13/22 2310 09/13/22 2310  ondansetron (ZOFRAN-ODT) disintegrating tablet 4 mg  Every 6 hours PRN        Route: Oral  Ordered Dose: 4 mg      See Hyperspace for full Linked Orders Report.    Acknowledged MAJUMDER, ARMAN S    09/13/22 2310 09/13/22 2310  ondansetron (ZOFRAN) injection 4 mg  Every 6 hours PRN        Route: Intravenous  Ordered Dose: 4 mg      See Hyperspace for full Linked Orders Report.    Acknowledged Docia Chuck, ARMAN S    09/13/22 2310 09/13/22 2310  acetaminophen (TYLENOL) tablet 1,000 mg  Every 6 hours PRN        Route: Oral  Ordered Dose: 1,000 mg       Acknowledged MAJUMDER, ARMAN S    09/13/22 2310 09/13/22 2310  senna-docusate (PERICOLACE) 8.6-50 MG per tablet 2 tablet  2 times daily PRN        Route: Oral  Ordered Dose: 2 tablet       Acknowledged MAJUMDER, ARMAN S    09/13/22 2310 09/13/22 2310  polyethylene glycol (MIRALAX) packet 17 g  Daily PRN        Route: Oral  Ordered Dose: 17 g       Acknowledged MAJUMDER, ARMAN S  09/13/22 2310 09/13/22 2310  nicotine (NICODERM CQ) 21 MG/24HR patch 1 patch  Daily PRN        Route: Transdermal  Ordered Dose: 1 patch       Acknowledged MAJUMDER, ARMAN S    09/13/22 2310 09/13/22 2310  melatonin tablet 3 mg  At bedtime PRN        Route: Oral  Ordered Dose: 3 mg       Acknowledged  MAJUMDER, ARMAN S    09/13/22 2310 09/13/22 2310  saline (OCEAN NASAL SPRAY) 0.65 % nasal solution 2 spray  Every 4 hours PRN        Route: Each Nare  Ordered Dose: 2 spray       Acknowledged MAJUMDER, ARMAN S    09/13/22 2310 09/13/22 2310  carboxymethylcellulose (PF) (REFRESH PLUS) 0.5 % ophthalmic solution 1 drop  3 times daily PRN        Route: Both Eyes  Ordered Dose: 1 drop       Acknowledged MAJUMDER, ARMAN S    09/13/22 2310 09/13/22 2310  benzocaine-menthol (CEPACOL/CHLORASEPTIC) lozenge 1 lozenge  Every 2 hours PRN        Route: Buccal  Ordered Dose: 1 lozenge       Acknowledged MAJUMDER, ARMAN S    09/13/22 2310 09/13/22 2310  benzonatate (TESSALON) capsule 100 mg  3 times daily PRN        Route: Oral  Ordered Dose: 100 mg       Acknowledged Janie Morning S    09/13/22 2302 09/13/22 2301  0.9% NaCl infusion  Continuous        Route: Intravenous       Acknowledged MAJUMDER, ARMAN S    09/13/22 2301 09/13/22 2301  sucralfate (CARAFATE) 1 GM/10ML oral suspension 1 g  4 times daily PRN        Route: Oral  Ordered Dose: 1 g       Acknowledged MAJUMDER, ARMAN S    09/13/22 2301 09/13/22 2300  capsaicin (ZOSTRIX) 0.025 % topical cream  4 times daily        Route: Topical       Acknowledged MAJUMDER, ARMAN S    09/13/22 2301 09/13/22 2300    Every 12 hours scheduled        Route: Intravenous  Ordered Dose: 10 mg       Discontinued MAJUMDER, ARMAN S    09/13/22 2301 09/13/22 2300  sodium chloride 0.9 % bolus 1,000 mL  Once        Route: Intravenous  Ordered Dose: 1,000 mL       Last MAR action: Stopped MAJUMDER, ARMAN S    09/13/22 2259 09/13/22 2258    Continuous        Route: Intravenous       Discontinued MAJUMDER, ARMAN S    09/13/22 2258 09/13/22 2258  prochlorperazine (COMPAZINE) injection 10 mg  Every 4 hours PRN        Route: Intravenous  Ordered Dose: 10 mg       Acknowledged Janie Morning S    09/13/22 2221 09/13/22 2220  sodium chloride 0.9 % bolus 1,000 mL  Once        Route: Intravenous  Ordered  Dose: 1,000 mL       Last MAR action: Stopped Mazen Marcin    09/13/22 2143 09/13/22 2142  droperidol (INAPSINE) injection 1.25 mg  Once        Route: Intravenous  Ordered Dose:  1.25 mg       Last MAR action: Given Jaymari Cromie    09/13/22 2143 09/13/22 2142  diphenhydrAMINE (BENADRYL) injection 25 mg  Once        Route: Intravenous  Ordered Dose: 25 mg       Last MAR action: Given Zavien Clubb              Procedures:      Procedures      Interpretations:          Critical Care Time (not including procedures): 40 minutes  Due to the high risk of critical illness or multi-organ failure at initial presentation and/or during ED course.   System(s) at risk for compromise:  circulatory, respiratory, CNS, renal, hepatic, and metabolic  Critical Diagnosis:   1. AKI (acute kidney injury)    2. Dehydration       The patient was Hypotensive:   No    The patient was Hypoxic:   No    This does not including time spent performing other reported procedures or services.  Critical care time involved full attention to the patient's condition and included:   Review of nursing notes - Yes  Documentation time - Yes  Care, transfer of care, and discharge plans - Yes  Review of medications, allergies, and vital signs - Yes   Ordering, interpreting, and reviewing diagnostic studies/tab tests - Yes               RESULTS        Lab Results:      Results       Procedure Component Value Units Date/Time    CBC and differential [[id number]]  (Abnormal) Collected: 09/13/22 2150    Specimen: Blood Updated: 09/13/22 2253     WBC 7.86 x10 3/uL      Hgb 18.5 g/dL      Hematocrit 84.6 %      Platelets 232 x10 3/uL      RBC 5.86 x10 6/uL      MCV 85.5 fL      MCH 31.6 pg      MCHC 36.9 g/dL      RDW 13 %      MPV 11.7 fL      Instrument Absolute Neutrophil Count 4.50 x10 3/uL      Neutrophils 57.3 %      Lymphocytes Automated 29.4 %      Monocytes 12.8 %      Eosinophils Automated 0.0 %      Basophils Automated 0.4 %      Immature Granulocytes 0.1 %       Nucleated RBC 0.0 /100 WBC      Neutrophils Absolute 4.50 x10 3/uL      Lymphocytes Absolute Automated 2.31 x10 3/uL      Monocytes Absolute Automated 1.01 x10 3/uL      Eosinophils Absolute Automated 0.00 x10 3/uL      Basophils Absolute Automated 0.03 x10 3/uL      Immature Granulocytes Absolute 0.01 x10 3/uL      Absolute NRBC 0.00 x10 3/uL     Comprehensive metabolic panel [[id number]]  (Abnormal) Collected: 09/13/22 2150    Specimen: Blood Updated: 09/13/22 2220     Glucose 123 mg/dL      BUN 32.4 mg/dL      Creatinine 2.1 mg/dL      Sodium 401 mEq/L      Potassium 3.8 mEq/L  Chloride 92 mEq/L      CO2 24 mEq/L      Calcium 9.6 mg/dL      Protein, Total 9.0 g/dL      Albumin 5.0 g/dL      AST (SGOT) 82 U/L      ALT 99 U/L      Alkaline Phosphatase 59 U/L      Bilirubin, Total 1.9 mg/dL      Globulin 4.0 g/dL      Albumin/Globulin Ratio 1.3     Anion Gap 12.0     eGFR 42.3 mL/min/1.73 m2     Lipase [[id number]] Collected: 09/13/22 2150    Specimen: Blood Updated: 09/13/22 2220     Lipase 35 U/L     Ethanol (Alcohol) Level [[id number]] Collected: 09/13/22 2150    Specimen: Blood Updated: 09/13/22 2216     Alcohol NONE DETECTED mg/dL                 Radiology Results:      No orders to display               Scribe Attestation:      No scribe was involved in the care of this patient.          Delorse Lek, MD  09/14/22 [phone]
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
## 2022-09-14 DIAGNOSIS — N179 Acute kidney failure, unspecified: Secondary | ICD-10-CM

## 2022-09-14 LAB — URINALYSIS REFLEX TO MICROSCOPIC EXAM - REFLEX TO CULTURE
Bilirubin, UA: NEGATIVE
Blood, UA: NEGATIVE
Glucose, UA: NEGATIVE
Ketones UA: NEGATIVE
Leukocyte Esterase, UA: NEGATIVE
Nitrite, UA: NEGATIVE
Specific Gravity UA: 1.019 (ref 1.001–1.035)
Urine pH: 6 (ref 5.0–8.0)
Urobilinogen, UA: NORMAL mg/dL

## 2022-09-14 LAB — CBC
Absolute NRBC: 0 10*3/uL (ref 0.00–0.00)
Hematocrit: 42.5 % (ref 37.6–49.6)
Hgb: 15.3 g/dL (ref 12.5–17.1)
MCH: 31.7 pg (ref 25.1–33.5)
MCHC: 36 g/dL — ABNORMAL HIGH (ref 31.5–35.8)
MCV: 88 fL (ref 78.0–96.0)
MPV: 11.7 fL (ref 8.9–12.5)
Nucleated RBC: 0 /100 WBC (ref 0.0–0.0)
Platelets: 181 10*3/uL (ref 142–346)
RBC: 4.83 10*6/uL (ref 4.20–5.90)
RDW: 13 % (ref 11–15)
WBC: 7.64 10*3/uL (ref 3.10–9.50)

## 2022-09-14 LAB — HEPATITIS C ANTIBODY, TOTAL: Hepatitis C, AB: NONREACTIVE

## 2022-09-14 LAB — BASIC METABOLIC PANEL
Anion Gap: 7 (ref 5.0–15.0)
BUN: 33 mg/dL — ABNORMAL HIGH (ref 9.0–28.0)
CO2: 24 mEq/L (ref 17–29)
Calcium: 8.1 mg/dL — ABNORMAL LOW (ref 8.5–10.5)
Chloride: 100 mEq/L (ref 99–111)
Creatinine: 1.3 mg/dL (ref 0.5–1.5)
Glucose: 94 mg/dL (ref 70–100)
Potassium: 3.4 mEq/L — ABNORMAL LOW (ref 3.5–5.3)
Sodium: 131 mEq/L — ABNORMAL LOW (ref 135–145)
eGFR: >60 mL/min/{1.73_m2} (ref >=60)

## 2022-09-14 LAB — RAPID DRUG SCREEN, URINE
Barbiturate Screen, UR: NEGATIVE
Benzodiazepine Screen, UR: NEGATIVE
Cannabinoid Screen, UR: POSITIVE — AB
Cocaine, UR: NEGATIVE
Opiate Screen, UR: NEGATIVE
PCP Screen, UR: NEGATIVE
Urine Amphetamine Screen: NEGATIVE
Urine Fentanyl: NEGATIVE

## 2022-09-14 LAB — HIV-1/2, ANTIGEN AND ANTIBODY WITH REFLEX TO CONFIRMATION: HIV Ag/Ab, 4th Generation: NONREACTIVE

## 2022-09-14 LAB — HEPATITIS B CORE ANTIBODY, TOTAL: Hepatitis B Core Total Antibody: NONREACTIVE

## 2022-09-14 MED ORDER — POTASSIUM CHLORIDE CRYS ER 20 MEQ PO TBCR
40.0000 meq | EXTENDED_RELEASE_TABLET | Freq: Once | ORAL | Status: AC
Start: 2022-09-14 — End: 2022-09-14
  Administered 2022-09-14: 40 meq via ORAL
  Filled 2022-09-14: qty 2

## 2022-09-14 MED ORDER — POTASSIUM CHLORIDE 10 MEQ/100ML IV SOLN (WRAP)
10.0000 meq | Freq: Once | INTRAVENOUS | Status: AC
Start: 2022-09-14 — End: 2022-09-14
  Administered 2022-09-14: 10 meq via INTRAVENOUS
  Filled 2022-09-14: qty 100

## 2022-09-14 NOTE — Progress Notes (Signed)
CM [place] Summary:      Patient to [facility] home w/o HH needs. Per patient his mother will transport him after 5 pm. Patient agreeable to [place] plan. RN aware of [place] plan.         09/14/22 1440   Discharge Disposition   Patient preference/choice provided? Yes   Physical Discharge Disposition Home   Patient/Family/POA notified of transfer plan Yes   Patient agreeable to discharge plan/expected d/c date? Yes   Family/POA agreeable to discharge plan/expected d/c date? Yes   Bedside nurse notified of transport plan? Yes   CM Interventions   Multidisciplinary rounds/family meeting before d/c? Yes   Medicare Checklist   Is this a Medicare patient? No     Riki Sheer, RN Case Freight forwarder

## 2022-09-14 NOTE — Nursing Progress Note (Signed)
Pt came to the floor at 5:30 am, denies pain. BP is elevated dr Jonetta Speak is notified and ordered to monitor the patient for 1 more hour and recheck blood pressure.

## 2022-09-14 NOTE — Discharge Instr - AVS First Page (Addendum)
Reason for your Hospital Admission:  Nausea and vomiting      Instructions for after your discharge:  -Establish care with PCP  -Important to stop using marijuana  -No new medications prescribed on discharge

## 2022-09-14 NOTE — Progress Notes (Signed)
09/14/22 0954   CM Review   Acknowledgment of Outpatient/Observation Observation letter given     Janalyn Harder, CMS  Case Management Specialist  [facility] Case Management  [phone]

## 2022-09-14 NOTE — Discharge Summary (Addendum)
PROGRESS NOTE/DISCHARGE SUMMARY      Date Time: 09/14/22 2:39 PM  Patient Name: [facility].  Attending Physician: Elmo Putt, MD  Primary Care Physician: Junius Roads, MD    Date of Admission:   09/13/2022    Date of Discharge:   09/14/2022    Discharge Dx:   AKI, resolved  Hyponatremia, moderate. Improved  Hypokalemia  Hemoconcentration, resolved  Hx of Hyperemesis syndrome  Transaminitis    Consultations:   Treatment Team: Attending Provider: Elmo Putt, MD; Registered Nurse: Vonna Kotyk, RN     Procedures/Radiology performed:   Radiology: all results from this admission  No results found.      Labs     Results       Procedure Component Value Units Date/Time    Hepatitis B core antibody, total [[id number]] Collected: 09/14/22 0902    Specimen: Blood Updated: 09/14/22 1340     Hepatitis B Core Total AB Nonreactive    Hepatitis C (HCV) antibody, Total [[id number]] Collected: 09/14/22 0902    Specimen: Blood Updated: 09/14/22 1340     Hepatitis C, AB Non-Reactive    Rapid drug screen, urine [[id number]]  (Abnormal) Collected: 09/14/22 0902    Specimen: Urine Updated: 09/14/22 1038     Urine Amphetamine Screen Negative     Barbiturate Screen, UR Negative     Benzodiazepine Screen, UR Negative     Cannabinoid Screen, UR Positive     Cocaine, UR Negative     Urine Fentanyl Negative     Opiate Screen, UR Negative     PCP Screen, UR Negative    HIV-1/2 Ag/Ab 4th Gen. w/ Reflex [[id number]] Collected: 09/14/22 0902    Specimen: Blood Updated: 09/14/22 1023     HIV Ag/Ab, 4th Generation Non-Reactive    Basic Metabolic Panel [[id number]]  (Abnormal) Collected: 09/14/22 0902    Specimen: Blood Updated: 09/14/22 1002     Glucose 94 mg/dL      BUN 33.0 mg/dL      Creatinine 1.3 mg/dL      Calcium 8.1 mg/dL      Sodium 131 mEq/L      Potassium 3.4 mEq/L      Chloride 100 mEq/L      CO2 24 mEq/L      Anion Gap 7.0     eGFR >60.0 mL/min/1.73 m2     CBC without differential [[id number]]  (Abnormal) Collected:  09/14/22 0902    Specimen: Blood Updated: 09/14/22 0951     WBC 7.64 x10 3/uL      Hgb 15.3 g/dL      Hematocrit 42.5 %      Platelets 181 x10 3/uL      RBC 4.83 x10 6/uL      MCV 88.0 fL      MCH 31.7 pg      MCHC 36.0 g/dL      RDW 13 %      MPV 11.7 fL      Nucleated RBC 0.0 /100 WBC      Absolute NRBC 0.00 x10 3/uL     Urinalysis Reflex to Microscopic Exam- Reflex to Culture [[id number]]  (Abnormal) Collected: 09/14/22 0902    Specimen: Urine, Clean Catch Updated: 09/14/22 0932     Urine Type Urine, Clean Ca     Color, UA Straw     Clarity, UA Clear     Specific Gravity UA 1.019     Urine pH 6.0     Leukocyte Esterase,  UA Negative     Nitrite, UA Negative     Protein, UR 10= Trace     Glucose, UA Negative     Ketones UA Negative     Urobilinogen, UA Normal mg/dL      Bilirubin, UA Negative     Blood, UA Negative     RBC, UA 0-2 /hpf      WBC, UA 0-5 /hpf      Squamous Epithelial Cells, Urine 0-5 /hpf     CBC and differential [[id number]]  (Abnormal) Collected: 09/13/22 2150    Specimen: Blood Updated: 09/13/22 2253     WBC 7.86 x10 3/uL      Hgb 18.5 g/dL      Hematocrit 40.9 %      Platelets 232 x10 3/uL      RBC 5.86 x10 6/uL      MCV 85.5 fL      MCH 31.6 pg      MCHC 36.9 g/dL      RDW 13 %      MPV 11.7 fL      Instrument Absolute Neutrophil Count 4.50 x10 3/uL      Neutrophils 57.3 %      Lymphocytes Automated 29.4 %      Monocytes 12.8 %      Eosinophils Automated 0.0 %      Basophils Automated 0.4 %      Immature Granulocytes 0.1 %      Nucleated RBC 0.0 /100 WBC      Neutrophils Absolute 4.50 x10 3/uL      Lymphocytes Absolute Automated 2.31 x10 3/uL      Monocytes Absolute Automated 1.01 x10 3/uL      Eosinophils Absolute Automated 0.00 x10 3/uL      Basophils Absolute Automated 0.03 x10 3/uL      Immature Granulocytes Absolute 0.01 x10 3/uL      Absolute NRBC 0.00 x10 3/uL     Comprehensive metabolic panel [[id number]]  (Abnormal) Collected: 09/13/22 2150    Specimen: Blood Updated: 09/13/22 2220      Glucose 123 mg/dL      BUN 95.6 mg/dL      Creatinine 2.1 mg/dL      Sodium 213 mEq/L      Potassium 3.8 mEq/L      Chloride 92 mEq/L      CO2 24 mEq/L      Calcium 9.6 mg/dL      Protein, Total 9.0 g/dL      Albumin 5.0 g/dL      AST (SGOT) 82 U/L      ALT 99 U/L      Alkaline Phosphatase 59 U/L      Bilirubin, Total 1.9 mg/dL      Globulin 4.0 g/dL      Albumin/Globulin Ratio 1.3     Anion Gap 12.0     eGFR 42.3 mL/min/1.73 m2     Lipase [[id number]] Collected: 09/13/22 2150    Specimen: Blood Updated: 09/13/22 2220     Lipase 35 U/L     Ethanol (Alcohol) Level [[id number]] Collected: 09/13/22 2150    Specimen: Blood Updated: 09/13/22 2216     Alcohol NONE DETECTED mg/dL               Hospital Course:   48M h/o Marijuana use with recent admission to [facility] for Hyperemesis syndrome here with persistent N/V found to have severe prerenal AKI and HypoNa     AKI,  resolved  -Likely due to severe pre-renal AKI  -IVF rehydration during hospital course  -Baseline Cr likely 1.2 per chart review  -Cr on discharge 1.3    Hyponatremia, moderate. Improved  -Likely due to volume depletion and poor PO intake due to persistent Nausea and vomiting  -Encourage PO intake  -Improved with IVF hydration  -continue regular diet on discharge    Hypokalemia  -Replenished prior to discharge     Hemoconcentration, resolved  Hx of Hyperemesis syndrome  --Likely due to volume depletion and poor PO intake due to persistent Nausea and vomiting  -Continue   -antiemetics and trial of capsaicin oint (reports last marijuana was on wed/thurs)  -short trial of Protonix and Carafate for presumed gastritis  -Advance diet as tolerated  -UDS: positive for cannabinoid  -Educated on importance on cessation of weed use. Verbalized understanding     Transaminitis  -Elevated AST, ALT, T. Bili  -AST and ALT are mainly unchanged since 2022. T. Bili is mild increased from 2022  -in 2022: HIV negative, Hepatitis panel negative  -HIV and Hepatitis panel ordered  on admission: Negative  -Continue to monitor with PCP      01/16: No acute events overnight.  No acute events since admission.  Patient reports that his nausea and vomiting has resolved.  Denies any abdominal pain.  He is tolerating p.o.  No acute concerns.  Feels ready to go home today.  Discussed importance of cessation of wound use.  Patient verbalized understanding and agrees.  Patient otherwise medically stable to be discharged home.    DISCHARGE DAY EXAM:  Patient Vitals for the past 24 hrs:   BP Temp Temp src Pulse Resp SpO2 Height Weight   09/14/22 1104 136/84 98.4 F (36.9 C) Axillary 94 16 100 % -- --   09/14/22 0846 151/70 99 F (37.2 C) Oral 80 17 99 % -- --   09/14/22 0544 (!) 173/108 98.5 F (36.9 C) Oral 95 20 100 % -- --   09/13/22 2337 123/71 98.5 F (36.9 C) Oral 95 20 100 % -- --   09/13/22 2245 138/83 -- -- (!) 107 (!) 23 96 % -- --   09/13/22 2202 125/83 -- -- (!) 102 20 100 % -- --   09/13/22 2132 106/80 97.9 F (36.6 C) Oral (!) 118 18 99 % 1.753 m (5\' 9" ) 56.7 kg (125 lb)   09/13/22 2131 -- -- -- (!) 129 -- 97 % -- --     Body mass index is 18.46 kg/m.  General: awake, alert, no acute distress.  Cardiovascular: regular rate and rhythm  Lungs: room air, normal respiratory effort, clear to auscultation bilaterally  Abdomen: soft, non-tender, non-distended; no palpable masses, normoactive bowel sounds, no rebound or guarding  Extremities: no clubbing, cyanosis, or edema  Neuro: Facial symmetry present, steady gait    Discharge Medications:        Medication List        CONTINUE taking these medications      capsaicin 0.025 % cream  Commonly known as: ZOSTRIX  Apply topically 4 (four) times daily as needed (cannabinoid hyperemsis syndrome)     ondansetron 4 MG tablet  Commonly known as: ZOFRAN  Take 1 tablet (4 mg total) by mouth every 8 (eight) hours as needed for Nausea     sertraline 25 MG tablet  Commonly known as: ZOLOFT              Discharge Instructions:   Patient was  instructed to  follow up with:         Patient's emergency contact: Extended Emergency Contact Information  Primary Emergency Contact: Nicole [place]  Home Phone: [phone]  Mobile Phone: [phone]  Relation: Mother     Complete instructions and follow up are in the patient's After Visit Summary    Minutes spent coordinating discharge and reviewing discharge plan: 50 minutes      Signed by: Lincoln Brigham, MD, MD    CC: Joel Lager, MD      Dictation disclaimer   -Portions of this record have been created with voice recognition software and as such,   Unanticipated grammatical, syntax, homophones, and other interpretive errors can be   Inadvertently transcribed. Please disregard these errors. Please excuse any errors that have  Escaped final proofreading.

## 2022-09-14 NOTE — UM Notes (Signed)
PATIENT NAME: [name]   DOB: 22-Dec-1990     Admission Order Admit to Observation (Order #[id number]) on 09/13/22    Admit Diagnosis: Dehydration [E86.0]  AKI (acute kidney injury) [N17.9]  Facility: [facility]    Reason for Admission 19M h/o Marijuana use with recent admission to [facility] for Hyperemesis syndrome here with persistent N/V found to have severe prerenal AKI and HypoNa being admitted to Obs.  He was discharged from hospital on Friday with script for Reglan.  Had difficult weekend with minimal oral intake and N/V.  Feels weak and dehydration at time of evaluation.    Vital Signs  09/13/22 2132 97.9 F (36.6 C) 118 Abnormal  18 106/80 99 % -- 56.7 kg (125 lb)   09/13/22 2131 -- 129 Abnormal  -- -- 97 %         Medications  Scheduled Meds:  Current Facility-Administered Medications   Medication Dose Route Frequency    capsaicin   Topical QID    pantoprazole  40 mg Intravenous Daily    potassium chloride  40 mEq Oral Once    potassium chloride  10 mEq Intravenous Once     Continuous Infusions:   sodium chloride 200 mL/hr at 09/14/22 1212     Abnormal Labs   09/13/22 21:50   Hemoglobin 18.5 (H)   Hematocrit 50.1 (H)   MCHC 36.9 (H)   Monocytes Absolute Automated 1.01 (H)     Glucose 123 (H)   BUN 56.0 (H)   Creatinine 2.1 (H)   Sodium 128 (L)   Chloride 92 (L)   eGFR 42.3 !   AST 82 (H)   ALT 99 (H)   Protein Total 9.0 (H)   Globulin 4.0 (H)   Bilirubin Total 1.9 (H)     Plan of Care  AKI  HypoNa  Hemoconcentration  Hx of Hyperemesis syndrome  -start aggressive IV resuscitation  -antiemetics and trial of capsaicin oint (reports last marijuana was on wed/thurs)  -short trial of Protonix and Carafate for presumed gastritis  -clears for now        Patient has BMI=Body mass index is 18.46 kg/m.  Diagnosis: Underweight        Primary Coverage:  Payor: MEDICAID HMO / Plan: MEDSTAR FAMILY CHOICE MD MEDICAID MCO / Product Type: MANAGED MEDICAID /     UTILIZATION  REVIEW CONTACT: Laurice Record, RN  Utilization Review   [place]  [phone]  [phone]  Email: [email or web address]  Tax ID:  [id number]         NOTES TO REVIEWER:    This clinical review is based on/compiled from documentation provided by the treatment team within the patient's medical record.

## 2022-09-14 NOTE — Progress Notes (Signed)
Pt discharged to home Via tax pt symptom resolved. IV removed d/c instructions explained pt verbalized understanding of d/c instruction. Pt received all his belongs prior to d/c home.

## 2022-09-14 NOTE — Plan of Care (Signed)
Problem: Pain interferes with ability to perform ADL  Goal: Pain at adequate level as identified by patient  Outcome: Completed     Problem: Side Effects from Pain Analgesia  Goal: Patient will experience minimal side effects of analgesic therapy  Outcome: Completed     Problem: Compromised Sensory Perception  Goal: Sensory Perception Interventions  Outcome: Completed

## 2022-09-14 NOTE — Progress Notes (Signed)
Initial Case Management Assessment and Discharge [place]   Patient Name: [name]   Date of Birth 1991-03-17   Attending Physician: Elmo Putt, MD   Primary Care Physician: Junius Roads, MD   Length of Stay 0   Reason for Consult / Chief Complaint Emesis, nausea,abdominal pain.        Situation   Admission DX:   1. AKI (acute kidney injury)    2. Dehydration        A/O Status: X 3    LACE Score: 6    Patient admitted from: ER  Admission Status: observation    [place]: Parent  Name: Joel [name]  Phone number: [phone]       Background     Advanced directive:   <no information>    has NO advance directive     Code Status:   Full Code     Residence: Multi-story home    PCP: PCP Not on File, MD  Patient Contact:   [phone] (home)     [phone] (mobile)     Emergency contact:   Extended Emergency Contact Information  Primary Emergency Contact: [place] Phone: [phone]  Mobile Phone: [phone]  Relation: Mother      ADL/IADL's: Independent  Previous Level of function: 7 Independent     DME: None    Pharmacy:     [facility] [place] [id number]  Phone: [phone] Fax: [phone]      Prescription Coverage: Yes    Home Health: The patient is not currently receiving home health services.    Previous SNF/AR: n/a    COVID Vaccine Status: unknown    Date First IMM given: n/a  UAI on file?: No  Transport for discharge? Mode of transportation: Sports coach - Family/Friend to drive patient  Agreeable to Home with family post-discharge:  Yes     Assessment   Patient lives in 2 story home with mother, 8ste. Patient states the home has a bedroom and bathroom on the first level as well. Patient is independent at baseline, no DMEs. No history of HH use. Patient states his mother will transport him at the time of [place]. Will follow for  needs.   BARRIERS TO DISCHARGE: none      Recommendation   D/C Plan A: Home with family    D/C Plan B: Home with family    D/C Plan C: Home with family          09/14/22 1230   Financial Resource Strain   How hard is it for you to pay for the very basics like food, housing, medical care, and heating? Somewhat   Housing Stability   In the last 12 months, was there a time when you were not able to pay the mortgage or rent on time? N   In the last 12 months, how many places have you lived? 1   In the last 12 months, was there a time when you did not have a steady place to sleep or slept in a shelter (including now)? N   Transportation Needs   In the past 12 months, has lack of transportation kept you from medical appointments or from getting medications? no   In the past 12 months, has lack of transportation kept you from meetings, work, or from getting things needed for daily living? No     Lizann Edelman  Reeves Forth, RN Case Manager

## 2022-09-24 NOTE — Progress Notes (Signed)
[facility] patient admitted to: [facility] Admission/Discharge Dates:   1/15 to 1/16     Reason for admission to hospital: AKI    Name: Joel [name]    ### Patient Details  Date of Birth: [birth year]  MRN: [id number]    ### Encounter Details  Arrival Date: 09/13/2022 09:51 PM EST  Discharge Date: 09/14/2022 05:36 PM EST  Encounter ID: [id number] TCM1/16/2024   5:36:00PM    ### Related interaction  [facility] Discharge Outreach (Post Discharge TCM Outreach 1) ([email or web address])    ### Required Interventions and Feedback     Call Status         Call Status:     Attempt 3 (edited by DN on 09/24/2022 11:41 AM EST)    Voicemail Left:     Yes (edited by DN on 09/24/2022 11:41 AM EST)    Comments::     Writer contacted patient for hospital follow up and left a voicemail (edited by DN on 09/24/2022 11:42 AM EST)    Pauline Aus, ACM-SW  Ambulatory Case Manager  [facility] for [place], [place], [place] [address]  [place]  [email or web address]
